# Patient Record
Sex: Male | Born: 1975
Health system: Southern US, Community
[De-identification: ages and names within clinical notes are randomized; demographics above are authoritative.]

## PROBLEM LIST (undated history)

## (undated) DIAGNOSIS — F419 Anxiety disorder, unspecified: Secondary | ICD-10-CM

## (undated) DIAGNOSIS — F1010 Alcohol abuse, uncomplicated: Secondary | ICD-10-CM

## (undated) DIAGNOSIS — N289 Disorder of kidney and ureter, unspecified: Secondary | ICD-10-CM

## (undated) DIAGNOSIS — J9600 Acute respiratory failure, unspecified whether with hypoxia or hypercapnia: Secondary | ICD-10-CM

## (undated) DIAGNOSIS — K219 Gastro-esophageal reflux disease without esophagitis: Secondary | ICD-10-CM

## (undated) DIAGNOSIS — E669 Obesity, unspecified: Secondary | ICD-10-CM

## (undated) DIAGNOSIS — I509 Heart failure, unspecified: Secondary | ICD-10-CM

## (undated) DIAGNOSIS — R739 Hyperglycemia, unspecified: Secondary | ICD-10-CM

## (undated) DIAGNOSIS — Z87820 Personal history of traumatic brain injury: Secondary | ICD-10-CM

## (undated) DIAGNOSIS — J45909 Unspecified asthma, uncomplicated: Secondary | ICD-10-CM

## (undated) DIAGNOSIS — G4730 Sleep apnea, unspecified: Secondary | ICD-10-CM

## (undated) DIAGNOSIS — J449 Chronic obstructive pulmonary disease, unspecified: Secondary | ICD-10-CM

## (undated) DIAGNOSIS — M2241 Chondromalacia patellae, right knee: Secondary | ICD-10-CM

## (undated) DIAGNOSIS — K2970 Gastritis, unspecified, without bleeding: Secondary | ICD-10-CM

## (undated) DIAGNOSIS — J410 Simple chronic bronchitis: Secondary | ICD-10-CM

## (undated) DIAGNOSIS — M23009 Cystic meniscus, unspecified meniscus, unspecified knee: Secondary | ICD-10-CM

## (undated) DIAGNOSIS — E119 Type 2 diabetes mellitus without complications: Secondary | ICD-10-CM

## (undated) DIAGNOSIS — I10 Essential (primary) hypertension: Secondary | ICD-10-CM

## (undated) HISTORY — PX: TONSILLECTOMY AND ADENOIDECTOMY: SHX28

## (undated) HISTORY — PX: WISDOM TOOTH EXTRACTION: SHX21

## (undated) HISTORY — PX: TONSILLECTOMY: SUR1361

## (undated) HISTORY — PX: APPENDECTOMY: SHX54

---

## 1999-02-10 ENCOUNTER — Encounter: Payer: Self-pay | Admitting: Internal Medicine

## 1999-02-10 ENCOUNTER — Inpatient Hospital Stay (HOSPITAL_COMMUNITY): Admission: EM | Admit: 1999-02-10 | Discharge: 1999-02-11 | Payer: Self-pay | Admitting: Gastroenterology

## 2000-03-11 ENCOUNTER — Inpatient Hospital Stay (HOSPITAL_COMMUNITY): Admission: EM | Admit: 2000-03-11 | Discharge: 2000-03-13 | Payer: Self-pay | Admitting: Family Medicine

## 2003-11-13 ENCOUNTER — Emergency Department (HOSPITAL_COMMUNITY): Admission: EM | Admit: 2003-11-13 | Discharge: 2003-11-13 | Payer: Self-pay | Admitting: Emergency Medicine

## 2004-06-05 ENCOUNTER — Emergency Department (HOSPITAL_COMMUNITY): Admission: EM | Admit: 2004-06-05 | Discharge: 2004-06-05 | Payer: Self-pay | Admitting: Emergency Medicine

## 2004-10-10 DIAGNOSIS — Z87820 Personal history of traumatic brain injury: Secondary | ICD-10-CM

## 2004-10-10 HISTORY — DX: Personal history of traumatic brain injury: Z87.820

## 2004-12-26 ENCOUNTER — Emergency Department (HOSPITAL_COMMUNITY): Admission: EM | Admit: 2004-12-26 | Discharge: 2004-12-26 | Payer: Self-pay | Admitting: Emergency Medicine

## 2005-09-17 ENCOUNTER — Emergency Department (HOSPITAL_COMMUNITY): Admission: EM | Admit: 2005-09-17 | Discharge: 2005-09-17 | Payer: Self-pay | Admitting: Emergency Medicine

## 2005-09-18 ENCOUNTER — Emergency Department (HOSPITAL_COMMUNITY): Admission: EM | Admit: 2005-09-18 | Discharge: 2005-09-18 | Payer: Self-pay | Admitting: Emergency Medicine

## 2005-09-22 ENCOUNTER — Emergency Department (HOSPITAL_COMMUNITY): Admission: EM | Admit: 2005-09-22 | Discharge: 2005-09-22 | Payer: Self-pay | Admitting: Emergency Medicine

## 2005-09-30 ENCOUNTER — Emergency Department (HOSPITAL_COMMUNITY): Admission: EM | Admit: 2005-09-30 | Discharge: 2005-09-30 | Payer: Self-pay | Admitting: Emergency Medicine

## 2006-08-21 ENCOUNTER — Emergency Department (HOSPITAL_COMMUNITY): Admission: EM | Admit: 2006-08-21 | Discharge: 2006-08-21 | Payer: Self-pay | Admitting: Emergency Medicine

## 2007-09-04 ENCOUNTER — Emergency Department (HOSPITAL_COMMUNITY): Admission: EM | Admit: 2007-09-04 | Discharge: 2007-09-04 | Payer: Self-pay | Admitting: Emergency Medicine

## 2008-01-12 ENCOUNTER — Emergency Department (HOSPITAL_COMMUNITY): Admission: EM | Admit: 2008-01-12 | Discharge: 2008-01-12 | Payer: Self-pay | Admitting: Emergency Medicine

## 2008-01-13 ENCOUNTER — Emergency Department (HOSPITAL_COMMUNITY): Admission: EM | Admit: 2008-01-13 | Discharge: 2008-01-13 | Payer: Self-pay | Admitting: Emergency Medicine

## 2008-02-26 ENCOUNTER — Emergency Department (HOSPITAL_COMMUNITY): Admission: EM | Admit: 2008-02-26 | Discharge: 2008-02-26 | Payer: Self-pay | Admitting: Emergency Medicine

## 2008-02-27 ENCOUNTER — Inpatient Hospital Stay (HOSPITAL_COMMUNITY): Admission: AD | Admit: 2008-02-27 | Discharge: 2008-03-01 | Payer: Self-pay | Admitting: Orthopedic Surgery

## 2008-02-27 HISTORY — PX: IRRIGATION AND DEBRIDEMENT ABSCESS: SHX5252

## 2008-02-27 HISTORY — PX: TENOSYNOVECTOMY: SHX6110

## 2008-02-27 HISTORY — PX: FOREIGN BODY REMOVAL: SHX962

## 2008-03-01 HISTORY — PX: INCISION AND DRAINAGE ABSCESS: SHX5864

## 2009-03-20 ENCOUNTER — Ambulatory Visit: Payer: Self-pay | Admitting: Diagnostic Radiology

## 2009-03-20 ENCOUNTER — Emergency Department (HOSPITAL_BASED_OUTPATIENT_CLINIC_OR_DEPARTMENT_OTHER): Admission: EM | Admit: 2009-03-20 | Discharge: 2009-03-20 | Payer: Self-pay | Admitting: Emergency Medicine

## 2009-03-26 ENCOUNTER — Emergency Department (HOSPITAL_BASED_OUTPATIENT_CLINIC_OR_DEPARTMENT_OTHER): Admission: EM | Admit: 2009-03-26 | Discharge: 2009-03-26 | Payer: Self-pay | Admitting: Emergency Medicine

## 2009-03-26 ENCOUNTER — Ambulatory Visit: Payer: Self-pay | Admitting: Radiology

## 2009-07-23 ENCOUNTER — Ambulatory Visit: Payer: Self-pay | Admitting: Diagnostic Radiology

## 2009-07-23 ENCOUNTER — Emergency Department (HOSPITAL_BASED_OUTPATIENT_CLINIC_OR_DEPARTMENT_OTHER): Admission: EM | Admit: 2009-07-23 | Discharge: 2009-07-23 | Payer: Self-pay | Admitting: Emergency Medicine

## 2009-11-20 ENCOUNTER — Ambulatory Visit: Payer: Self-pay | Admitting: Diagnostic Radiology

## 2009-11-20 ENCOUNTER — Emergency Department (HOSPITAL_BASED_OUTPATIENT_CLINIC_OR_DEPARTMENT_OTHER): Admission: EM | Admit: 2009-11-20 | Discharge: 2009-11-20 | Payer: Self-pay | Admitting: Emergency Medicine

## 2009-11-30 ENCOUNTER — Ambulatory Visit: Payer: Self-pay | Admitting: Diagnostic Radiology

## 2009-11-30 ENCOUNTER — Emergency Department (HOSPITAL_BASED_OUTPATIENT_CLINIC_OR_DEPARTMENT_OTHER): Admission: EM | Admit: 2009-11-30 | Discharge: 2009-11-30 | Payer: Self-pay | Admitting: Emergency Medicine

## 2010-10-06 ENCOUNTER — Emergency Department (HOSPITAL_BASED_OUTPATIENT_CLINIC_OR_DEPARTMENT_OTHER)
Admission: EM | Admit: 2010-10-06 | Discharge: 2010-10-06 | Payer: Self-pay | Source: Home / Self Care | Admitting: Emergency Medicine

## 2010-12-20 LAB — COMPREHENSIVE METABOLIC PANEL
Albumin: 4.6 g/dL (ref 3.5–5.2)
CO2: 26 mEq/L (ref 19–32)
Chloride: 105 mEq/L (ref 96–112)
Creatinine, Ser: 1.2 mg/dL (ref 0.4–1.5)
Glucose, Bld: 170 mg/dL — ABNORMAL HIGH (ref 70–99)
Total Bilirubin: 0.6 mg/dL (ref 0.3–1.2)
Total Protein: 7.9 g/dL (ref 6.0–8.3)

## 2010-12-20 LAB — DIFFERENTIAL
Basophils Absolute: 0 10*3/uL (ref 0.0–0.1)
Basophils Relative: 0 % (ref 0–1)
Lymphocytes Relative: 6 % — ABNORMAL LOW (ref 12–46)
Lymphs Abs: 1.1 10*3/uL (ref 0.7–4.0)
Neutro Abs: 14.3 10*3/uL — ABNORMAL HIGH (ref 1.7–7.7)

## 2010-12-20 LAB — CBC
HCT: 47 % (ref 39.0–52.0)
Hemoglobin: 16.6 g/dL (ref 13.0–17.0)
RDW: 12.7 % (ref 11.5–15.5)
WBC: 16.7 10*3/uL — ABNORMAL HIGH (ref 4.0–10.5)

## 2010-12-29 LAB — COMPREHENSIVE METABOLIC PANEL
ALT: 24 U/L (ref 0–53)
ALT: 25 U/L (ref 0–53)
AST: 18 U/L (ref 0–37)
Albumin: 4.6 g/dL (ref 3.5–5.2)
Albumin: 4.8 g/dL (ref 3.5–5.2)
Alkaline Phosphatase: 85 U/L (ref 39–117)
BUN: 14 mg/dL (ref 6–23)
BUN: 15 mg/dL (ref 6–23)
Calcium: 9.5 mg/dL (ref 8.4–10.5)
Chloride: 103 mEq/L (ref 96–112)
Chloride: 104 mEq/L (ref 96–112)
GFR calc Af Amer: >60 mL/min (ref >60)
GFR calc non Af Amer: >60 mL/min (ref >60)
GFR calc non Af Amer: >60 mL/min (ref >60)
Glucose, Bld: 154 mg/dL — ABNORMAL HIGH (ref 70–99)
Potassium: 4.1 mEq/L (ref 3.5–5.1)
Total Protein: 8.1 g/dL (ref 6.0–8.3)

## 2010-12-29 LAB — DIFFERENTIAL
Basophils Absolute: 0 10*3/uL (ref 0.0–0.1)
Basophils Absolute: 0.1 10*3/uL (ref 0.0–0.1)
Lymphocytes Relative: 10 % — ABNORMAL LOW (ref 12–46)
Lymphs Abs: 1.2 10*3/uL (ref 0.7–4.0)
Lymphs Abs: 2.1 10*3/uL (ref 0.7–4.0)
Monocytes Absolute: 0.6 10*3/uL (ref 0.1–1.0)
Monocytes Relative: 5 % (ref 3–12)
Neutro Abs: 10.9 10*3/uL — ABNORMAL HIGH (ref 1.7–7.7)
Neutro Abs: 9.8 10*3/uL — ABNORMAL HIGH (ref 1.7–7.7)
Neutrophils Relative %: 79 % — ABNORMAL HIGH (ref 43–77)
Neutrophils Relative %: 85 % — ABNORMAL HIGH (ref 43–77)

## 2010-12-29 LAB — CBC
Hemoglobin: 16.6 g/dL (ref 13.0–17.0)
MCHC: 35 g/dL (ref 30.0–36.0)
MCV: 88.5 fL (ref 78.0–100.0)
Platelets: 289 10*3/uL (ref 150–400)
RBC: 5.3 MIL/uL (ref 4.22–5.81)
RBC: 5.35 MIL/uL (ref 4.22–5.81)
RDW: 12 % (ref 11.5–15.5)
RDW: 12 % (ref 11.5–15.5)
WBC: 11.6 10*3/uL — ABNORMAL HIGH (ref 4.0–10.5)

## 2010-12-29 LAB — URINE MICROSCOPIC-ADD ON

## 2010-12-29 LAB — URINALYSIS, ROUTINE W REFLEX MICROSCOPIC
Bilirubin Urine: NEGATIVE
Glucose, UA: NEGATIVE mg/dL
Hgb urine dipstick: NEGATIVE
Protein, ur: NEGATIVE mg/dL
Specific Gravity, Urine: 1.025 (ref 1.005–1.030)
Specific Gravity, Urine: 1.031 — ABNORMAL HIGH (ref 1.005–1.030)
pH: 7.5 (ref 5.0–8.0)

## 2010-12-29 LAB — LIPASE, BLOOD: Lipase: 63 U/L (ref 23–300)

## 2011-01-13 LAB — COMPREHENSIVE METABOLIC PANEL
ALT: 22 U/L (ref 0–53)
Albumin: 5 g/dL (ref 3.5–5.2)
Alkaline Phosphatase: 82 U/L (ref 39–117)
Chloride: 105 mEq/L (ref 96–112)
Glucose, Bld: 144 mg/dL — ABNORMAL HIGH (ref 70–99)
Potassium: 3.8 mEq/L (ref 3.5–5.1)
Sodium: 146 mEq/L — ABNORMAL HIGH (ref 135–145)
Total Protein: 8 g/dL (ref 6.0–8.3)

## 2011-01-13 LAB — URINALYSIS, ROUTINE W REFLEX MICROSCOPIC
Bilirubin Urine: NEGATIVE
Glucose, UA: NEGATIVE mg/dL
Ketones, ur: >80 mg/dL — AB
pH: 7 (ref 5.0–8.0)

## 2011-01-13 LAB — ETHANOL: Alcohol, Ethyl (B): 20 mg/dL — ABNORMAL HIGH (ref 0–10)

## 2011-01-13 LAB — POCT TOXICOLOGY PANEL

## 2011-01-13 LAB — CBC
Hemoglobin: 16.4 g/dL (ref 13.0–17.0)
Platelets: 285 10*3/uL (ref 150–400)
RDW: 11.9 % (ref 11.5–15.5)
WBC: 17.9 10*3/uL — ABNORMAL HIGH (ref 4.0–10.5)

## 2011-01-13 LAB — DIFFERENTIAL
Basophils Relative: 1 % (ref 0–1)
Eosinophils Absolute: 0 10*3/uL (ref 0.0–0.7)
Lymphs Abs: 1.1 10*3/uL (ref 0.7–4.0)
Monocytes Absolute: 0.7 10*3/uL (ref 0.1–1.0)
Neutro Abs: 15.9 10*3/uL — ABNORMAL HIGH (ref 1.7–7.7)

## 2011-01-13 LAB — URINE MICROSCOPIC-ADD ON

## 2011-02-22 IMAGING — CR DG RIBS W/ CHEST 3+V*L*
4 series · 4 of 4 positions shown · non-contrast
Comparison: Chest x-ray dated 10/04/2007

CLINICAL DATA: The patient fell yesterday and has abrasions
inferior to the left nipple

LEFT RIBS AND CHEST - 3+ VIEW

[w chest pa]
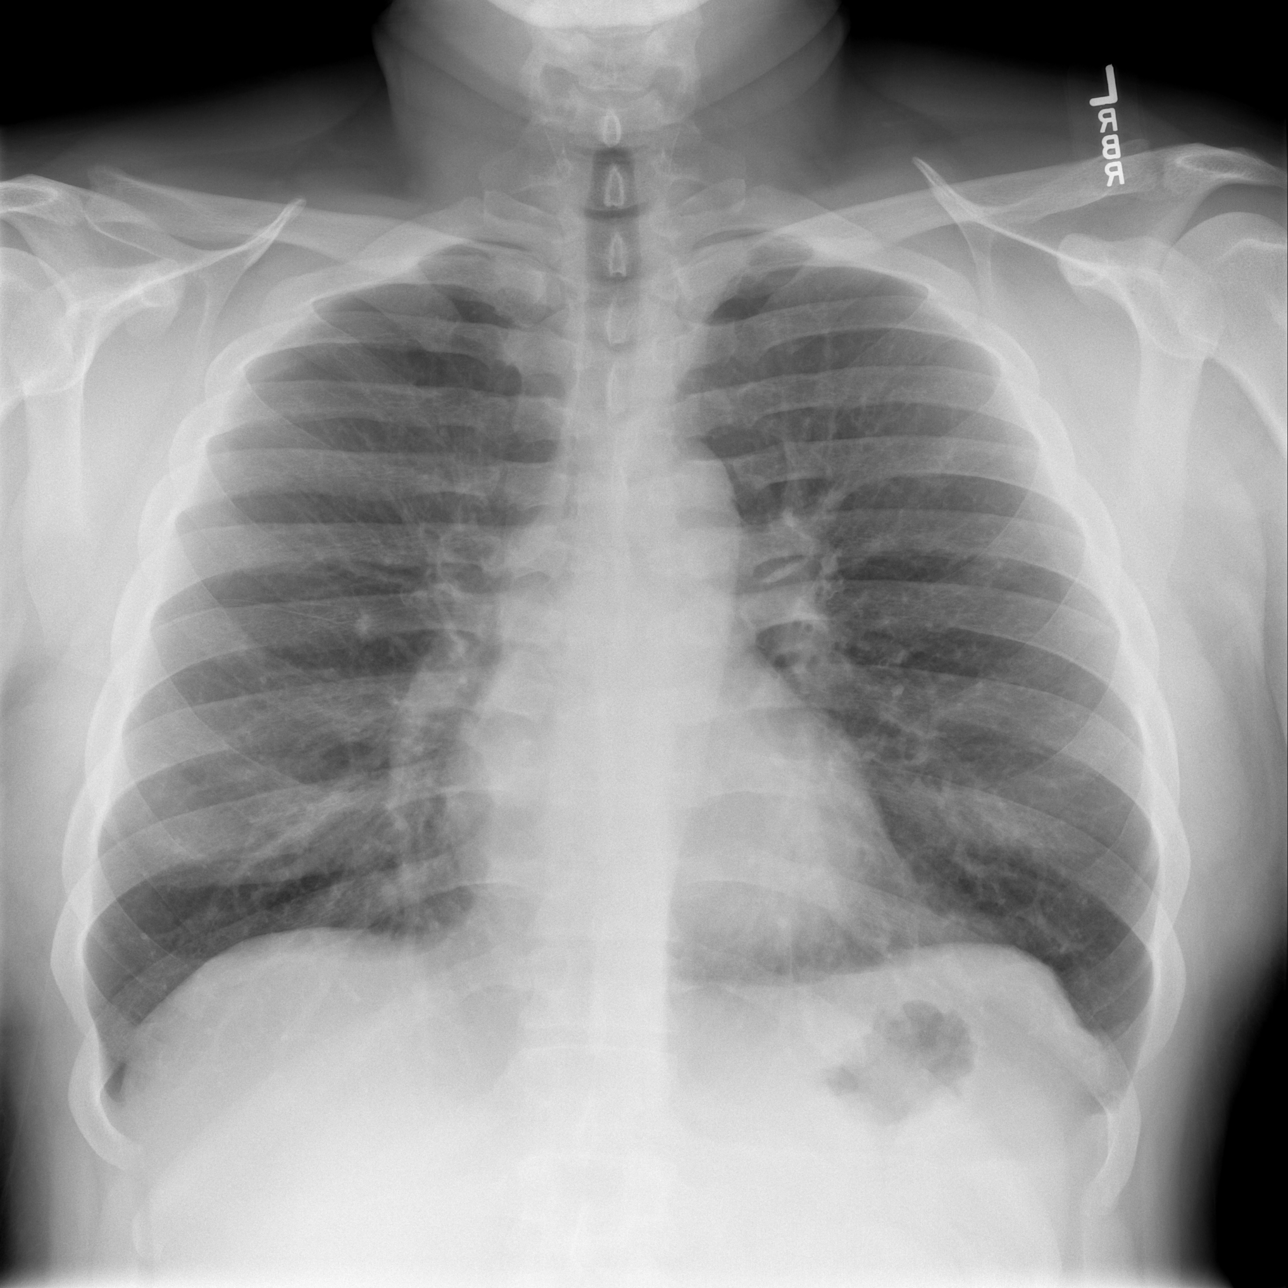

[w ribs ap/pa upper left]
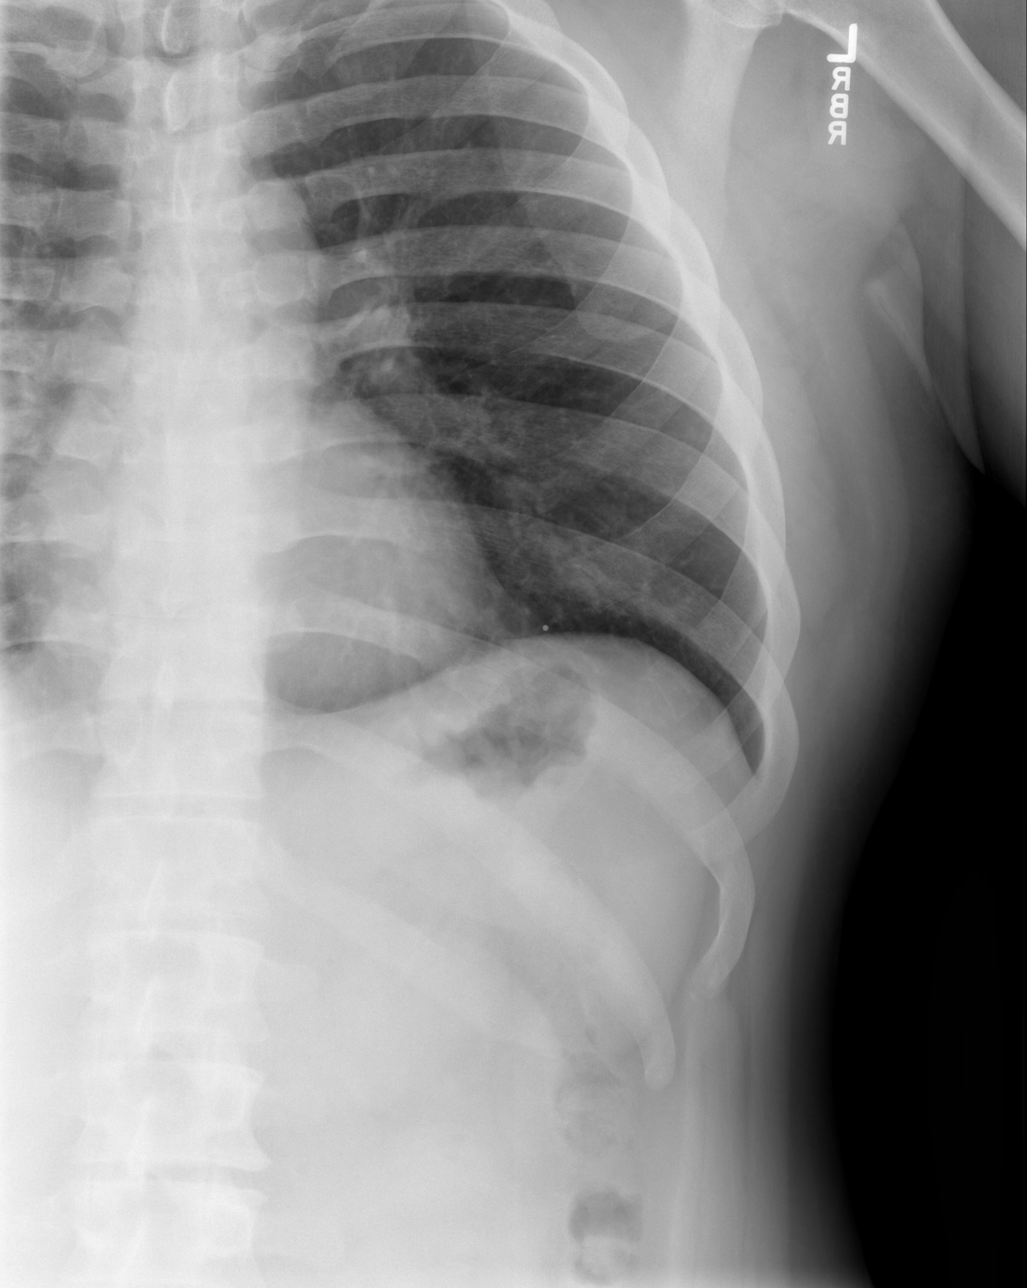

[w ribs oblique left (1 of 2)]
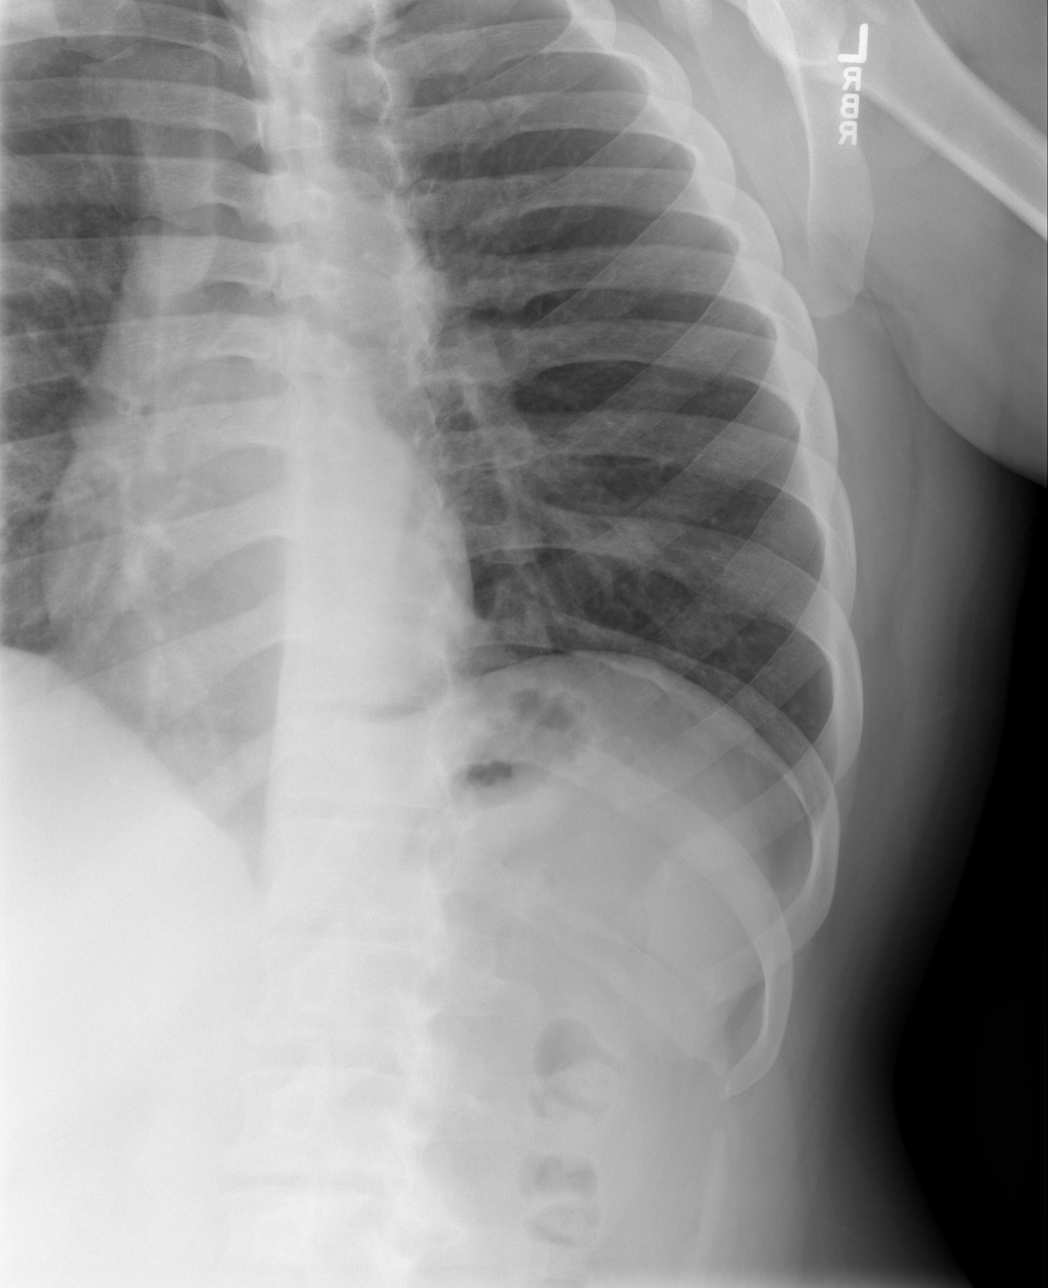

[w ribs oblique left (2 of 2)]
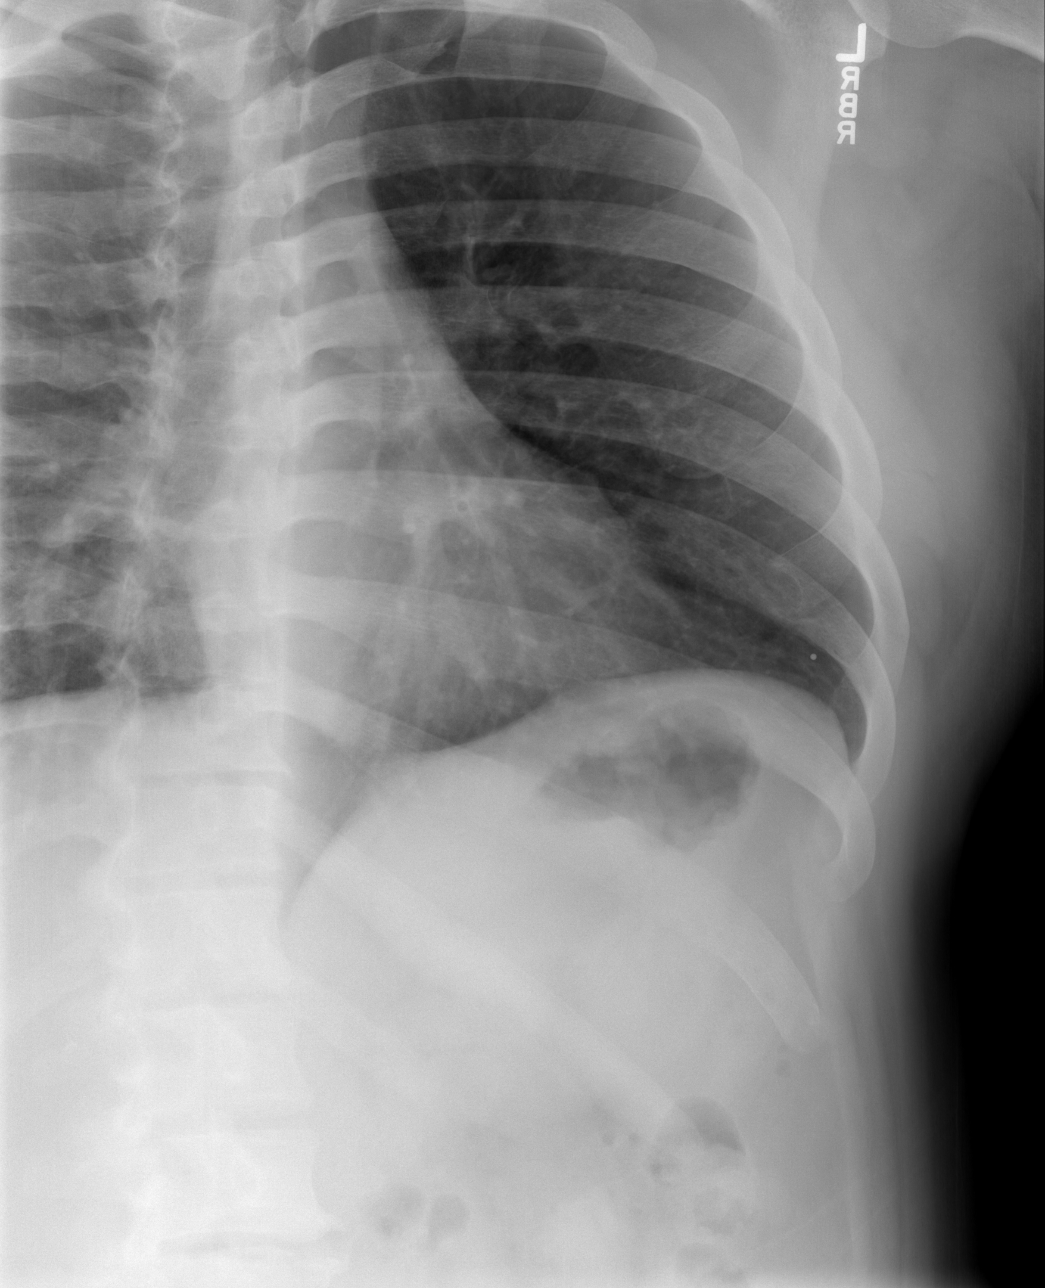

[4 of 4 positions shown; findings below may reference images not displayed]

FINDINGS: There is no evidence of rib fracture, lung contusion,
pneumothorax, or other significant abnormality. The heart size and
vascularity are normal and the lungs are clear.
IMPRESSION: Normal chest and left ribs.

## 2011-02-22 IMAGING — CR DG KNEE COMPLETE 4+V*L*
4 series · 4 of 4 positions shown · non-contrast
Comparison: None

CLINICAL DATA: Left knee pain with laceration and puncture wounds
to the anterior aspect of the knee.  Limited range of motion.  The
patient fell yesterday.

LEFT KNEE - COMPLETE 4+ VIEW

[t knee lat left (1 of 2)]
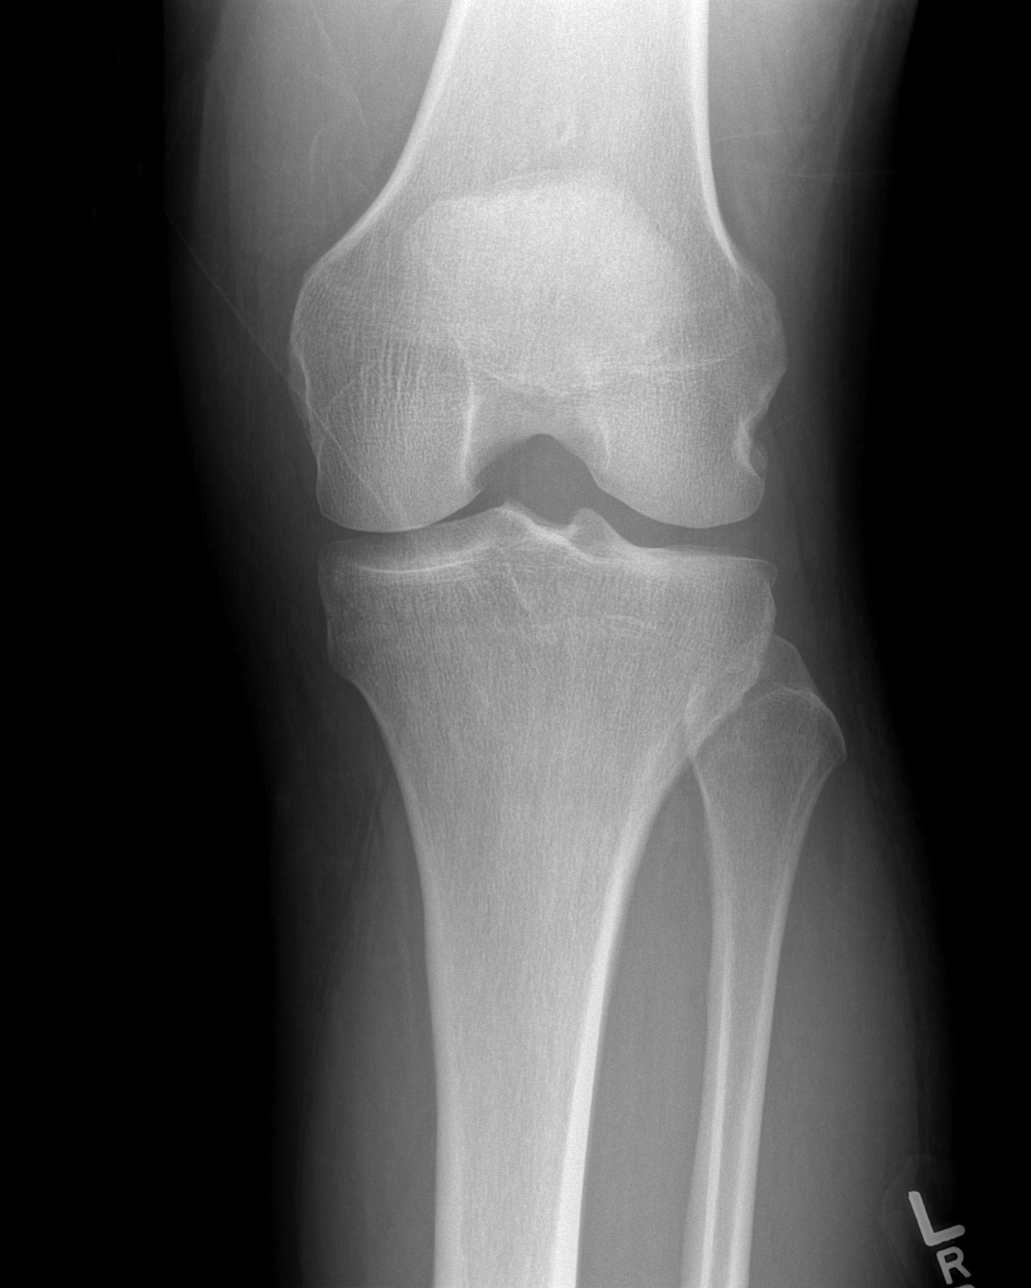

[t knee oblique left (1 of 2)]
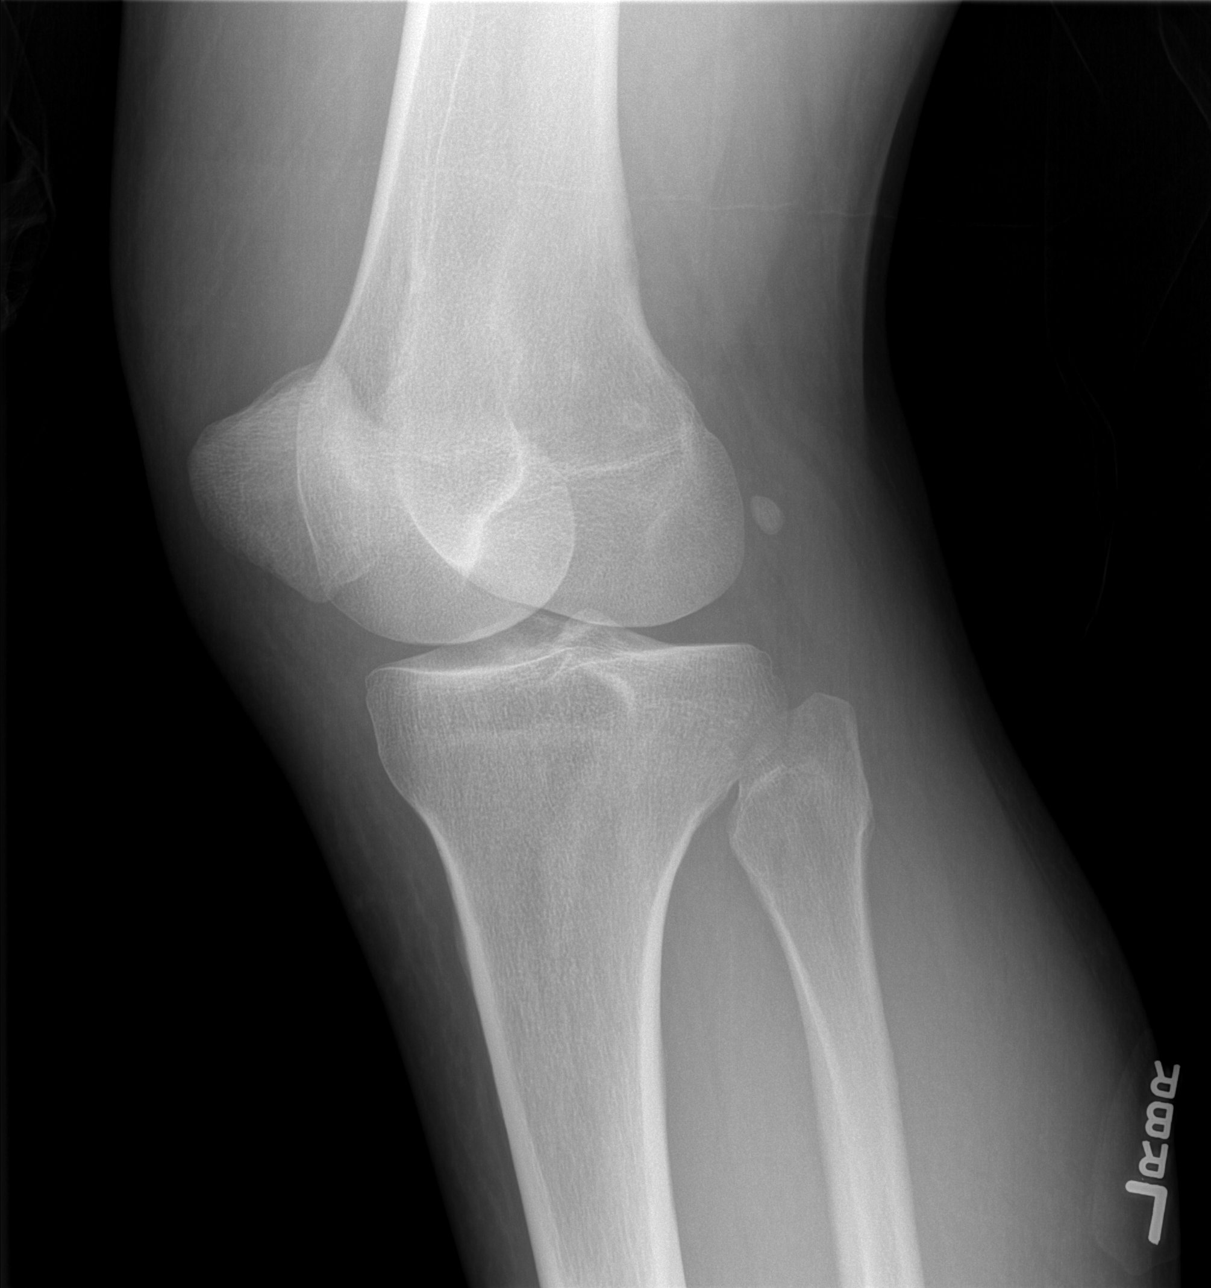

[t knee oblique left (2 of 2)]
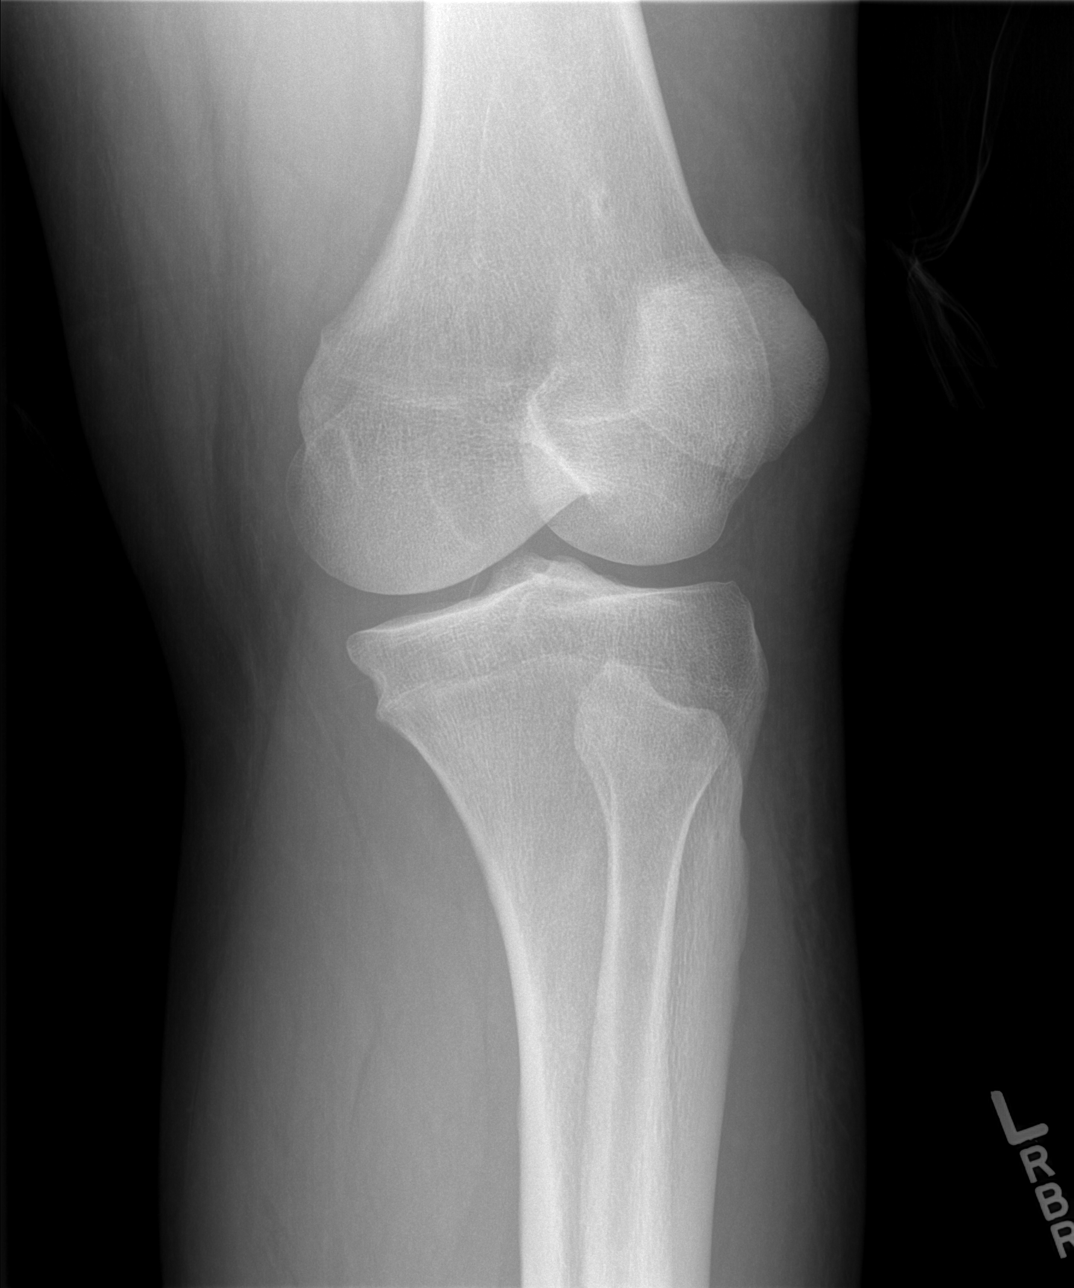

[t knee lat left (2 of 2)]
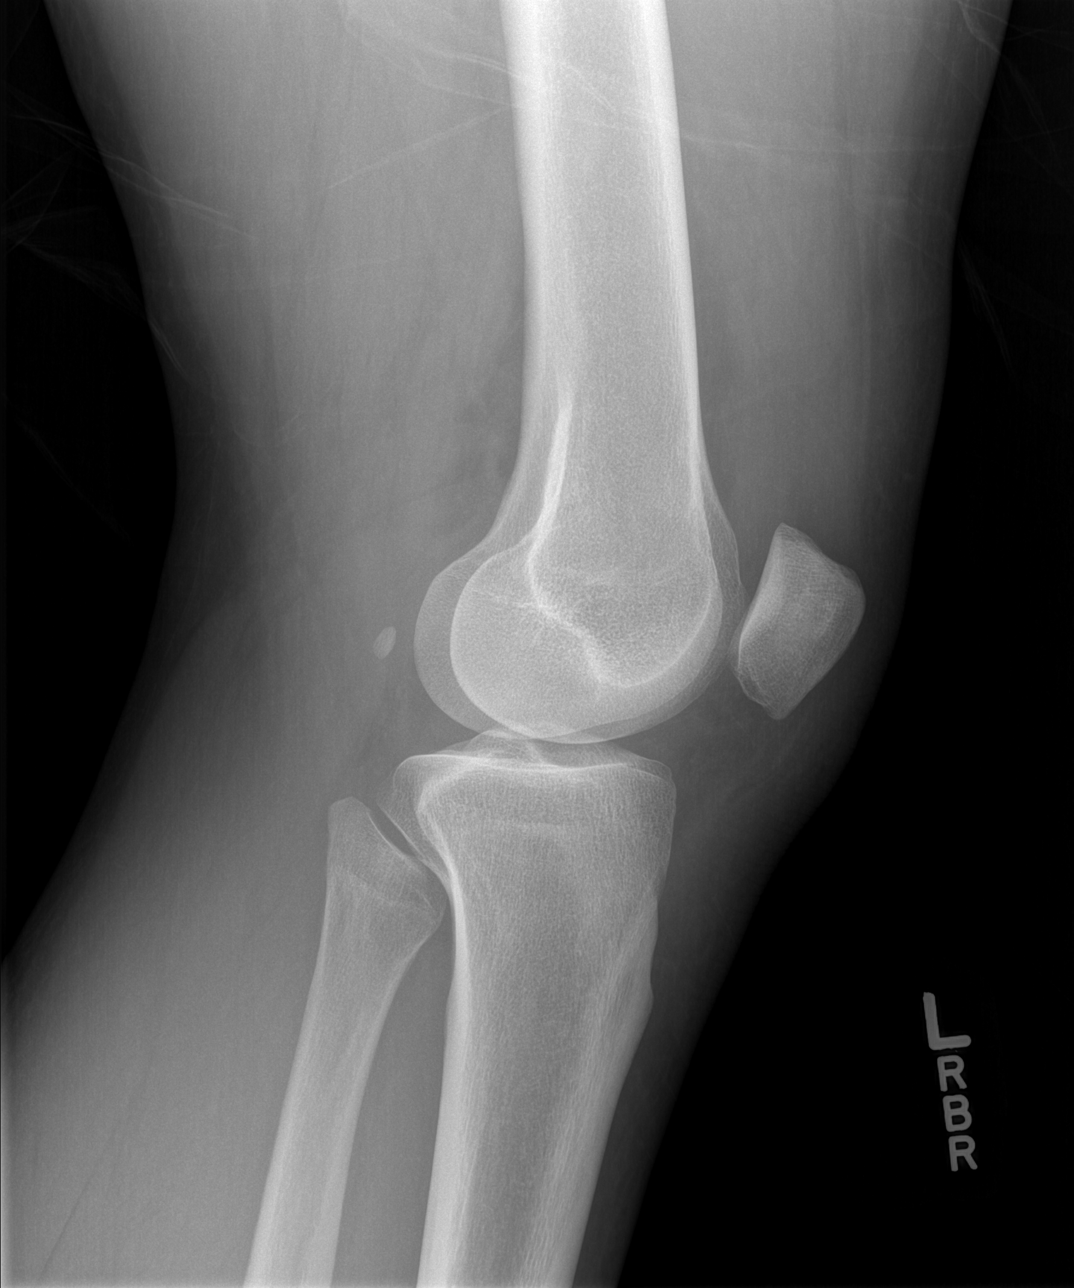

[4 of 4 positions shown; findings below may reference images not displayed]

FINDINGS: There is no bony abnormality or radiodense foreign body.
However, there is a joint effusion.
IMPRESSION: Joint effusion.  No bony abnormality or radiodense foreign body.

## 2011-02-22 NOTE — Op Note (Signed)
Steven Dudley, Steven Dudley NO.:  [id number]   MEDICAL RECORD NO.:  [id number]          PATIENT TYPE:  INP   LOCATION:  0098                         FACILITY:  [facility]   PHYSICIAN:  Steven Dudley, M.D.DATE OF BIRTH:  Feb 13, 1976   DATE OF PROCEDURE:  DATE OF DISCHARGE:                               OPERATIVE REPORT   PREOPERATIVE DIAGNOSES:  Left hand deep abscess with purulent flexor  tenosynovitis.   POSTOPERATIVE DIAGNOSES:  Left hand deep abscess with purulent flexor  tenosynovitis and noted 1-inch foreign body in the form of wood.   SURGICAL PROCEDURE PERFORMED:  1. Irrigation and debridement of skin, subcutaneous tissue and muscle      tendon sheath.  This was an excisional debridement.  2. Removal of large foreign body 1-inch.  3. Flexor tenosynovectomy left hand about the middle finger, including      flexor digitorum profundus and flexor digitorum superficialis      tendons with associated A1 pulley release.   SURGEON:  Steven Dudley, M.D.   ASSISTANT:  None.   COMPLICATIONS:  None.   ANESTHESIA:  General.   TOURNIQUET TIME:  Less than an hour.   INDICATIONS FOR PROCEDURE:  This patient was seen in the emergency room  yesterday.  Antibiotics were begun.  He has continued to worsen  dramatically and presented for my evaluation.  I immediately discussed  with the patient his findings and the urgency of getting him to the  operative theater quickly.  He was noted to have a white count of  17,000, significant pain, swelling, ascending cellulitis and worsening  parameters.  He was counseled, permit was signed, timeout was called was  he was taken to the operative suite.   OPERATIVE PROCEDURE:  In the operative suite, the patient underwent a  timeout under general anesthesia by Dr. Lucille Dudley.  Following  this, he was prepped and draped in the usual sterile fashion about the  left upper extremity with Betadine scrub and paint.  Once this  done, I  made a modified Brunner incision with extension into the palm.  I  identified a large foreign body and tracked this down, where it appears  the flexor tendon sheath.  It had also entered the third web space.  I  removed this in detail.  This was a very large wooden object and  previously had been coated with an oil based paint according to patient  report.  Following removal of the foreign body, I performed I&D of the  skin, subcutaneous tissue, tendon and muscle tissue.  This was an  excisional debridement.  I then tracked the area in question and area of  trauma to the A1 pulley, and released the first A1 pulley, as well as  performed an extensive tenosynovectomy of the FDP and FDS tendons to the  middle finger.  Following this, 3 liters of saline was placed in the  wound.  The patient then had two drains placed and very loose closure to  allow for the egress of fluid.  He tolerated the procedure well.  There  were no complicating features.  Sterile dressing and volar plaster  splint were applied.   The patient will be monitored closely.  Admitted for IV antibiotics in  the form of vancomycin and a third generation cephalosporin.  I have  discussed with him the relevant issues, do's and don'ts, etc., and plans  for close observation and serial I&Ds.  I discussed with him all issues  and all questions have been encouraged and answered.  We will plan  aggressive approach to his infection which was quite dramatic.           ______________________________  Steven Dudley, M.D.     Steven Dudley  D:  02/27/2008  T:  02/27/2008  Job:  [id number]   cc:   [facility]

## 2011-02-22 NOTE — Op Note (Signed)
NAMEJULIA, KULZER NO.:  [id number]   MEDICAL RECORD NO.:  [id number]          PATIENT TYPE:  INP   LOCATION:  1508                         [facility]ACILITY:  [facility]   PHYSICIAN:  Dionne Ano. Gramig III, M.D.DATE OF BIRTH:  [birth year]   DATE OF PROCEDURE:  DATE OF DISCHARGE:                               OPERATIVE REPORT   PREOPERATIVE DIAGNOSIS:  Left hand purulent flexor tenosynovitis with a  deep abscess.   POSTOPERATIVE DIAGNOSIS:  Left hand purulent flexor tenosynovitis with a  deep abscess.   PROCEDURE:  I&D, deep abscess, left hand skin, subcutaneous tissue,  muscle and tendon structures.  This was a repeat washout.   SURGEON:  Dionne Ano. Amanda Pea, M.D.   ASSISTANT:  Karie Chimera, P.A.-C.   COMPLICATIONS:  None.   ANESTHESIA:  General.   TOURNIQUET TIME:  Less than 30 minutes.   INDICATIONS FOR PROCEDURE:  This patient is a 35 year old male who  presents with the above-mentioned diagnosis.  I have counseled him in  regard to risk and benefits of surgery including risk of infection,  bleeding, anesthesia, damage to normal structures and failure of surgery  to accomplish its intended goals of relieving symptoms and restoring  function.  With this in mind he desires to proceed.  All questions have  been encouraged and answered preoperatively.   OPERATIVE PROCEDURE:  The patient seen and consented, a permit was  signed, time-out was called.  He was given a general anesthetic and  following this prepped and draped in the usual sterile fashion about the  left upper extremity.  Once this was complete I then performed I&D of  skin, subcutaneous tissue, muscle and tendon tissue.  He had much  improved wound parameters, no signs of instability or advanced  reaccumulation of his infection.  The flexor tendon sheaths were cleaned  out nicely and greater than 3 L of irrigant were placed through the  wound.  The patient tolerated this well.  He was closed loosely  over  vessel loop drains and placed in a sterile dressing.  He tolerated the  procedure well, awoke from anesthesia and there were no complicating  features.  He will be continued on IV antibiotics.  We will await his  final cultures and, hopefully, see a continued improvement.  We will  likely plan for discharge in the upcoming 1-2 days, and I have discussed  this with him and his family at length.  It has been a pleasure to  participate in his care.  All questions have been encouraged and  answered.     Dionne Ano. Everlene Other, M.D.    [name]  D:  03/01/2008  T:  03/01/2008  Job:  [id number]

## 2011-02-25 NOTE — Discharge Summary (Signed)
Steven, Dudley NO.:  [id number]   MEDICAL RECORD NO.:  [id number]          PATIENT TYPE:  INP   LOCATION:  1508                         FACILITY:  [facility]   PHYSICIAN:  Dionne Ano. Gramig, M.D.DATE OF BIRTH:  1976-04-16   DATE OF ADMISSION:  02/27/2008  DATE OF DISCHARGE:  03/01/2008                               DISCHARGE SUMMARY   ADMITTING DIAGNOSES:  1. Purulent flexor tenosynovitis, including left ring finger and palm.  2. History of alcohol abuse.  3. History of tobacco abuse.  4. History of asthma.   DISCHARGE DIAGNOSES:  1. Purulent Flexor tenosynovitis, including left ring finger and palm,      improved.  2. History of alcohol abuse.  3. History of tobacco abuse.  4. History of asthma.   SURGEON:  Dionne Ano. Amanda Pea, M.D.   PROCEDURE:  Serial I&Ds of the left hand skin, subcutaneous muscle and  tendon sheath.   HISTORY OF PRESENT ILLNESS:  Steven Dudley is a 35 year old gentleman who  presented to the [facility] emergency room with increased pain,  swelling, redness and difficulty bending the fingers after an injury he  sustained, self-described as a puncture wound from painting.  He denied  any other injuries at the time.  On physical examination in the  emergency room setting, he was noted to have soft tissue swelling,  redness, ascending cellulitis and positive Kanavel sign, consistent with  purulent flexor tenosynovitis.  Given his examination, we discussed with  him the need to undergo operative intervention imminently.  He was  informed that this may be a several-day admit with possible repeat I&Ds  given the significant infective process.  Preoperative laboratory data  was obtained.  He was cleared to undergo surgery.  He was noted to have  an elevated white cell count at 17.4, consistent with the significant  infection.   HOSPITAL COURSE:  Steven Dudley was admitted Feb 27, 2008, and underwent  an I&D about the left hand.  Please see  operative report for full  details.  The patient was admitted to the orthopedic unit to undergo  daily wound care and was placed on appropriate pain management, as well  as IV antibiotics in the form of vancomycin and Zosyn, managed per  pharmacy protocol.  Postoperative day #1, he was doing better overall,  but tender as expected.  He denied any nausea, vomiting, fevers or  chills.  Vital signs were stable, but he was noted to have a temperature  99.  Cultures showed no organisms at the time.  His left upper extremity  sensation was grossly intact.  Mild dysesthesia was present.  Passive  range of motion was improved.  Active range of motion looked well, but  was mildly tender.  The middle finger was noted to have significant  erythema and swelling and pain with passive flexion and extension.  Given his examination and the continued significant erythema and  swelling, plans were made for a repeat I&D the following day.  He  tolerated this well.  There was no complications.  On Mar 01, 2008, he  was stable.  He  looked good overall.  His cultures showed no significant  growth.  He had continued IV antibiotics and had continued wound care.  The decision was made to discharge him home.   ASSESSMENT/FINAL DIAGNOSIS:  Please see discharge diagnoses.   CONDITION ON DISCHARGE:  Improved.   DIET:  Regular.   ACTIVITIES:  Will keep the area clean, dry and intact.   We have discussed with him wet-to-dry dressing changes.  Will have him  follow-up in our office on Mar 05, 2008.  He will call for any questions  or concerns.   DISCHARGE MEDICATIONS:  1. Keflex 500 mg 1 p.o. q.i.d. x10 days.  2. Bactrim DS 1 p.o. b.i.d. x 10 days.  3. Percocet 1-2 q.4-6 p.r.n. pain.  4. Over-the-counter Peri-Colace was recommended, as well as vitamin C.      Karie Chimera, P.A.-C.    ______________________________  Dionne Ano. Amanda Pea, M.D.    BB/MEDQ  D:  05/08/2008  T:  05/08/2008  Job:  [id number]

## 2011-02-25 NOTE — Discharge Summary (Signed)
[facility]. [facility]  Patient:    Steven Dudley, Steven Dudley                     MRN: [id number] Adm. Date:  [id number] Disc. Date: [id number] Attending:  Lanell Persons                           Discharge Summary  DATE OF BIRTH:  1976/03/03.  REASON FOR HOSPITALIZATION:  Protracted emesis.  HISTORY OF PRESENT ILLNESS:  The patient is a 35 year old male who had protracted emesis for approximately two days prior to admission.  He failed outpatient therapy and was seen at the walk-in clinic at [facility] and because of protracted symptoms, inability to tolerate p.o., he was admitted to the hospital for more aggressive treatment and rehydration.  Please see the admission H&P by me for details of the admission.  HOSPITAL COURSE:  Through the weekend, the patient was treated with IV fluids and antiemetic types of medications to include Phenergan.  He also had protracted hiccups and was given Thorazine to help resolve that symptom, which did resolve.  The patient gradually improved to tolerate a clear liquid diet and ultimately improved to the point where he could be discharged to home. During his admission he was noted, at least by his report, to have some bloody emesis which resolved, that was likely thought to be a Mallory-Weiss tear.  LABS ON ADMISSION:  His white count was 15.5, hematocrit 50, platelets 325. His BMET was significant for a potassium of 3.4.  All other values were essentially normal.  BMET was dated, at least in the record, March 12, 2000.  By March 13, 2000, his white count had come down to 10.3, his hematocrit was 41.3 and his BMET was within normal limits.  His acute abdominal series showed no active process.  DISPOSITION:  On March 13, 2000, he was felt stable for discharge and was discharged to home in good condition.  His nausea and vomiting had resolved and he was tolerating soft foods and Jello.  DISCHARGE MEDICATIONS:  Phenergan  tablets 25 mg q.4h. p.r.n. He may also use rectal suppositories of Phenergan.  FOLLOW-UP APPOINTMENT:  [facility] on an as needed basis.  SPECIAL INSTRUCTIONS:  Drink fluids as tolerated with continuing to advance diet to regular.  Follow-up if there are any problems with this.  FINAL DIAGNOSIS:  Likely viral syndrome with protracted nausea and vomiting and dehydration which was resolved with IV hydration and antiemetic therapy.  DISCHARGE CONDITION:  The patient was discharged in improved condition with follow-up as described above.  By the time of his discharge, all of his symptoms had resolved. DD:  03/29/00 TD:  03/30/00 Job: [id number]

## 2011-07-05 LAB — COMPREHENSIVE METABOLIC PANEL
ALT: 18
ALT: 21
AST: 18
CO2: 26
CO2: 27
Calcium: 9.2
Chloride: 101
Chloride: 102
Creatinine, Ser: 0.92
Creatinine, Ser: 1.01
GFR calc Af Amer: >60
GFR calc non Af Amer: >60
GFR calc non Af Amer: >60
Glucose, Bld: 119 — ABNORMAL HIGH
Sodium: 139
Total Bilirubin: 0.8
Total Bilirubin: 0.9

## 2011-07-05 LAB — URINALYSIS, ROUTINE W REFLEX MICROSCOPIC
Bilirubin Urine: NEGATIVE
Ketones, ur: 40 — AB
Nitrite: NEGATIVE
Urobilinogen, UA: 0.2

## 2011-07-05 LAB — DIFFERENTIAL
Basophils Absolute: 0
Basophils Absolute: 0
Basophils Relative: 0
Eosinophils Absolute: 0
Eosinophils Absolute: 0
Eosinophils Relative: 0
Lymphocytes Relative: 9 — ABNORMAL LOW
Lymphs Abs: 2
Neutrophils Relative %: 78 — ABNORMAL HIGH

## 2011-07-05 LAB — CBC
Hemoglobin: 16.1
MCHC: 34.9
MCV: 85.4
MCV: 85.8
RBC: 5.38
RBC: 5.55
WBC: 13.4 — ABNORMAL HIGH

## 2011-07-05 LAB — LIPASE, BLOOD: Lipase: 22

## 2011-07-06 LAB — CBC
Hemoglobin: 15.2
MCHC: 34.1
RBC: 5.04
WBC: 17.4 — ABNORMAL HIGH

## 2011-07-06 LAB — BASIC METABOLIC PANEL
CO2: 29
Calcium: 8.9
Creatinine, Ser: 0.86
GFR calc Af Amer: >60
GFR calc non Af Amer: >60
Sodium: 137

## 2011-07-06 LAB — DIFFERENTIAL
Lymphocytes Relative: 12
Lymphs Abs: 2.1
Monocytes Absolute: 1.2 — ABNORMAL HIGH
Monocytes Relative: 7
Neutro Abs: 13.9 — ABNORMAL HIGH
Neutrophils Relative %: 80 — ABNORMAL HIGH

## 2011-07-06 LAB — ANAEROBIC CULTURE: Gram Stain: NONE SEEN

## 2011-07-06 LAB — WOUND CULTURE

## 2012-06-26 ENCOUNTER — Encounter (HOSPITAL_BASED_OUTPATIENT_CLINIC_OR_DEPARTMENT_OTHER): Payer: Self-pay | Admitting: *Deleted

## 2012-06-26 ENCOUNTER — Emergency Department (HOSPITAL_BASED_OUTPATIENT_CLINIC_OR_DEPARTMENT_OTHER): Payer: Self-pay

## 2012-06-26 ENCOUNTER — Emergency Department (HOSPITAL_BASED_OUTPATIENT_CLINIC_OR_DEPARTMENT_OTHER)
Admission: EM | Admit: 2012-06-26 | Discharge: 2012-06-26 | Disposition: A | Discharge status: Home / Self Care | Payer: Self-pay | Attending: Emergency Medicine | Admitting: Emergency Medicine

## 2012-06-26 DIAGNOSIS — R110 Nausea: Secondary | ICD-10-CM | POA: Insufficient documentation

## 2012-06-26 DIAGNOSIS — R109 Unspecified abdominal pain: Secondary | ICD-10-CM | POA: Insufficient documentation

## 2012-06-26 DIAGNOSIS — Z9089 Acquired absence of other organs: Secondary | ICD-10-CM | POA: Insufficient documentation

## 2012-06-26 HISTORY — DX: Gastritis, unspecified, without bleeding: K29.70

## 2012-06-26 LAB — CBC WITH DIFFERENTIAL/PLATELET
Basophils Relative: 0 % (ref 0–1)
Eosinophils Absolute: 0 10*3/uL (ref 0.0–0.7)
Eosinophils Relative: 0 % (ref 0–5)
Hemoglobin: 16.2 g/dL (ref 13.0–17.0)
Lymphs Abs: 2.5 10*3/uL (ref 0.7–4.0)
MCH: 30.1 pg (ref 26.0–34.0)
MCHC: 34.6 g/dL (ref 30.0–36.0)
MCV: 86.8 fL (ref 78.0–100.0)
Monocytes Absolute: 1.3 10*3/uL — ABNORMAL HIGH (ref 0.1–1.0)
Monocytes Relative: 9 % (ref 3–12)
Neutrophils Relative %: 75 % (ref 43–77)
RBC: 5.39 MIL/uL (ref 4.22–5.81)

## 2012-06-26 LAB — COMPREHENSIVE METABOLIC PANEL
ALT: 14 U/L (ref 0–53)
Albumin: 4.4 g/dL (ref 3.5–5.2)
Alkaline Phosphatase: 62 U/L (ref 39–117)
BUN: 11 mg/dL (ref 6–23)
Chloride: 98 mEq/L (ref 96–112)
GFR calc Af Amer: >90 mL/min (ref >90)
Glucose, Bld: 115 mg/dL — ABNORMAL HIGH (ref 70–99)
Potassium: 3.3 mEq/L — ABNORMAL LOW (ref 3.5–5.1)
Sodium: 138 mEq/L (ref 135–145)
Total Bilirubin: 0.6 mg/dL (ref 0.3–1.2)

## 2012-06-26 LAB — URINALYSIS, ROUTINE W REFLEX MICROSCOPIC
Hgb urine dipstick: NEGATIVE
Ketones, ur: 15 mg/dL — AB
Nitrite: NEGATIVE
Specific Gravity, Urine: 1.029 (ref 1.005–1.030)
pH: 6 (ref 5.0–8.0)

## 2012-06-26 LAB — LIPASE, BLOOD: Lipase: 21 U/L (ref 11–59)

## 2012-06-26 MED ORDER — ONDANSETRON 8 MG PO TBDP: 8.0000 mg | ORAL_TABLET | Freq: Three times a day (TID) | ORAL | Status: DC | PRN

## 2012-06-26 MED ORDER — SODIUM CHLORIDE 0.9 % IV SOLN
1000.0000 mL | INTRAVENOUS | Status: DC
  Administered 2012-06-26: 1000 mL via INTRAVENOUS

## 2012-06-26 MED ORDER — CIPROFLOXACIN HCL 500 MG PO TABS: 500.0000 mg | ORAL_TABLET | Freq: Two times a day (BID) | ORAL | Status: DC

## 2012-06-26 MED ORDER — ONDANSETRON HCL 4 MG/2ML IJ SOLN
4.0000 mg | Freq: Once | INTRAMUSCULAR | Status: AC
  Administered 2012-06-26: 4 mg via INTRAVENOUS
  Filled 2012-06-26: qty 2

## 2012-06-26 MED ORDER — IOHEXOL 300 MG/ML  SOLN
100.0000 mL | Freq: Once | INTRAMUSCULAR | Status: AC | PRN
  Administered 2012-06-26: 100 mL via INTRAVENOUS

## 2012-06-26 MED ORDER — SODIUM CHLORIDE 0.9 % IV SOLN
1000.0000 mL | Freq: Once | INTRAVENOUS | Status: AC
  Administered 2012-06-26: 1000 mL via INTRAVENOUS

## 2012-06-26 MED ORDER — HYDROMORPHONE HCL PF 1 MG/ML IJ SOLN
1.0000 mg | Freq: Once | INTRAMUSCULAR | Status: AC
  Administered 2012-06-26: 1 mg via INTRAVENOUS
  Filled 2012-06-26: qty 1

## 2012-06-26 MED ORDER — PROMETHAZINE HCL 25 MG PO TABS: 25.0000 mg | ORAL_TABLET | Freq: Four times a day (QID) | ORAL | Status: DC | PRN

## 2012-06-26 MED ORDER — PANTOPRAZOLE SODIUM 20 MG PO TBEC: 40.0000 mg | DELAYED_RELEASE_TABLET | Freq: Every day | ORAL | Status: DC

## 2012-06-26 NOTE — ED Notes (Signed)
No distress noted in Pt. And he is alert and oriented.  NO dry heaves in triage.

## 2012-06-26 NOTE — ED Notes (Signed)
Pt is unable to void at present time. 

## 2012-06-26 NOTE — ED Provider Notes (Addendum)
History     CSN: [id number]  Arrival date & time 06/26/12  1533   First MD Initiated Contact with Patient 06/26/12 1551      Chief Complaint  Patient presents with  . Nausea    Pt. reports nausea for 3 days and pain in his groin area.  Pt. reports dry heaving not real vomiting.     Patient is a 36 y.o. male presenting with abdominal pain. The history is provided by the patient.  Abdominal Pain The primary symptoms of the illness include abdominal pain. The current episode started more than 2 days ago. The onset of the illness was gradual. Progression since onset: Not getting better.  He tried to go to work today but had to leave.  The abdominal pain is located in the epigastric region, RUQ and LUQ. The abdominal pain does not radiate. The abdominal pain is relieved by nothing. The abdominal pain is exacerbated by vomiting.  Significant associated medical issues include GERD. Associated medical issues comments: Pt has had this before with his gastritis.  He has not had any diarrhea but has not been able to keep anything down..  THis AM he also experienced pain in his left testicle after all of the vomiting.  He thinks he may have pulled something.  NO discharge.  No hematuria or dysuria.  Past Medical History  Diagnosis Date  . Gastritis     History reviewed. No pertinent past surgical history. except appendicitis  No family history on file.  History  Substance Use Topics  . Smoking status: Not on file  . Smokeless tobacco: Not on file  . Alcohol Use:       Review of Systems  Gastrointestinal: Positive for abdominal pain.  All other systems reviewed and are negative.    Allergies  Review of patient's allergies indicates no known allergies.  Home Medications   Current Outpatient Rx  Name Route Sig Dispense Refill  . LORAZEPAM 0.5 MG PO TABS Oral Take 0.5 mg by mouth every 8 (eight) hours.      BP 184/117  Pulse 71  Temp 98.5 F (36.9 C)  Resp 18  Physical  Exam  Nursing note and vitals reviewed. Constitutional: He appears well-developed and well-nourished. No distress.  HENT:  Head: Normocephalic and atraumatic.  Right Ear: External ear normal.  Left Ear: External ear normal.  Eyes: Conjunctivae normal are normal. Right eye exhibits no discharge. Left eye exhibits no discharge. No scleral icterus.  Neck: Neck supple. No tracheal deviation present.  Cardiovascular: Normal rate, regular rhythm and intact distal pulses.   Pulmonary/Chest: Effort normal and breath sounds normal. No stridor. No respiratory distress. He has no wheezes. He has no rales.  Abdominal: Soft. Bowel sounds are normal. He exhibits no distension, no ascites and no mass. There is Tenderness: no tenderness to palpation, protuberant.. There is no rebound and no guarding.  Genitourinary: Left testis shows no mass and no swelling. Left testis is descended. Cremasteric reflex is not absent on the left side.       Mild ttp left epididymis   Musculoskeletal: He exhibits no edema and no tenderness.  Neurological: He is alert. He has normal strength. No sensory deficit. Cranial nerve deficit:  no gross defecits noted. He exhibits normal muscle tone. He displays no seizure activity. Coordination normal.  Skin: Skin is warm and dry. No rash noted.  Psychiatric: He has a normal mood and affect.    ED Course  Procedures (including critical care time)  Labs Reviewed  COMPREHENSIVE METABOLIC PANEL - Abnormal; Notable for the following:    Potassium 3.3 (*)     Glucose, Bld 115 (*)     All other components within normal limits  URINALYSIS, ROUTINE W REFLEX MICROSCOPIC - Abnormal; Notable for the following:    Color, Urine AMBER (*)  BIOCHEMICALS MAY BE AFFECTED BY COLOR   APPearance CLOUDY (*)     Bilirubin Urine SMALL (*)     Ketones, ur 15 (*)     All other components within normal limits  CBC WITH DIFFERENTIAL - Abnormal; Notable for the following:    WBC 15.6 (*)     Neutro  Abs 11.7 (*)     Monocytes Absolute 1.3 (*)     All other components within normal limits  LIPASE, BLOOD   Ct Abdomen Pelvis W Contrast  06/26/2012  *RADIOLOGY REPORT*  Clinical Data: 36 year old male abdominal pain left groin pain nausea.  Status post appendectomy.  CT ABDOMEN AND PELVIS WITH CONTRAST  Technique:  Multidetector CT imaging of the abdomen and pelvis was performed following the standard protocol during bolus administration of intravenous contrast.  Contrast: OMNIPAQUE IOHEXOL 300 MG/ML  SOLN  Comparison: 07/23/2009.  Findings: Minor atelectasis at the lung bases.  No pericardial or pleural effusion. No acute osseous abnormality identified.  Chronic L2-L3 disc degeneration, now with trace vacuum disc phenomena.  No pelvic free fluid.  Negative bladder.  Negative distal colon. Negative proximal colon.  Oral contrast has reached the terminal ileum, but not yet the ileocecal valve.  No dilated small bowel. Negative stomach, duodenum, gallbladder, spleen, pancreas, adrenal glands, kidneys, portal venous system, and major arterial structures.  No abdominal free fluid.  Evidence of generalized decreased liver density, no discrete liver lesion.  No lymphadenopathy.  No pneumoperitoneum.  IMPRESSION: No acute or inflammatory findings in the abdomen or pelvis. Suggestion of hepatic steatosis.   Original Report Authenticated By: Harley Hallmark, M.D.      1. Abdominal pain       MDM  The patient does not have evidence of bowel obstruction or other acute inflammatory changes on the CT scan. His labs are unremarkable other than his elevated white blood cell count. At this time there does not appear to be evidence of an acute emergency medical condition associated with some abdominal pain. Patient did experience left testicular pain after the onset of his nausea and vomiting.  I doubt that he is having issues with testicular torsion considering his exam and the fact it is testicular pain  started after he had multiple episodes of nausea and vomiting. Is possible this pain may be associated with muscle strain His blood pressure has improved with pain management. I will discharge the patient home with a referral to a gastroenterologist.I will have him  take antacids and anti-emetics       Celene Kras, MD 06/26/12 1945  Discussed findings with patient.  He did have some ttp on the left epididymis.  Will rx abx to cover for that.  Celene Kras, MD 06/26/12 [phone]

## 2012-06-26 NOTE — ED Notes (Signed)
Pt completed PO contrast

## 2012-06-26 NOTE — ED Notes (Signed)
Patient returned to room from CT. 

## 2012-06-26 NOTE — ED Notes (Signed)
Patient transported to CT 

## 2012-06-27 ENCOUNTER — Emergency Department (HOSPITAL_BASED_OUTPATIENT_CLINIC_OR_DEPARTMENT_OTHER): Payer: Self-pay

## 2012-06-27 ENCOUNTER — Emergency Department (HOSPITAL_BASED_OUTPATIENT_CLINIC_OR_DEPARTMENT_OTHER)
Admission: EM | Admit: 2012-06-27 | Discharge: 2012-06-27 | Disposition: A | Discharge status: Home / Self Care | Payer: Self-pay | Attending: Emergency Medicine | Admitting: Emergency Medicine

## 2012-06-27 ENCOUNTER — Encounter (HOSPITAL_BASED_OUTPATIENT_CLINIC_OR_DEPARTMENT_OTHER): Payer: Self-pay

## 2012-06-27 DIAGNOSIS — Z79899 Other long term (current) drug therapy: Secondary | ICD-10-CM | POA: Insufficient documentation

## 2012-06-27 DIAGNOSIS — R112 Nausea with vomiting, unspecified: Secondary | ICD-10-CM | POA: Insufficient documentation

## 2012-06-27 DIAGNOSIS — Z9089 Acquired absence of other organs: Secondary | ICD-10-CM | POA: Insufficient documentation

## 2012-06-27 DIAGNOSIS — E669 Obesity, unspecified: Secondary | ICD-10-CM | POA: Insufficient documentation

## 2012-06-27 DIAGNOSIS — R109 Unspecified abdominal pain: Secondary | ICD-10-CM | POA: Insufficient documentation

## 2012-06-27 LAB — CBC WITH DIFFERENTIAL/PLATELET
Basophils Absolute: 0 10*3/uL (ref 0.0–0.1)
HCT: 47 % (ref 39.0–52.0)
Hemoglobin: 16.6 g/dL (ref 13.0–17.0)
Lymphocytes Relative: 18 % (ref 12–46)
Lymphs Abs: 3 10*3/uL (ref 0.7–4.0)
MCV: 86.4 fL (ref 78.0–100.0)
Monocytes Absolute: 1.2 10*3/uL — ABNORMAL HIGH (ref 0.1–1.0)
Neutro Abs: 11.9 10*3/uL — ABNORMAL HIGH (ref 1.7–7.7)
RBC: 5.44 MIL/uL (ref 4.22–5.81)
RDW: 12.7 % (ref 11.5–15.5)
WBC: 16.1 10*3/uL — ABNORMAL HIGH (ref 4.0–10.5)

## 2012-06-27 LAB — COMPREHENSIVE METABOLIC PANEL
Albumin: 4.2 g/dL (ref 3.5–5.2)
Alkaline Phosphatase: 66 U/L (ref 39–117)
BUN: 9 mg/dL (ref 6–23)
Calcium: 9.7 mg/dL (ref 8.4–10.5)
Creatinine, Ser: 0.9 mg/dL (ref 0.50–1.35)
GFR calc Af Amer: >90 mL/min (ref >90)
Glucose, Bld: 134 mg/dL — ABNORMAL HIGH (ref 70–99)
Total Protein: 7.7 g/dL (ref 6.0–8.3)

## 2012-06-27 LAB — LIPASE, BLOOD: Lipase: 25 U/L (ref 11–59)

## 2012-06-27 MED ORDER — ONDANSETRON HCL 8 MG PO TABS: 8.0000 mg | ORAL_TABLET | Freq: Three times a day (TID) | ORAL | Status: DC | PRN

## 2012-06-27 MED ORDER — SODIUM CHLORIDE 0.9 % IV SOLN
Freq: Once | INTRAVENOUS | Status: AC
  Administered 2012-06-27: 16:00:00 via INTRAVENOUS

## 2012-06-27 MED ORDER — HYDROMORPHONE HCL PF 1 MG/ML IJ SOLN
2.0000 mg | Freq: Once | INTRAMUSCULAR | Status: AC
  Administered 2012-06-27: 2 mg via INTRAVENOUS
  Filled 2012-06-27: qty 2

## 2012-06-27 MED ORDER — ONDANSETRON HCL 4 MG/2ML IJ SOLN
4.0000 mg | Freq: Once | INTRAMUSCULAR | Status: AC
  Administered 2012-06-27: 4 mg via INTRAVENOUS
  Filled 2012-06-27: qty 2

## 2012-06-27 MED ORDER — HYDROMORPHONE HCL PF 1 MG/ML IJ SOLN
1.0000 mg | Freq: Once | INTRAMUSCULAR | Status: AC
  Administered 2012-06-27: 1 mg via INTRAVENOUS
  Filled 2012-06-27: qty 1

## 2012-06-27 MED ORDER — HYDROCODONE-ACETAMINOPHEN 5-325 MG PO TABS: 2.0000 | ORAL_TABLET | Freq: Four times a day (QID) | ORAL | Status: DC | PRN

## 2012-06-27 NOTE — ED Provider Notes (Addendum)
History     CSN: [id number]  Arrival date & time 06/27/12  1517   First MD Initiated Contact with Patient 06/27/12 1531      Chief Complaint  Patient presents with  . Emesis    (Consider location/radiation/quality/duration/timing/severity/associated sxs/prior treatment) HPI Complains of epigastric pain nonradiating onset 4 days ago the pain is constant moderate to severe not made better or worse by anything. Patient seen here 06/26/2012 for same complaint had CT scan which is nonacute, scribed Phenergan and has taken antacids, without relief though he felt better upon leaving the emergency department on 06/26/2012. Last ate egg and cheese biscuit8 a.m. today. Vomited 4 times today. Last bowel movement today, normal no fever no other associated symptoms. Past Medical History  Diagnosis Date  . Gastritis     Past Surgical History  Procedure Date  . Appendectomy   . Tonsillectomy   . Adenoidectomy     No family history on file.  History  Substance Use Topics  . Smoking status: Current Every Day Smoker  . Smokeless tobacco: Not on file  . Alcohol Use: Yes   Last alcohol 5 days ago   Review of Systems  Constitutional: Negative.   HENT: Negative.   Respiratory: Negative.   Cardiovascular: Negative.   Gastrointestinal: Positive for nausea, vomiting and abdominal pain.  Musculoskeletal: Negative.   Skin: Negative.   Neurological: Negative.   Hematological: Negative.   Psychiatric/Behavioral: Negative.   All other systems reviewed and are negative.    Allergies  Shellfish allergy  Home Medications   Current Outpatient Rx  Name Route Sig Dispense Refill  . ALPRAZOLAM 0.5 MG PO TABS Oral Take 0.5 mg by mouth at bedtime as needed. For anxiety.    Marland Kitchen CIPROFLOXACIN HCL 500 MG PO TABS Oral Take 1 tablet (500 mg total) by mouth 2 (two) times daily. 28 tablet 0  . LORAZEPAM 0.5 MG PO TABS Oral Take 0.5 mg by mouth every 8 (eight) hours.    . ONDANSETRON 8 MG PO TBDP Oral  Take 1 tablet (8 mg total) by mouth every 8 (eight) hours as needed for nausea. 20 tablet 0  . PANTOPRAZOLE SODIUM 20 MG PO TBEC Oral Take 2 tablets (40 mg total) by mouth daily. 30 tablet 0  . PROMETHAZINE HCL 25 MG PO TABS Oral Take 1 tablet (25 mg total) by mouth every 6 (six) hours as needed for nausea. 30 tablet 0  . TRAMADOL HCL 50 MG PO TABS Oral Take 50 mg by mouth every 6 (six) hours as needed. For pain.      BP 168/100  Pulse 75  Temp 98.6 F (37 C) (Oral)  Resp 18  Ht 5\' 7"  (1.702 m)  Wt 248 lb (112.492 kg)  BMI 38.84 kg/m2  SpO2 99%  Physical Exam  Nursing note and vitals reviewed. Constitutional: He appears well-developed and well-nourished.  HENT:  Head: Normocephalic and atraumatic.  Eyes: Conjunctivae normal are normal. Pupils are equal, round, and reactive to light.  Neck: Neck supple. No tracheal deviation present. No thyromegaly present.  Cardiovascular: Normal rate and regular rhythm.   No murmur heard. Pulmonary/Chest: Effort normal and breath sounds normal.  Abdominal: Soft. Bowel sounds are normal. He exhibits no distension and no mass. There is tenderness. There is no rebound and no guarding.       Obese epigastric tenderness  Genitourinary: Penis normal.  Musculoskeletal: Normal range of motion. He exhibits no edema and no tenderness.  Neurological: He is alert. Coordination  normal.  Skin: Skin is warm and dry. No rash noted.  Psychiatric: He has a normal mood and affect.    ED Course  Procedures (including critical care time)  Date: 06/27/2012  Rate: 70  Rhythm: normal sinus rhythm  QRS Axis: normal  Intervals: normal  ST/T Wave abnormalities: normal  Conduction Disutrbances: none  Narrative Interpretation: unremarkable  Unchanged from 02/27/2008 interpreted by me  Date: 06/27/2012  Rate: 55  Rhythm: atrial fibrillation  QRS Axis: left  Intervals: normal  ST/T Wave abnormalities: nonspecific T wave changes  Conduction Disutrbances:right  bundle branch block  Narrative Interpretation:   Old EKG Reviewed: EKG from 10/03/2003 showed atrial fibrillation 100 beats per minute otherwise unchanged from today's tracing is interpreted by me    Labs Reviewed  COMPREHENSIVE METABOLIC PANEL  CBC WITH DIFFERENTIAL  LIPASE, BLOOD  TROPONIN I   Ct Abdomen Pelvis W Contrast  06/26/2012  *RADIOLOGY REPORT*  Clinical Data: 36 year old male abdominal pain left groin pain nausea.  Status post appendectomy.  CT ABDOMEN AND PELVIS WITH CONTRAST  Technique:  Multidetector CT imaging of the abdomen and pelvis was performed following the standard protocol during bolus administration of intravenous contrast.  Contrast: OMNIPAQUE IOHEXOL 300 MG/ML  SOLN  Comparison: 07/23/2009.  Findings: Minor atelectasis at the lung bases.  No pericardial or pleural effusion. No acute osseous abnormality identified.  Chronic L2-L3 disc degeneration, now with trace vacuum disc phenomena.  No pelvic free fluid.  Negative bladder.  Negative distal colon. Negative proximal colon.  Oral contrast has reached the terminal ileum, but not yet the ileocecal valve.  No dilated small bowel. Negative stomach, duodenum, gallbladder, spleen, pancreas, adrenal glands, kidneys, portal venous system, and major arterial structures.  No abdominal free fluid.  Evidence of generalized decreased liver density, no discrete liver lesion.  No lymphadenopathy.  No pneumoperitoneum.  IMPRESSION: No acute or inflammatory findings in the abdomen or pelvis. Suggestion of hepatic steatosis.   Original Report Authenticated By: Harley Hallmark, M.D.      No diagnosis found.  X-rays reviewed by me 7 PM after treatment with intravenous fluids, narcotic pain medicine and antiemetics patient feels improved and ready to go home. He exhibits mentioned 0 without difficulty  MDM  Plan prescriptions Zofran, Norco, Prilosec OTC, followup Dr. Madilyn Fireman, as he has not yet schedule his appointment. Diagnosis  abdominal pain  Differential diagnosis includes gastritis, peptic ulcer disease, GERD, nonspecific. Strongly doubt cardiac etiology of pain but epigastric tenderness normal EKG and normal troponin Note patient did not fill prescription for protonix written for him at last visit as it was too expensive        Doug Sou, MD 06/27/12 1904  Doug Sou, MD 06/27/12 1610

## 2012-06-27 NOTE — ED Notes (Signed)
C/o vomiting, abd pain since 9/15-was seen here last night for same-cont'd pain and vomiting

## 2012-08-11 ENCOUNTER — Emergency Department (HOSPITAL_BASED_OUTPATIENT_CLINIC_OR_DEPARTMENT_OTHER)
Admission: EM | Admit: 2012-08-11 | Discharge: 2012-08-11 | Disposition: A | Discharge status: Home / Self Care | Payer: Self-pay | Attending: Emergency Medicine | Admitting: Emergency Medicine

## 2012-08-11 ENCOUNTER — Encounter (HOSPITAL_BASED_OUTPATIENT_CLINIC_OR_DEPARTMENT_OTHER): Payer: Self-pay | Admitting: *Deleted

## 2012-08-11 DIAGNOSIS — R1013 Epigastric pain: Secondary | ICD-10-CM

## 2012-08-11 DIAGNOSIS — F17200 Nicotine dependence, unspecified, uncomplicated: Secondary | ICD-10-CM | POA: Insufficient documentation

## 2012-08-11 DIAGNOSIS — F1010 Alcohol abuse, uncomplicated: Secondary | ICD-10-CM

## 2012-08-11 DIAGNOSIS — R112 Nausea with vomiting, unspecified: Secondary | ICD-10-CM

## 2012-08-11 DIAGNOSIS — K2970 Gastritis, unspecified, without bleeding: Secondary | ICD-10-CM

## 2012-08-11 DIAGNOSIS — Z91013 Allergy to seafood: Secondary | ICD-10-CM | POA: Insufficient documentation

## 2012-08-11 DIAGNOSIS — Z79899 Other long term (current) drug therapy: Secondary | ICD-10-CM | POA: Insufficient documentation

## 2012-08-11 LAB — COMPREHENSIVE METABOLIC PANEL
ALT: 15 U/L (ref 0–53)
AST: 12 U/L (ref 0–37)
Albumin: 4.5 g/dL (ref 3.5–5.2)
Alkaline Phosphatase: 72 U/L (ref 39–117)
Chloride: 102 mEq/L (ref 96–112)
Potassium: 3.9 mEq/L (ref 3.5–5.1)
Sodium: 142 mEq/L (ref 135–145)
Total Bilirubin: 0.3 mg/dL (ref 0.3–1.2)
Total Protein: 8.1 g/dL (ref 6.0–8.3)

## 2012-08-11 LAB — LIPASE, BLOOD: Lipase: 22 U/L (ref 11–59)

## 2012-08-11 MED ORDER — GI COCKTAIL ~~LOC~~
30.0000 mL | Freq: Once | ORAL | Status: AC
  Administered 2012-08-11: 30 mL via ORAL
  Filled 2012-08-11: qty 30

## 2012-08-11 MED ORDER — HYDROMORPHONE HCL PF 1 MG/ML IJ SOLN
1.0000 mg | Freq: Once | INTRAMUSCULAR | Status: AC
  Administered 2012-08-11: 1 mg via INTRAVENOUS
  Filled 2012-08-11: qty 1

## 2012-08-11 MED ORDER — PROMETHAZINE HCL 25 MG/ML IJ SOLN
25.0000 mg | Freq: Once | INTRAMUSCULAR | Status: AC
  Administered 2012-08-11: 25 mg via INTRAVENOUS
  Filled 2012-08-11: qty 1

## 2012-08-11 MED ORDER — ONDANSETRON HCL 4 MG/2ML IJ SOLN
4.0000 mg | Freq: Once | INTRAMUSCULAR | Status: AC
  Administered 2012-08-11: 4 mg via INTRAVENOUS
  Filled 2012-08-11: qty 2

## 2012-08-11 MED ORDER — LACTATED RINGERS IV BOLUS (SEPSIS)
1000.0000 mL | Freq: Once | INTRAVENOUS | Status: AC
  Administered 2012-08-11: 1000 mL via INTRAVENOUS

## 2012-08-11 MED ORDER — HYDROMORPHONE HCL PF 1 MG/ML IJ SOLN
0.5000 mg | Freq: Once | INTRAMUSCULAR | Status: AC
  Administered 2012-08-11: 0.5 mg via INTRAVENOUS
  Filled 2012-08-11: qty 1

## 2012-08-11 NOTE — ED Notes (Signed)
Blankets provided for comfort.

## 2012-08-11 NOTE — ED Provider Notes (Signed)
History  This chart was scribed for Jones Skene, MD by Marlin Canary and Shari Heritage. The patient was seen in room MH10/MH10. Patient's care was started at 1458.     CSN: [id number]  Arrival date & time 08/11/12  1342   First MD Initiated Contact with Patient 08/11/12 1458      Chief Complaint  Patient presents with  . Emesis     The history is provided by the patient. No language interpreter was used.    Steven Dudley is a 36 y.o. male with a 22-year history of gastritis who presents to the Emergency Department complaining of continuous emesis and nausea onset 10 hours ago.There is asssociated burning upper abdominal pain and mid back pain that he rates 7/10. Patient denies chest, pain SOB, cough, fever, chills, HA, rhinorrhea, blurry vision, double vision, diarrhea, dysuria, rash, bruising or bleeding. He has been taking Phenergan and Zofran at home with no relief. He also takes 2 Tramadol in the morning and 2 at night for chronic right knee pain.  Patient denies any current use of recreational drugs other marijuana occasionally. He does report a history of past alcohol and substance abuse (cocaine). He says that he has been sober from any recreational drugs besides marijuana for the past 10 years. Patient states that he did drink liquor last night before symptoms began early this morning. Patient has smoked 1 pack of cigarettes per day for past 10 years.   Past Medical History  Diagnosis Date  . Gastritis     Past Surgical History  Procedure Date  . Appendectomy   . Tonsillectomy   . Adenoidectomy     History reviewed. No pertinent family history.  History  Substance Use Topics  . Smoking status: Current Every Day Smoker  . Smokeless tobacco: Not on file  . Alcohol Use: Yes      Review of Systems At least 10pt or greater review of systems completed and are negative except where specified in the HPI.  Allergies  Shellfish allergy  Home Medications    Current Outpatient Rx  Name Route Sig Dispense Refill  . ALPRAZOLAM 0.5 MG PO TABS Oral Take 0.5 mg by mouth at bedtime as needed. For anxiety.    Marland Kitchen CIPROFLOXACIN HCL 500 MG PO TABS Oral Take 500 mg by mouth 2 (two) times daily.    Marland Kitchen HYDROCODONE-ACETAMINOPHEN 5-325 MG PO TABS Oral Take 2 tablets by mouth every 6 (six) hours as needed for pain. 8 tablet 0  . LORAZEPAM 0.5 MG PO TABS Oral Take 0.5 mg by mouth every 8 (eight) hours.    . ONDANSETRON 8 MG PO TBDP Oral Take 1 tablet (8 mg total) by mouth every 8 (eight) hours as needed for nausea. 20 tablet 0  . ONDANSETRON HCL 8 MG PO TABS Oral Take 1 tablet (8 mg total) by mouth every 8 (eight) hours as needed for nausea. 20 tablet 0  . PANTOPRAZOLE SODIUM 20 MG PO TBEC Oral Take 2 tablets (40 mg total) by mouth daily. 30 tablet 0  . PROMETHAZINE HCL 25 MG PO TABS Oral Take 1 tablet (25 mg total) by mouth every 6 (six) hours as needed for nausea. 30 tablet 0  . TRAMADOL HCL 50 MG PO TABS Oral Take 50 mg by mouth every 6 (six) hours as needed. For pain.      Pulse 68  Temp 98.9 F (37.2 C)  Resp 20  Ht 5\' 8"  (1.727 m)  Wt 251 lb (113.853 kg)  BMI 38.16 kg/m2  SpO2 98%  Physical Exam  Nursing notes reviewed.  Electronic medical record reviewed. VITAL SIGNS:   Filed Vitals:   08/11/12 1403  Pulse: 68  Temp: 98.9 F (37.2 C)  Resp: 20  Height: 5\' 8"  (1.727 m)  Weight: 251 lb (113.853 kg)  SpO2: 98%   CONSTITUTIONAL: Awake, oriented, appears non-toxic HENT: Atraumatic, normocephalic, oral mucosa pink and moist, airway patent. Nares patent without drainage. External ears normal. EYES: Conjunctiva clear, EOMI, PERRLA NECK: Trachea midline, non-tender, supple CARDIOVASCULAR: Normal heart rate, Normal rhythm, No murmurs, rubs, gallops PULMONARY/CHEST: Clear to auscultation, no rhonchi, wheezes, or rales. Symmetrical breath sounds. Non-tender. ABDOMINAL: Non-distended, obese, soft, non-tender - no rebound or guarding.  BS  normal. NEUROLOGIC: Non-focal, moving all four extremities, no gross sensory or motor deficits. EXTREMITIES: No clubbing, cyanosis, or edema SKIN: Warm, Dry, No erythema, No rash    ED Course  Procedures (including critical care time) DIAGNOSTIC STUDIES: Oxygen Saturation is 98% on room air, normal by my interpretation.    COORDINATION OF CARE: 3:10pm- Patient informed of current plan for treatment and evaluation and agrees with plan at this time.  Results for orders placed during the hospital encounter of 08/11/12  COMPREHENSIVE METABOLIC PANEL      Component Value Range   Sodium 142  135 - 145 mEq/L   Potassium 3.9  3.5 - 5.1 mEq/L   Chloride 102  96 - 112 mEq/L   CO2 29  19 - 32 mEq/L   Glucose, Bld 162 (*) 70 - 99 mg/dL   BUN 13  6 - 23 mg/dL   Creatinine, Ser 4.09  0.50 - 1.35 mg/dL   Calcium 9.9  8.4 - 81.1 mg/dL   Total Protein 8.1  6.0 - 8.3 g/dL   Albumin 4.5  3.5 - 5.2 g/dL   AST 12  0 - 37 U/L   ALT 15  0 - 53 U/L   Alkaline Phosphatase 72  39 - 117 U/L   Total Bilirubin 0.3  0.3 - 1.2 mg/dL   GFR calc non Af Amer >90  >90 mL/min   GFR calc Af Amer >90  >90 mL/min  LIPASE, BLOOD      Component Value Range   Lipase 22  11 - 59 U/L     No results found.   1. Gastritis   2. Epigastric pain   3. Alcohol abuse   4. Nausea and vomiting      Medications  gi cocktail (Maalox,Lidocaine,Donnatal) (30 mL Oral Given 08/11/12 1531)  promethazine (PHENERGAN) injection 25 mg (25 mg Intravenous Given 08/11/12 1527)  lactated ringers bolus 1,000 mL (0 mL Intravenous Stopped 08/11/12 1616)  HYDROmorphone (DILAUDID) injection 1 mg (1 mg Intravenous Given 08/11/12 1526)  HYDROmorphone (DILAUDID) injection 0.5 mg (0.5 mg Intravenous Given 08/11/12 1712)  ondansetron (ZOFRAN) injection 4 mg (4 mg Intravenous Given 08/11/12 1712)     MDM  Steven Dudley is a 36 y.o. male w/ h/o gastritis and allcohol abuse presents with gastritis flare after drinking a fifth of liquor  last night.  Pt abdomen is benign.  I think his cause of N/V and pain is likely gastritis secondary to alcohol intake, also considered: Functional or nonulcer dyspepsia PUD, GERD, pancreatitis or pancreatic cancer, overeating indigestion (high-fat foods, coffee), drugs (aspirin, antibiotics (eg, macrolides, metronidazole), corticosteroids, digoxin, narcotics, theophylline), gastroparesis, lactose intolerance, malabsorption, gastric cancer, parasitic infection, (Giardia, Strongyloides, Ascaris) cholelithiasis, choledocholithiasis, or cholangitis, ACS, pericarditis, pneumonia, abdominal hernia, pregnancy, intestinal ischemia, esophageal  rupture, gastric volvulus, hepatitis.  Based on his overall benign exam, non-toxic appearance, unremarkable vital signs, lack of associated symptoms, risk factors, I think ACS is highly unlikely, likewise PNA, ruptured viscus, esophageal rupture, volvulus.  Did check LFT and LIP to r/o hepatitis and pancreatitis as he is high risk for disorders of the liver and pancreas.  No biliary disease implied either as pt retains his GB/  Not taking suspect medications.  No h/o diabetes, or international travel.  Advised pt of likely diagnosis, urged alcohol abstinence, take omeprazole BID and use OTC acid reducers liberally to reduce symptoms.    I explained the diagnosis and have given explicit precautions to return to the ER including chest pain, dyspnea, any other new or worsening symptoms. The patient understands and accepts the medical plan as it's been dictated and I have answered their questions. Discharge instructions concerning home care and prescriptions have been given.  The patient is STABLE and is discharged to home in good condition.   I personally performed the services described in this documentation, which was scribed in my presence. The recorded information has been reviewed and considered. Jones Skene, M.D.     Jones Skene, MD 08/13/12 1227

## 2012-08-11 NOTE — ED Notes (Addendum)
Pt reports unremitting n/v ( since arrival to ED). Reports 13+ years of gastritis and prescribed narcotic use for various pain concerns. Reports that he also is very stressed and drinking ETOH. Pt offers that he knows drinking makes his gastritis worse, but that he has" just been very stressed."   Pt requests fluids, zofran, and "IV pain medicine."

## 2012-08-11 NOTE — ED Notes (Addendum)
Pt has home zofran which he last took at 10am with no relief in sx

## 2012-08-11 NOTE — ED Notes (Signed)
Pt presents to ED today with N/V since 5am.  Pt has hx of gastritis

## 2012-08-13 ENCOUNTER — Emergency Department (HOSPITAL_BASED_OUTPATIENT_CLINIC_OR_DEPARTMENT_OTHER)
Admission: EM | Admit: 2012-08-13 | Discharge: 2012-08-13 | Disposition: A | Discharge status: Home / Self Care | Payer: Self-pay | Attending: Emergency Medicine | Admitting: Emergency Medicine

## 2012-08-13 ENCOUNTER — Encounter (HOSPITAL_BASED_OUTPATIENT_CLINIC_OR_DEPARTMENT_OTHER): Payer: Self-pay | Admitting: *Deleted

## 2012-08-13 DIAGNOSIS — F17200 Nicotine dependence, unspecified, uncomplicated: Secondary | ICD-10-CM | POA: Insufficient documentation

## 2012-08-13 DIAGNOSIS — R1013 Epigastric pain: Secondary | ICD-10-CM | POA: Insufficient documentation

## 2012-08-13 DIAGNOSIS — K2970 Gastritis, unspecified, without bleeding: Secondary | ICD-10-CM | POA: Insufficient documentation

## 2012-08-13 MED ORDER — ONDANSETRON HCL 4 MG/2ML IJ SOLN
4.0000 mg | Freq: Once | INTRAMUSCULAR | Status: AC
  Administered 2012-08-13: 4 mg via INTRAVENOUS
  Filled 2012-08-13: qty 2

## 2012-08-13 MED ORDER — HYDROMORPHONE HCL PF 1 MG/ML IJ SOLN
1.0000 mg | Freq: Once | INTRAMUSCULAR | Status: AC
  Administered 2012-08-13: 1 mg via INTRAVENOUS
  Filled 2012-08-13: qty 1

## 2012-08-13 MED ORDER — SODIUM CHLORIDE 0.9 % IV SOLN
Freq: Once | INTRAVENOUS | Status: AC
  Administered 2012-08-13: 12:00:00 via INTRAVENOUS

## 2012-08-13 NOTE — ED Provider Notes (Signed)
History     CSN: [id number]  Arrival date & time 08/13/12  1008   First MD Initiated Contact with Patient 08/13/12 1112      Chief Complaint  Patient presents with  . Nausea    (Consider location/radiation/quality/duration/timing/severity/associated sxs/prior treatment) HPI Pt with history of gastritis felt to be due to EtOH reports he was drinking over the weekend. Has had severe epigastric pain and multiple episodes of vomiting since then, not improved with home meds. Seen in the ED 2 days ago for same and had neg labs done then. No fever, no blood in vomit.   Past Medical History  Diagnosis Date  . Gastritis     Past Surgical History  Procedure Date  . Appendectomy   . Tonsillectomy   . Adenoidectomy     History reviewed. No pertinent family history.  History  Substance Use Topics  . Smoking status: Current Every Day Smoker  . Smokeless tobacco: Not on file  . Alcohol Use: Yes      Review of Systems All other systems reviewed and are negative except as noted in HPI.   Allergies  Shellfish allergy  Home Medications   Current Outpatient Rx  Name  Route  Sig  Dispense  Refill  . ALPRAZOLAM 0.5 MG PO TABS   Oral   Take 0.5 mg by mouth at bedtime as needed. For anxiety.         Marland Kitchen CIPROFLOXACIN HCL 500 MG PO TABS   Oral   Take 500 mg by mouth 2 (two) times daily.         Marland Kitchen HYDROCODONE-ACETAMINOPHEN 5-325 MG PO TABS   Oral   Take 2 tablets by mouth every 6 (six) hours as needed for pain.   8 tablet   0   . LORAZEPAM 0.5 MG PO TABS   Oral   Take 0.5 mg by mouth every 8 (eight) hours.         . ONDANSETRON 8 MG PO TBDP   Oral   Take 1 tablet (8 mg total) by mouth every 8 (eight) hours as needed for nausea.   20 tablet   0   . ONDANSETRON HCL 8 MG PO TABS   Oral   Take 1 tablet (8 mg total) by mouth every 8 (eight) hours as needed for nausea.   20 tablet   0   . PANTOPRAZOLE SODIUM 20 MG PO TBEC   Oral   Take 2 tablets (40 mg total) by  mouth daily.   30 tablet   0   . PROMETHAZINE HCL 25 MG PO TABS   Oral   Take 1 tablet (25 mg total) by mouth every 6 (six) hours as needed for nausea.   30 tablet   0   . TRAMADOL HCL 50 MG PO TABS   Oral   Take 50 mg by mouth every 6 (six) hours as needed. For pain.           BP 172/94  Pulse 88  Temp 97.7 F (36.5 C) (Oral)  Resp 22  SpO2 98%  Physical Exam  Nursing note and vitals reviewed. Constitutional: He is oriented to person, place, and time. He appears well-developed and well-nourished.  HENT:  Head: Normocephalic and atraumatic.  Eyes: EOM are normal. Pupils are equal, round, and reactive to light.  Neck: Normal range of motion. Neck supple.  Cardiovascular: Normal rate, normal heart sounds and intact distal pulses.   Pulmonary/Chest: Effort normal and breath sounds  normal.  Abdominal: Soft. Bowel sounds are normal. He exhibits no distension. There is tenderness (epigastric). There is no rebound and no guarding.  Musculoskeletal: Normal range of motion. He exhibits no edema and no tenderness.  Neurological: He is alert and oriented to person, place, and time. He has normal strength. No cranial nerve deficit or sensory deficit.  Skin: Skin is warm and dry. No rash noted.  Psychiatric: He has a normal mood and affect.    ED Course  Procedures (including critical care time)  Labs Reviewed - No data to display No results found.   No diagnosis found.    MDM  No indication for repeat labs. Given IVF and pain/nausea meds. States he took two ativan prior to arrival without relief.   1:34 PM No further vomiting in the ED. Advised PCP and/or GI followup.       Charles B. Bernette Mayers, MD 08/13/12 1334

## 2012-10-25 ENCOUNTER — Encounter (HOSPITAL_BASED_OUTPATIENT_CLINIC_OR_DEPARTMENT_OTHER): Payer: Self-pay | Admitting: *Deleted

## 2012-10-25 ENCOUNTER — Emergency Department (HOSPITAL_BASED_OUTPATIENT_CLINIC_OR_DEPARTMENT_OTHER)
Admission: EM | Admit: 2012-10-25 | Discharge: 2012-10-25 | Disposition: A | Discharge status: Home / Self Care | Payer: Self-pay | Attending: Emergency Medicine | Admitting: Emergency Medicine

## 2012-10-25 DIAGNOSIS — K2990 Gastroduodenitis, unspecified, without bleeding: Secondary | ICD-10-CM | POA: Insufficient documentation

## 2012-10-25 DIAGNOSIS — F411 Generalized anxiety disorder: Secondary | ICD-10-CM | POA: Insufficient documentation

## 2012-10-25 DIAGNOSIS — F17200 Nicotine dependence, unspecified, uncomplicated: Secondary | ICD-10-CM | POA: Insufficient documentation

## 2012-10-25 DIAGNOSIS — Z79899 Other long term (current) drug therapy: Secondary | ICD-10-CM | POA: Insufficient documentation

## 2012-10-25 DIAGNOSIS — R109 Unspecified abdominal pain: Secondary | ICD-10-CM | POA: Insufficient documentation

## 2012-10-25 DIAGNOSIS — K219 Gastro-esophageal reflux disease without esophagitis: Secondary | ICD-10-CM | POA: Insufficient documentation

## 2012-10-25 DIAGNOSIS — K2970 Gastritis, unspecified, without bleeding: Secondary | ICD-10-CM | POA: Insufficient documentation

## 2012-10-25 DIAGNOSIS — J45909 Unspecified asthma, uncomplicated: Secondary | ICD-10-CM | POA: Insufficient documentation

## 2012-10-25 HISTORY — DX: Anxiety disorder, unspecified: F41.9

## 2012-10-25 HISTORY — DX: Unspecified asthma, uncomplicated: J45.909

## 2012-10-25 HISTORY — DX: Gastro-esophageal reflux disease without esophagitis: K21.9

## 2012-10-25 LAB — URINALYSIS, ROUTINE W REFLEX MICROSCOPIC
Glucose, UA: NEGATIVE mg/dL
Hgb urine dipstick: NEGATIVE
Ketones, ur: 15 mg/dL — AB
Nitrite: NEGATIVE
Protein, ur: 30 mg/dL — AB
Specific Gravity, Urine: 1.03 (ref 1.005–1.030)
Urobilinogen, UA: 1 mg/dL (ref 0.0–1.0)
pH: 8 (ref 5.0–8.0)

## 2012-10-25 LAB — URINE MICROSCOPIC-ADD ON

## 2012-10-25 LAB — CBC WITH DIFFERENTIAL/PLATELET
Eosinophils Absolute: 0 10*3/uL (ref 0.0–0.7)
HCT: 49.7 % (ref 39.0–52.0)
Hemoglobin: 17.3 g/dL — ABNORMAL HIGH (ref 13.0–17.0)
Lymphs Abs: 1.6 10*3/uL (ref 0.7–4.0)
MCH: 29.9 pg (ref 26.0–34.0)
MCHC: 34.8 g/dL (ref 30.0–36.0)
Monocytes Absolute: 0.9 10*3/uL (ref 0.1–1.0)
Monocytes Relative: 6 % (ref 3–12)
Neutro Abs: 11.3 10*3/uL — ABNORMAL HIGH (ref 1.7–7.7)
Neutrophils Relative %: 82 % — ABNORMAL HIGH (ref 43–77)
RBC: 5.78 MIL/uL (ref 4.22–5.81)

## 2012-10-25 LAB — BASIC METABOLIC PANEL
BUN: 10 mg/dL (ref 6–23)
Chloride: 99 mEq/L (ref 96–112)
Creatinine, Ser: 0.9 mg/dL (ref 0.50–1.35)
Glucose, Bld: 135 mg/dL — ABNORMAL HIGH (ref 70–99)
Potassium: 3.6 mEq/L (ref 3.5–5.1)

## 2012-10-25 LAB — LIPASE, BLOOD: Lipase: 25 U/L (ref 11–59)

## 2012-10-25 MED ORDER — ONDANSETRON HCL 4 MG/2ML IJ SOLN
4.0000 mg | Freq: Once | INTRAMUSCULAR | Status: AC
  Administered 2012-10-25: 4 mg via INTRAVENOUS
  Filled 2012-10-25: qty 2

## 2012-10-25 MED ORDER — FAMOTIDINE IN NACL 20-0.9 MG/50ML-% IV SOLN
20.0000 mg | Freq: Once | INTRAVENOUS | Status: AC
  Administered 2012-10-25: 20 mg via INTRAVENOUS
  Filled 2012-10-25: qty 50

## 2012-10-25 MED ORDER — PROMETHAZINE HCL 25 MG/ML IJ SOLN
25.0000 mg | Freq: Once | INTRAMUSCULAR | Status: AC
  Administered 2012-10-25: 25 mg via INTRAVENOUS
  Filled 2012-10-25: qty 1

## 2012-10-25 MED ORDER — SODIUM CHLORIDE 0.9 % IV SOLN
Freq: Once | INTRAVENOUS | Status: AC
  Administered 2012-10-25: 15:00:00 via INTRAVENOUS

## 2012-10-25 MED ORDER — HYDROMORPHONE HCL PF 1 MG/ML IJ SOLN
1.0000 mg | Freq: Once | INTRAMUSCULAR | Status: AC
  Administered 2012-10-25: 1 mg via INTRAVENOUS
  Filled 2012-10-25: qty 1

## 2012-10-25 MED ORDER — HYDROCODONE-ACETAMINOPHEN 5-325 MG PO TABS: 2.0000 | ORAL_TABLET | Freq: Four times a day (QID) | ORAL | Status: DC | PRN

## 2012-10-25 MED ORDER — OMEPRAZOLE 20 MG PO CPDR: 20.0000 mg | DELAYED_RELEASE_CAPSULE | Freq: Every day | ORAL | Status: DC

## 2012-10-25 MED ORDER — PROMETHAZINE HCL 25 MG PO TABS: 25.0000 mg | ORAL_TABLET | Freq: Four times a day (QID) | ORAL | Status: DC | PRN

## 2012-10-25 MED ORDER — ALBUTEROL SULFATE HFA 108 (90 BASE) MCG/ACT IN AERS
2.0000 | INHALATION_SPRAY | Freq: Once | RESPIRATORY_TRACT | Status: AC
  Administered 2012-10-25: 2 via RESPIRATORY_TRACT
  Filled 2012-10-25: qty 6.7

## 2012-10-25 NOTE — ED Notes (Signed)
Patient states he has a 3 day history of nausea and vomiting.  States he has chronic problems with same.

## 2012-10-25 NOTE — ED Provider Notes (Signed)
History     CSN: [id number]  Arrival date & time 10/25/12  1047   First MD Initiated Contact with Patient 10/25/12 1328      Chief Complaint  Patient presents with  . Nausea  . Emesis    (Consider location/radiation/quality/duration/timing/severity/associated sxs/prior treatment) Patient is a 37 y.o. male presenting with abdominal pain. The history is provided by the patient. No language interpreter was used.  Abdominal Pain The primary symptoms of the illness include abdominal pain and vomiting. The current episode started more than 2 days ago. The onset of the illness was gradual. The problem has been gradually improving.  Associated with: alcohol abuse. The patient states that she believes she is currently not pregnant. Risk factors: alcohol abuse. Symptoms associated with the illness do not include chills. Significant associated medical issues include GERD. Associated medical issues comments: alcohol gastritis.    Past Medical History  Diagnosis Date  . Gastritis   . Acid reflux   . Asthma   . Anxiety     Past Surgical History  Procedure Date  . Appendectomy   . Tonsillectomy   . Adenoidectomy     No family history on file.  History  Substance Use Topics  . Smoking status: Current Every Day Smoker  . Smokeless tobacco: Not on file  . Alcohol Use: Yes      Review of Systems  Constitutional: Negative for chills.  Gastrointestinal: Positive for vomiting and abdominal pain.  All other systems reviewed and are negative.    Allergies  Shellfish allergy  Home Medications   Current Outpatient Rx  Name  Route  Sig  Dispense  Refill  . OMEPRAZOLE 20 MG PO CPDR   Oral   Take 20 mg by mouth daily.         Marland Kitchen ALPRAZOLAM 0.5 MG PO TABS   Oral   Take 0.5 mg by mouth at bedtime as needed. For anxiety.         Marland Kitchen CIPROFLOXACIN HCL 500 MG PO TABS   Oral   Take 500 mg by mouth 2 (two) times daily.         Marland Kitchen HYDROCODONE-ACETAMINOPHEN 5-325 MG PO TABS  Oral   Take 2 tablets by mouth every 6 (six) hours as needed for pain.   8 tablet   0   . LORAZEPAM 0.5 MG PO TABS   Oral   Take 0.5 mg by mouth every 8 (eight) hours.         . ONDANSETRON 8 MG PO TBDP   Oral   Take 1 tablet (8 mg total) by mouth every 8 (eight) hours as needed for nausea.   20 tablet   0   . ONDANSETRON HCL 8 MG PO TABS   Oral   Take 1 tablet (8 mg total) by mouth every 8 (eight) hours as needed for nausea.   20 tablet   0   . PANTOPRAZOLE SODIUM 20 MG PO TBEC   Oral   Take 2 tablets (40 mg total) by mouth daily.   30 tablet   0   . PROMETHAZINE HCL 25 MG PO TABS   Oral   Take 1 tablet (25 mg total) by mouth every 6 (six) hours as needed for nausea.   30 tablet   0   . TRAMADOL HCL 50 MG PO TABS   Oral   Take 50 mg by mouth every 6 (six) hours as needed. For pain.  BP 172/95  Pulse 105  Temp 98.2 F (36.8 C) (Oral)  Resp 20  Ht 5\' 8"  (1.727 m)  Wt 245 lb (111.131 kg)  BMI 37.25 kg/m2  SpO2 97%  Physical Exam  Vitals reviewed. Constitutional: He appears well-developed and well-nourished.  HENT:  Head: Normocephalic and atraumatic.  Right Ear: External ear normal.  Left Ear: External ear normal.  Nose: Nose normal.  Mouth/Throat: Oropharynx is clear and moist.  Eyes: Conjunctivae normal are normal. Pupils are equal, round, and reactive to light.  Neck: Normal range of motion. Neck supple.  Cardiovascular: Normal rate.   Pulmonary/Chest: Effort normal and breath sounds normal.  Abdominal: Soft. Bowel sounds are normal. There is tenderness.       Tender diffusely  Musculoskeletal: Normal range of motion.  Neurological: He is alert.  Skin: Skin is warm.    ED Course  Procedures (including critical care time)  Labs Reviewed  URINALYSIS, ROUTINE W REFLEX MICROSCOPIC - Abnormal; Notable for the following:    Color, Urine AMBER (*)  BIOCHEMICALS MAY BE AFFECTED BY COLOR   APPearance CLOUDY (*)     Bilirubin Urine SMALL  (*)     Ketones, ur 15 (*)     Protein, ur 30 (*)     Leukocytes, UA SMALL (*)     All other components within normal limits  CBC WITH DIFFERENTIAL - Abnormal; Notable for the following:    WBC 13.8 (*)     Hemoglobin 17.3 (*)     Neutrophils Relative 82 (*)     Neutro Abs 11.3 (*)     Lymphocytes Relative 11 (*)     All other components within normal limits  BASIC METABOLIC PANEL - Abnormal; Notable for the following:    Glucose, Bld 135 (*)     All other components within normal limits  URINE MICROSCOPIC-ADD ON - Abnormal; Notable for the following:    Crystals CA OXALATE CRYSTALS (*)     All other components within normal limits   No results found.   1. Gastritis       MDM  Iv ns x 2 liters, pepcid iv,  Dilaudid and phenergan Iv.   Pt reports he feels much better after fluids and pepcid.  Pt reports he has not been drinking in the past 4 days.   Pt will make contacts with AA.  Pt given rx for phenergan and hydrocodone       Lonia Skinner [place], [place] 10/26/12 1906  Lonia Skinner [place], [place] 10/26/12 1907  Lonia Skinner [place], [place] 10/26/12 1908  Lonia Skinner [place], [place] 10/26/12 Windell Moment

## 2012-10-25 NOTE — ED Notes (Signed)
Pt changed into gown.

## 2012-10-25 NOTE — ED Notes (Signed)
Pt. Was told the Dr. Stann Mainland be in to see him as soon as he or she can.

## 2012-10-28 NOTE — ED Provider Notes (Signed)
Medical screening examination/treatment/procedure(s) were performed by non-physician practitioner and as supervising physician I was immediately available for consultation/collaboration.   Josephina Melcher W. Gilverto Dileonardo, MD 10/28/12 1410 

## 2013-08-11 ENCOUNTER — Emergency Department (HOSPITAL_BASED_OUTPATIENT_CLINIC_OR_DEPARTMENT_OTHER)
Admission: EM | Admit: 2013-08-11 | Discharge: 2013-08-11 | Disposition: A | Discharge status: Home / Self Care | Payer: Self-pay | Attending: Emergency Medicine | Admitting: Emergency Medicine

## 2013-08-11 ENCOUNTER — Encounter (HOSPITAL_BASED_OUTPATIENT_CLINIC_OR_DEPARTMENT_OTHER): Payer: Self-pay | Admitting: Emergency Medicine

## 2013-08-11 DIAGNOSIS — Z9089 Acquired absence of other organs: Secondary | ICD-10-CM | POA: Insufficient documentation

## 2013-08-11 DIAGNOSIS — F411 Generalized anxiety disorder: Secondary | ICD-10-CM | POA: Insufficient documentation

## 2013-08-11 DIAGNOSIS — J45909 Unspecified asthma, uncomplicated: Secondary | ICD-10-CM | POA: Insufficient documentation

## 2013-08-11 DIAGNOSIS — R112 Nausea with vomiting, unspecified: Secondary | ICD-10-CM | POA: Insufficient documentation

## 2013-08-11 DIAGNOSIS — F17200 Nicotine dependence, unspecified, uncomplicated: Secondary | ICD-10-CM | POA: Insufficient documentation

## 2013-08-11 DIAGNOSIS — Z79899 Other long term (current) drug therapy: Secondary | ICD-10-CM | POA: Insufficient documentation

## 2013-08-11 DIAGNOSIS — K219 Gastro-esophageal reflux disease without esophagitis: Secondary | ICD-10-CM | POA: Insufficient documentation

## 2013-08-11 DIAGNOSIS — R1013 Epigastric pain: Secondary | ICD-10-CM | POA: Insufficient documentation

## 2013-08-11 LAB — URINALYSIS, ROUTINE W REFLEX MICROSCOPIC
Hgb urine dipstick: NEGATIVE
Ketones, ur: NEGATIVE mg/dL
Nitrite: NEGATIVE
Specific Gravity, Urine: 1.025 (ref 1.005–1.030)
Urobilinogen, UA: 0.2 mg/dL (ref 0.0–1.0)

## 2013-08-11 LAB — COMPREHENSIVE METABOLIC PANEL
ALT: 18 U/L (ref 0–53)
Alkaline Phosphatase: 79 U/L (ref 39–117)
Calcium: 10.2 mg/dL (ref 8.4–10.5)
Chloride: 100 mEq/L (ref 96–112)
GFR calc Af Amer: >90 mL/min (ref >90)
Glucose, Bld: 133 mg/dL — ABNORMAL HIGH (ref 70–99)
Sodium: 142 mEq/L (ref 135–145)
Total Bilirubin: 0.3 mg/dL (ref 0.3–1.2)
Total Protein: 8.2 g/dL (ref 6.0–8.3)

## 2013-08-11 LAB — CBC WITH DIFFERENTIAL/PLATELET
Basophils Absolute: 0 10*3/uL (ref 0.0–0.1)
Basophils Relative: 0 % (ref 0–1)
Eosinophils Absolute: 0 10*3/uL (ref 0.0–0.7)
Eosinophils Relative: 0 % (ref 0–5)
Hemoglobin: 17.1 g/dL — ABNORMAL HIGH (ref 13.0–17.0)
Lymphs Abs: 2.8 10*3/uL (ref 0.7–4.0)
MCH: 30.3 pg (ref 26.0–34.0)
MCHC: 34.7 g/dL (ref 30.0–36.0)
MCV: 87.3 fL (ref 78.0–100.0)
Platelets: 281 10*3/uL (ref 150–400)
RDW: 12.6 % (ref 11.5–15.5)

## 2013-08-11 MED ORDER — ONDANSETRON 8 MG PO TBDP
ORAL_TABLET | ORAL | Status: AC
  Administered 2013-08-11: 8 mg via ORAL
  Filled 2013-08-11: qty 1

## 2013-08-11 MED ORDER — PROMETHAZINE HCL 25 MG/ML IJ SOLN
25.0000 mg | Freq: Once | INTRAMUSCULAR | Status: AC
  Administered 2013-08-11: 25 mg via INTRAVENOUS
  Filled 2013-08-11: qty 1

## 2013-08-11 MED ORDER — ONDANSETRON 8 MG PO TBDP
8.0000 mg | ORAL_TABLET | Freq: Once | ORAL | Status: AC
  Administered 2013-08-11: 8 mg via ORAL

## 2013-08-11 MED ORDER — ONDANSETRON HCL 4 MG/2ML IJ SOLN
4.0000 mg | Freq: Once | INTRAMUSCULAR | Status: AC
  Administered 2013-08-11: 4 mg via INTRAVENOUS
  Filled 2013-08-11: qty 2

## 2013-08-11 MED ORDER — PROMETHAZINE HCL 25 MG PO TABS: 25.0000 mg | ORAL_TABLET | Freq: Four times a day (QID) | ORAL | Status: DC | PRN

## 2013-08-11 MED ORDER — PANTOPRAZOLE SODIUM 40 MG IV SOLR
40.0000 mg | INTRAVENOUS | Status: AC
  Administered 2013-08-11: 40 mg via INTRAVENOUS
  Filled 2013-08-11: qty 40

## 2013-08-11 MED ORDER — MORPHINE SULFATE 4 MG/ML IJ SOLN
4.0000 mg | Freq: Once | INTRAMUSCULAR | Status: AC
  Administered 2013-08-11: 4 mg via INTRAVENOUS
  Filled 2013-08-11: qty 1

## 2013-08-11 MED ORDER — SODIUM CHLORIDE 0.9 % IV BOLUS (SEPSIS)
1000.0000 mL | Freq: Once | INTRAVENOUS | Status: AC
  Administered 2013-08-11: 1000 mL via INTRAVENOUS

## 2013-08-11 NOTE — ED Notes (Signed)
Patient states he has been vomiting since 4 am, states that he has gastritis and believes this to be a flare up. Patient is diaphoretic at this time.

## 2013-08-11 NOTE — ED Provider Notes (Signed)
CSN: [id number]     Arrival date & time 08/11/13  1123 History   First MD Initiated Contact with Patient 08/11/13 1159     Chief Complaint  Patient presents with  . Emesis   (Consider location/radiation/quality/duration/timing/severity/associated sxs/prior Treatment) HPI Comments: Patient is a 37 year old male with a past medical history of asthma and anxiety who presents with nausea and vomiting since early this morning. Patient reports symptoms started gradually and progressively worsened since the onset. Patient reports this feels like a gastritis flare up. Patient reports associated epigastric abdominal pain at this time. This feels like an episode he has experienced previously. No aggravating/alleviating factors. Patient reports a history of appendectomy.   Patient is a 37 y.o. male presenting with vomiting.  Emesis Associated symptoms: abdominal pain     Past Medical History  Diagnosis Date  . Gastritis   . Acid reflux   . Asthma   . Anxiety    Past Surgical History  Procedure Laterality Date  . Appendectomy    . Tonsillectomy    . Adenoidectomy     No family history on file. History  Substance Use Topics  . Smoking status: Current Every Day Smoker  . Smokeless tobacco: Not on file  . Alcohol Use: Yes    Review of Systems  Gastrointestinal: Positive for nausea, vomiting and abdominal pain.  All other systems reviewed and are negative.    Allergies  Shellfish allergy  Home Medications   Current Outpatient Rx  Name  Route  Sig  Dispense  Refill  . ALPRAZolam (XANAX) 0.5 MG tablet   Oral   Take 0.5 mg by mouth at bedtime as needed. For anxiety.         . ciprofloxacin (CIPRO) 500 MG tablet   Oral   Take 500 mg by mouth 2 (two) times daily.         Marland Kitchen HYDROcodone-acetaminophen (NORCO/VICODIN) 5-325 MG per tablet   Oral   Take 2 tablets by mouth every 6 (six) hours as needed for pain.   16 tablet   0   . LORazepam (ATIVAN) 0.5 MG tablet   Oral  Take 0.5 mg by mouth every 8 (eight) hours.         Marland Kitchen omeprazole (PRILOSEC) 20 MG capsule   Oral   Take 1 capsule (20 mg total) by mouth daily.   20 capsule   0   . ondansetron (ZOFRAN ODT) 8 MG disintegrating tablet   Oral   Take 1 tablet (8 mg total) by mouth every 8 (eight) hours as needed for nausea.   20 tablet   0   . ondansetron (ZOFRAN) 8 MG tablet   Oral   Take 1 tablet (8 mg total) by mouth every 8 (eight) hours as needed for nausea.   20 tablet   0   . pantoprazole (PROTONIX) 20 MG tablet   Oral   Take 2 tablets (40 mg total) by mouth daily.   30 tablet   0   . promethazine (PHENERGAN) 25 MG tablet   Oral   Take 1 tablet (25 mg total) by mouth every 6 (six) hours as needed for nausea.   30 tablet   0   . promethazine (PHENERGAN) 25 MG tablet   Oral   Take 1 tablet (25 mg total) by mouth every 6 (six) hours as needed for nausea.   20 tablet   0   . traMADol (ULTRAM) 50 MG tablet   Oral  Take 50 mg by mouth every 6 (six) hours as needed. For pain.          BP 169/110  Pulse 70  Temp(Src) 97.4 F (36.3 C) (Oral)  Resp 18  Ht 5\' 7"  (1.702 m)  Wt 240 lb (108.863 kg)  BMI 37.58 kg/m2  SpO2 100% Physical Exam  Nursing note and vitals reviewed. Constitutional: He is oriented to person, place, and time. He appears well-developed and well-nourished. No distress.  HENT:  Head: Normocephalic and atraumatic.  Eyes: Conjunctivae and EOM are normal. Pupils are equal, round, and reactive to light. No scleral icterus.  Neck: Normal range of motion.  Cardiovascular: Normal rate and regular rhythm.  Exam reveals no gallop and no friction rub.   No murmur heard. Pulmonary/Chest: Effort normal and breath sounds normal. He has no wheezes. He has no rales. He exhibits no tenderness.  Abdominal: Soft. He exhibits no distension. There is tenderness. There is no rebound and no guarding.  Epigastric tenderness to palpation. No peritoneal signs or other focal  tenderness to palpation.   Musculoskeletal: Normal range of motion.  Neurological: He is alert and oriented to person, place, and time. Coordination normal.  Speech is goal-oriented. Moves limbs without ataxia.   Skin: Skin is warm and dry.  Psychiatric: He has a normal mood and affect. His behavior is normal.    ED Course  Procedures (including critical care time) Labs Review Labs Reviewed  CBC WITH DIFFERENTIAL - Abnormal; Notable for the following:    WBC 14.5 (*)    Hemoglobin 17.1 (*)    Neutro Abs 10.6 (*)    All other components within normal limits  COMPREHENSIVE METABOLIC PANEL - Abnormal; Notable for the following:    Glucose, Bld 133 (*)    All other components within normal limits  URINE CULTURE  LIPASE, BLOOD  URINALYSIS, ROUTINE W REFLEX MICROSCOPIC   Imaging Review No results found.  EKG Interpretation   None       MDM   1. Nausea and vomiting     12:19 PM Patient will have fluids and zofran for symptoms. Vitals stable and patient afebrile. Labs and urinalysis pending.   3:51 PM Labs show elevated WBC likely from vomiting. Vitals stable and patient afebrile. Patient will be discharged with Phenergan to take as needed for nausea. Patient instructed to follow up with PCP.     Emilia Beck, PA-C 08/11/13 1558

## 2013-08-12 LAB — URINE CULTURE
Colony Count: NO GROWTH
Special Requests: NORMAL

## 2013-08-19 NOTE — ED Provider Notes (Signed)
Medical screening examination/treatment/procedure(s) were performed by non-physician practitioner and as supervising physician I was immediately available for consultation/collaboration.  EKG Interpretation   None         Celene Kras, MD 08/19/13 1622

## 2014-08-27 ENCOUNTER — Emergency Department (HOSPITAL_BASED_OUTPATIENT_CLINIC_OR_DEPARTMENT_OTHER): Payer: Medicaid Other

## 2014-08-27 ENCOUNTER — Encounter (HOSPITAL_BASED_OUTPATIENT_CLINIC_OR_DEPARTMENT_OTHER): Payer: Self-pay | Admitting: Emergency Medicine

## 2014-08-27 ENCOUNTER — Emergency Department (HOSPITAL_BASED_OUTPATIENT_CLINIC_OR_DEPARTMENT_OTHER)
Admission: EM | Admit: 2014-08-27 | Discharge: 2014-08-27 | Disposition: A | Discharge status: Home / Self Care | Payer: Medicaid Other | Attending: Emergency Medicine | Admitting: Emergency Medicine

## 2014-08-27 DIAGNOSIS — R05 Cough: Secondary | ICD-10-CM

## 2014-08-27 DIAGNOSIS — Y998 Other external cause status: Secondary | ICD-10-CM | POA: Diagnosis not present

## 2014-08-27 DIAGNOSIS — Z792 Long term (current) use of antibiotics: Secondary | ICD-10-CM | POA: Insufficient documentation

## 2014-08-27 DIAGNOSIS — S8991XA Unspecified injury of right lower leg, initial encounter: Secondary | ICD-10-CM | POA: Insufficient documentation

## 2014-08-27 DIAGNOSIS — J45901 Unspecified asthma with (acute) exacerbation: Secondary | ICD-10-CM | POA: Insufficient documentation

## 2014-08-27 DIAGNOSIS — K219 Gastro-esophageal reflux disease without esophagitis: Secondary | ICD-10-CM | POA: Diagnosis not present

## 2014-08-27 DIAGNOSIS — Y9289 Other specified places as the place of occurrence of the external cause: Secondary | ICD-10-CM | POA: Insufficient documentation

## 2014-08-27 DIAGNOSIS — X58XXXA Exposure to other specified factors, initial encounter: Secondary | ICD-10-CM | POA: Diagnosis not present

## 2014-08-27 DIAGNOSIS — I10 Essential (primary) hypertension: Secondary | ICD-10-CM | POA: Insufficient documentation

## 2014-08-27 DIAGNOSIS — R059 Cough, unspecified: Secondary | ICD-10-CM

## 2014-08-27 DIAGNOSIS — J40 Bronchitis, not specified as acute or chronic: Secondary | ICD-10-CM

## 2014-08-27 DIAGNOSIS — F419 Anxiety disorder, unspecified: Secondary | ICD-10-CM | POA: Diagnosis not present

## 2014-08-27 DIAGNOSIS — Z720 Tobacco use: Secondary | ICD-10-CM | POA: Diagnosis not present

## 2014-08-27 DIAGNOSIS — E669 Obesity, unspecified: Secondary | ICD-10-CM | POA: Insufficient documentation

## 2014-08-27 DIAGNOSIS — Y9389 Activity, other specified: Secondary | ICD-10-CM | POA: Insufficient documentation

## 2014-08-27 DIAGNOSIS — Z79899 Other long term (current) drug therapy: Secondary | ICD-10-CM | POA: Diagnosis not present

## 2014-08-27 DIAGNOSIS — R52 Pain, unspecified: Secondary | ICD-10-CM

## 2014-08-27 HISTORY — DX: Obesity, unspecified: E66.9

## 2014-08-27 HISTORY — DX: Essential (primary) hypertension: I10

## 2014-08-27 MED ORDER — HYDROCODONE-ACETAMINOPHEN 5-325 MG PO TABS: 2.0000 | ORAL_TABLET | ORAL | Status: DC | PRN

## 2014-08-27 MED ORDER — IPRATROPIUM-ALBUTEROL 0.5-2.5 (3) MG/3ML IN SOLN
3.0000 mL | RESPIRATORY_TRACT | Status: DC
  Administered 2014-08-27: 3 mL via RESPIRATORY_TRACT
  Filled 2014-08-27 (×2): qty 3

## 2014-08-27 MED ORDER — OXYCODONE-ACETAMINOPHEN 5-325 MG PO TABS
1.0000 | ORAL_TABLET | Freq: Once | ORAL | Status: AC
  Administered 2014-08-27: 1 via ORAL
  Filled 2014-08-27: qty 1

## 2014-08-27 NOTE — ED Notes (Addendum)
Twisted right knee today.  Has been having pain in right knee from previous injury x3 years.  Today's twisting "Finished what I started 3 years ago".  Also would like to be checked for increased cough.

## 2014-08-27 NOTE — ED Notes (Signed)
NP at bedside.

## 2014-08-27 NOTE — ED Provider Notes (Signed)
CSN: [id number]     Arrival date & time 08/27/14  1921 History   None    Chief Complaint  Patient presents with  . Knee Injury     (Consider location/radiation/quality/duration/timing/severity/associated sxs/prior Treatment) Patient is a 38 y.o. male presenting with knee pain. The history is provided by the patient.  Knee Pain Location:  Knee Time since incident:  10 hours Injury: yes   Mechanism of injury comment:  Twisting Knee location:  R knee Pain details:    Quality:  Shooting and sharp   Radiates to:  Does not radiate   Severity:  Severe   Onset quality:  Sudden   Timing:  Constant   Progression:  Worsening Dislocation: no   Foreign body present:  No foreign bodies Prior injury to area:  Yes Relieved by:  Nothing Worsened by:  Bearing weight and activity Ineffective treatments:  None tried Risk factors: obesity    Steven Dudley is a 38 y.o. male who presents to the ED with right knee pain that started after he was helping someone move a water heater and twisted his knee. He bought a knee sleeve and took a Vicodin that he had left over from teeth surgery. He continues to have pain and decided to come in for x-ray and referral to ortho. He injured the knee 3 years ago when he fell off a ladder and hit his knee. He has had pain off and on since then but today when he re injured it the pain is severe. Patient also complains of cough and congestion and has a history of bronchitis and wanted to get a chest x-ray tonight.   Past Medical History  Diagnosis Date  . Gastritis   . Acid reflux   . Asthma   . Anxiety   . Hypertension   . Obesity    Past Surgical History  Procedure Laterality Date  . Appendectomy    . Tonsillectomy    . Adenoidectomy     No family history on file. History  Substance Use Topics  . Smoking status: Current Every Day Smoker -- 1.50 packs/day  . Smokeless tobacco: Not on file  . Alcohol Use: Yes     Comment: occ    Review of  Systems Negative except as stated in HPI   Allergies  Shellfish allergy  Home Medications   Prior to Admission medications   Medication Sig Start Date End Date Taking? Authorizing Provider  busPIRone (BUSPAR) 10 MG tablet Take 10 mg by mouth 2 (two) times daily.   Yes Historical Provider, MD  losartan (COZAAR) 100 MG tablet Take 100 mg by mouth daily.   Yes Historical Provider, MD  ranitidine (ZANTAC) 150 MG tablet Take 150 mg by mouth 2 (two) times daily.   Yes Historical Provider, MD  ALPRAZolam Prudy Feeler(XANAX) 0.5 MG tablet Take 0.5 mg by mouth at bedtime as needed. For anxiety.    Historical Provider, MD  ciprofloxacin (CIPRO) 500 MG tablet Take 500 mg by mouth 2 (two) times daily. 06/26/12   Linwood DibblesJon Knapp, MD  HYDROcodone-acetaminophen (NORCO/VICODIN) 5-325 MG per tablet Take 2 tablets by mouth every 6 (six) hours as needed for pain. 10/25/12   Elson AreasLeslie K Sofia, PA-C  LORazepam (ATIVAN) 0.5 MG tablet Take 0.5 mg by mouth every 8 (eight) hours.    Historical Provider, MD  omeprazole (PRILOSEC) 20 MG capsule Take 1 capsule (20 mg total) by mouth daily. 10/25/12   Elson AreasLeslie K Sofia, PA-C  ondansetron (ZOFRAN ODT) 8 MG disintegrating  tablet Take 1 tablet (8 mg total) by mouth every 8 (eight) hours as needed for nausea. 06/26/12   Linwood Dibbles, MD  ondansetron (ZOFRAN) 8 MG tablet Take 1 tablet (8 mg total) by mouth every 8 (eight) hours as needed for nausea. 06/27/12   Doug Sou, MD  pantoprazole (PROTONIX) 20 MG tablet Take 2 tablets (40 mg total) by mouth daily. 06/26/12   Linwood Dibbles, MD  promethazine (PHENERGAN) 25 MG tablet Take 1 tablet (25 mg total) by mouth every 6 (six) hours as needed for nausea. 06/26/12   Linwood Dibbles, MD  promethazine (PHENERGAN) 25 MG tablet Take 1 tablet (25 mg total) by mouth every 6 (six) hours as needed for nausea. 10/25/12   Elson Areas, PA-C  promethazine (PHENERGAN) 25 MG tablet Take 1 tablet (25 mg total) by mouth every 6 (six) hours as needed for nausea. 08/11/13   Kaitlyn  Szekalski, PA-C  traMADol (ULTRAM) 50 MG tablet Take 50 mg by mouth every 6 (six) hours as needed. For pain.    Historical Provider, MD   BP 164/90 mmHg  Pulse 98  Temp(Src) 99.2 F (37.3 C) (Oral)  Resp 20  Ht 5\' 7"  (1.702 m)  Wt 265 lb (120.203 kg)  BMI 41.50 kg/m2  SpO2 91% Physical Exam  Constitutional: He is oriented to person, place, and time. He appears well-developed and well-nourished.  HENT:  Head: Normocephalic.  Eyes: EOM are normal.  Neck: Neck supple.  Cardiovascular: Normal rate.   Pulmonary/Chest: Effort normal. He has wheezes. He has no rales.  Abdominal: Soft. There is no tenderness.  Musculoskeletal:       Right knee: He exhibits decreased range of motion (due to pain) and swelling. He exhibits no deformity, no laceration, no erythema and normal alignment. Tenderness found. MCL tenderness noted.       Legs: Pedal pulses equal, adequate circulation, good touch sensation. Pain with range of motion and with palpation over the MCL.   Neurological: He is alert and oriented to person, place, and time. He has normal strength. No cranial nerve deficit or sensory deficit. Gait (due to pain) abnormal.  Skin: Skin is warm and dry.  Psychiatric: He has a normal mood and affect. His behavior is normal.  Nursing note and vitals reviewed.   ED Course  Procedures ( Dg Chest 2 View  08/27/2014   CLINICAL DATA:  Cough and shortness of Breath  EXAM: CHEST  2 VIEW  COMPARISON:  10/06/2010  FINDINGS: Cardiac shadow is stable. The lungs are well aerated bilaterally without focal infiltrate or sizable effusion. Mild chronic bronchitic changes are again seen. The bony structures are stable.  IMPRESSION: No acute abnormality noted.   Electronically Signed   By: Alcide Clever M.D.   On: 08/27/2014 20:39   Dg Knee Complete 4 Views Right  08/27/2014   CLINICAL DATA:  Twisting injury with pain, initial encounter  EXAM: RIGHT KNEE - COMPLETE 4+ VIEW  COMPARISON:  None.  FINDINGS: There is  no evidence of fracture, dislocation, or joint effusion. There is no evidence of arthropathy or other focal bone abnormality. Soft tissues are unremarkable.  IMPRESSION: No acute abnormality noted.   Electronically Signed   By: Alcide Clever M.D.   On: 08/27/2014 20:33    MDM  38 y.o. male with right knee pain s/p injury earlier today. Sable for discharge without neurovascular deficits. Knee immobilizer applied, pain management, ice, elevation and follow up with ortho. Patient also with bronchitis that is  worsened by his every day smoking of one and one half packs of cigarettes. He improved after a DuoNeb. He has an inhaler at home that he will use as needed. He is stable for discharge with O2 Sat of 95% on R/A. He has no respiratory distress. He plans for follow up with his PCP in the next few days. He will return for worsening symptoms.    Medication List    ASK your doctor about these medications        ALPRAZolam 0.5 MG tablet  Commonly known as:  XANAX  Take 0.5 mg by mouth at bedtime as needed. For anxiety.     busPIRone 10 MG tablet  Commonly known as:  BUSPAR  Take 10 mg by mouth 2 (two) times daily.     ciprofloxacin 500 MG tablet  Commonly known as:  CIPRO  Take 500 mg by mouth 2 (two) times daily.     HYDROcodone-acetaminophen 5-325 MG per tablet  Commonly known as:  NORCO/VICODIN  Take 2 tablets by mouth every 6 (six) hours as needed for pain.  Ask about: Which instructions should I use?     HYDROcodone-acetaminophen 5-325 MG per tablet  Commonly known as:  NORCO/VICODIN  Take 2 tablets by mouth every 4 (four) hours as needed.  Ask about: Which instructions should I use?     LORazepam 0.5 MG tablet  Commonly known as:  ATIVAN  Take 0.5 mg by mouth every 8 (eight) hours.     losartan 100 MG tablet  Commonly known as:  COZAAR  Take 100 mg by mouth daily.     omeprazole 20 MG capsule  Commonly known as:  PRILOSEC  Take 1 capsule (20 mg total) by mouth daily.      ondansetron 8 MG disintegrating tablet  Commonly known as:  ZOFRAN ODT  Take 1 tablet (8 mg total) by mouth every 8 (eight) hours as needed for nausea.     ondansetron 8 MG tablet  Commonly known as:  ZOFRAN  Take 1 tablet (8 mg total) by mouth every 8 (eight) hours as needed for nausea.     pantoprazole 20 MG tablet  Commonly known as:  PROTONIX  Take 2 tablets (40 mg total) by mouth daily.     promethazine 25 MG tablet  Commonly known as:  PHENERGAN  Take 1 tablet (25 mg total) by mouth every 6 (six) hours as needed for nausea.     promethazine 25 MG tablet  Commonly known as:  PHENERGAN  Take 1 tablet (25 mg total) by mouth every 6 (six) hours as needed for nausea.     promethazine 25 MG tablet  Commonly known as:  PHENERGAN  Take 1 tablet (25 mg total) by mouth every 6 (six) hours as needed for nausea.     ranitidine 150 MG tablet  Commonly known as:  ZANTAC  Take 150 mg by mouth 2 (two) times daily.     traMADol 50 MG tablet  Commonly known as:  ULTRAM  Take 50 mg by mouth every 6 (six) hours as needed. For pain.         Final diagnoses:  Pain  Cough        Steven NapoleonHope M Naarah Borgerding, NP 08/27/14 2147  Audree CamelScott T Goldston, MD 09/03/14 [phone]

## 2014-09-01 ENCOUNTER — Ambulatory Visit (INDEPENDENT_AMBULATORY_CARE_PROVIDER_SITE_OTHER): Payer: Medicaid Other | Admitting: Family Medicine

## 2014-09-01 ENCOUNTER — Encounter: Payer: Self-pay | Admitting: Family Medicine

## 2014-09-01 VITALS — BP 132/89 | HR 79 | Ht 67.0 in | Wt 265.0 lb

## 2014-09-01 DIAGNOSIS — S8991XA Unspecified injury of right lower leg, initial encounter: Secondary | ICD-10-CM

## 2014-09-01 MED ORDER — HYDROCODONE-ACETAMINOPHEN 5-325 MG PO TABS: 1.0000 | ORAL_TABLET | Freq: Four times a day (QID) | ORAL | Status: DC | PRN

## 2014-09-01 NOTE — Patient Instructions (Signed)
I'm concerned you tore your ACl and medial meniscus in this knee. Wear knee brace for support. Icing, elevating as much as possible. Norco as needed for severe pain. We will go ahead with an MRI on this knee - I'll contact you the day following this to go over results.

## 2014-09-03 NOTE — Addendum Note (Signed)
Addended by: Kathi SimpersWISE, Julious Langlois F on: 09/03/2014 01:57 PM   Modules accepted: Orders

## 2014-09-03 NOTE — Progress Notes (Addendum)
Patient ID: Sampson GoonMatthew Poke, male   DOB: 1976/01/08, 38 y.o.   MRN: [id number]  PCP: Garth SchlatterAMEEN, WILLIAM OTIS, MD  Subjective:   HPI: Patient is a 38 y.o. male here for right knee injury.  Patient reports 3-5 years ago he was up on a ladder when he fell off and hit medial knee hard on part of a ladder. Has been aching since that time but did not seek care. Then was helping a friend with a water heater a week ago when he turned and felt a pop within right knee. + swelling and popping. Throbbing, burning sensation within this knee. No catching or locking. Feels unstable. Radiographs on 11/18 negative for fracture.  Past Medical History  Diagnosis Date  . Gastritis   . Acid reflux   . Asthma   . Anxiety   . Hypertension   . Obesity     Current Outpatient Prescriptions on File Prior to Visit  Medication Sig Dispense Refill  . busPIRone (BUSPAR) 10 MG tablet Take 10 mg by mouth 2 (two) times daily.    Marland Kitchen. LORazepam (ATIVAN) 0.5 MG tablet Take 0.5 mg by mouth every 8 (eight) hours.    Marland Kitchen. losartan (COZAAR) 100 MG tablet Take 100 mg by mouth daily.    Marland Kitchen. omeprazole (PRILOSEC) 20 MG capsule Take 1 capsule (20 mg total) by mouth daily. 20 capsule 0  . ranitidine (ZANTAC) 150 MG tablet Take 150 mg by mouth 2 (two) times daily.    . traMADol (ULTRAM) 50 MG tablet Take 50 mg by mouth every 6 (six) hours as needed. For pain.     No current facility-administered medications on file prior to visit.    Past Surgical History  Procedure Laterality Date  . Appendectomy    . Tonsillectomy    . Adenoidectomy      Allergies  Allergen Reactions  . Shellfish Allergy Nausea Only and Other (See Comments)    Headache, heat flashes.    History   Social History  . Marital Status: Married    Spouse Name: N/A    Number of Children: N/A  . Years of Education: N/A   Occupational History  . Not on file.   Social History Main Topics  . Smoking status: Current Every Day Smoker -- 1.50 packs/day  .  Smokeless tobacco: Not on file  . Alcohol Use: 0.0 oz/week    0 Not specified per week     Comment: occ  . Drug Use: Yes    Special: Marijuana  . Sexual Activity: Not on file   Other Topics Concern  . Not on file   Social History Narrative    No family history on file.  BP 132/89 mmHg  Pulse 79  Ht 5\' 7"  (1.702 m)  Wt 265 lb (120.203 kg)  BMI 41.50 kg/m2  Review of Systems: See HPI above.    Objective:  Physical Exam:  Gen: NAD  Right knee: No gross deformity, ecchymoses.  Mild effusion. TTP medial joint line.  No other tenderness. ROM 0 - 110 degrees. 1+ ant drawer and lachmanns.  Negative post drawer.  Negative valgus/varus testing. Positive mcmurrays, apleys.  Negative patellar apprehension. NV intact distally.    Assessment & Plan:  1. Right knee injury - concerning for medial meniscus tear and ACL tears.  Will go ahead with MRI to further assess.  Knee brace for support.  Icing, elevation, norco as needed.  Addendum:  MRI reviewed and discussed with patient.  He does have  a complex medial meniscus tear.  Will refer to ortho for discussion of arthroscopic debridement.

## 2014-09-06 ENCOUNTER — Ambulatory Visit (HOSPITAL_BASED_OUTPATIENT_CLINIC_OR_DEPARTMENT_OTHER)
Admission: RE | Admit: 2014-09-06 | Discharge: 2014-09-06 | Disposition: A | Discharge status: Home / Self Care | Payer: Medicaid Other | Source: Ambulatory Visit | Attending: Family Medicine | Admitting: Family Medicine

## 2014-09-06 DIAGNOSIS — Y9389 Activity, other specified: Secondary | ICD-10-CM | POA: Diagnosis not present

## 2014-09-06 DIAGNOSIS — S8991XA Unspecified injury of right lower leg, initial encounter: Secondary | ICD-10-CM

## 2014-09-06 DIAGNOSIS — S83241A Other tear of medial meniscus, current injury, right knee, initial encounter: Secondary | ICD-10-CM | POA: Insufficient documentation

## 2014-09-06 DIAGNOSIS — M25561 Pain in right knee: Secondary | ICD-10-CM | POA: Insufficient documentation

## 2014-09-09 DIAGNOSIS — M23009 Cystic meniscus, unspecified meniscus, unspecified knee: Secondary | ICD-10-CM

## 2014-09-09 DIAGNOSIS — M2241 Chondromalacia patellae, right knee: Secondary | ICD-10-CM

## 2014-09-09 HISTORY — DX: Cystic meniscus, unspecified meniscus, unspecified knee: M23.009

## 2014-09-09 HISTORY — DX: Chondromalacia patellae, right knee: M22.41

## 2014-09-09 MED ORDER — TRAMADOL HCL 50 MG PO TABS: 50.0000 mg | ORAL_TABLET | Freq: Three times a day (TID) | ORAL | Status: DC

## 2014-09-09 NOTE — Addendum Note (Signed)
Addended by: Kathi SimpersWISE, Almir Botts F on: 09/09/2014 04:31 PM   Modules accepted: Orders

## 2014-09-09 NOTE — Addendum Note (Signed)
Addended by: Kathi SimpersWISE, Morrigan Wickens F on: 09/09/2014 04:45 PM   Modules accepted: Orders

## 2014-09-19 ENCOUNTER — Other Ambulatory Visit: Payer: Self-pay | Admitting: Physician Assistant

## 2014-09-22 ENCOUNTER — Ambulatory Visit (HOSPITAL_COMMUNITY): Admission: RE | Admit: 2014-09-22 | Payer: Medicaid Other | Source: Ambulatory Visit

## 2014-09-22 ENCOUNTER — Encounter (HOSPITAL_BASED_OUTPATIENT_CLINIC_OR_DEPARTMENT_OTHER)
Admission: RE | Admit: 2014-09-22 | Discharge: 2014-09-22 | Disposition: A | Discharge status: Home / Self Care | Payer: Medicaid Other | Source: Ambulatory Visit | Attending: Orthopedic Surgery | Admitting: Orthopedic Surgery

## 2014-09-22 ENCOUNTER — Encounter (HOSPITAL_BASED_OUTPATIENT_CLINIC_OR_DEPARTMENT_OTHER): Payer: Self-pay | Admitting: *Deleted

## 2014-09-22 DIAGNOSIS — F17210 Nicotine dependence, cigarettes, uncomplicated: Secondary | ICD-10-CM | POA: Diagnosis not present

## 2014-09-22 DIAGNOSIS — I10 Essential (primary) hypertension: Secondary | ICD-10-CM | POA: Diagnosis not present

## 2014-09-22 DIAGNOSIS — K219 Gastro-esophageal reflux disease without esophagitis: Secondary | ICD-10-CM | POA: Diagnosis not present

## 2014-09-22 DIAGNOSIS — M2241 Chondromalacia patellae, right knee: Secondary | ICD-10-CM | POA: Diagnosis present

## 2014-09-22 DIAGNOSIS — J45909 Unspecified asthma, uncomplicated: Secondary | ICD-10-CM | POA: Diagnosis not present

## 2014-09-22 DIAGNOSIS — Z720 Tobacco use: Secondary | ICD-10-CM | POA: Diagnosis not present

## 2014-09-22 DIAGNOSIS — F419 Anxiety disorder, unspecified: Secondary | ICD-10-CM | POA: Diagnosis not present

## 2014-09-22 DIAGNOSIS — X58XXXA Exposure to other specified factors, initial encounter: Secondary | ICD-10-CM | POA: Diagnosis not present

## 2014-09-22 DIAGNOSIS — S83241A Other tear of medial meniscus, current injury, right knee, initial encounter: Secondary | ICD-10-CM | POA: Diagnosis not present

## 2014-09-22 DIAGNOSIS — Z6841 Body Mass Index (BMI) 40.0 and over, adult: Secondary | ICD-10-CM | POA: Diagnosis not present

## 2014-09-22 LAB — BASIC METABOLIC PANEL
Anion gap: 11 (ref 5–15)
BUN: 10 mg/dL (ref 6–23)
CO2: 33 meq/L — AB (ref 19–32)
Calcium: 9.3 mg/dL (ref 8.4–10.5)
Chloride: 100 mEq/L (ref 96–112)
Creatinine, Ser: 0.92 mg/dL (ref 0.50–1.35)
GFR calc Af Amer: >90 mL/min (ref >90)
GFR calc non Af Amer: >90 mL/min (ref >90)
Glucose, Bld: 87 mg/dL (ref 70–99)
POTASSIUM: 4 meq/L (ref 3.7–5.3)
SODIUM: 144 meq/L (ref 137–147)

## 2014-09-22 NOTE — Pre-Procedure Instructions (Signed)
To come for BMET and EKG 

## 2014-09-22 NOTE — Progress Notes (Signed)
   09/22/14 1227  OBSTRUCTIVE SLEEP APNEA  Have you ever been diagnosed with sleep apnea through a sleep study? No  Do you snore loudly (loud enough to be heard through closed doors)?  1  Do you often feel tired, fatigued, or sleepy during the daytime? 1  Has anyone observed you stop breathing during your sleep? 0  Do you have, or are you being treated for high blood pressure? 1  BMI more than 35 kg/m2? 1  Age over 37 years old? 0  Gender: 1  Obstructive Sleep Apnea Score 5  Score 4 or greater  Results sent to PCP (Dr. Delbert HarnessKim Briscoe)

## 2014-09-22 NOTE — Evaluation (Signed)
Seen in preop. Obese, asthmatic, Mallimpatti 4, c full beard and smokes 1.5 packs daily. On exam he was wheezing somewhat. Counseled to bring and use his inhalers DOS. Recent CXR noted. Real issue likely his airway

## 2014-09-24 NOTE — H&P (Signed)
  [facility] [address]   [address] [place], [place] [id number] [phone] A Division of [facility]  Loreta Aveaniel F. Leoma Folds, M.D.   Robert A. Thurston HoleWainer, M.D.   Burnell BlanksW. Dan Caffrey, M.D.   Eulas PostJoshua P. Landau, M.D.   Lunette StandsAnna Voytek, M.D Jewel Baizeimothy D. Eulah PontMurphy, M.D.  Buford DresserWesley R. Ibazebo, M.D.  Estell HarpinJames S. Kramer, M.D.    Melina Fiddlerebecca S. Bassett, M.D. Mary L. Isidoro DonningAnton, PA-C  Kirstin A. Shepperson, PA-C  Josh Conradhadwell, PA-C GoodlandBrandon Parry, [place] DakotaOPA-C   RE: Sampson GoonBarbieri, Tidus   [phone]      DOB: 1975-12-09 PROGRESS NOTE: 09-16-14 Molli HazardMatthew is a new patient who presents for his right knee. Referred by Dr. Pearletha ForgeHudnall. He had an injury in November. Vertical load flexion twisting and felt a pop. He has had marked medial mechanical symptoms since. He had a minor injury 5 years ago and his knee has bothered him a little but since this recent event completely intolerable.  Non-weightbearing x-rays show no fracture. Eventual MRI shows large medial meniscus tear. Ligaments otherwise intact. Really no significant degenerative change. I looked at his previous notes MRI report and scan and his non-weightbearing x-rays. He is a Chiropractorself-employed contractor.  Remaining history general exam is outlined included in the chart.  EXAMINATION: 38 year old male. 5'7" 270 pounds. Antalgic gait on the right. He is neurovascularly intact upper and lower extremities. Right knee has point tenderness medial joint line positive McMurray's. Lacks full extension by about 5 degrees. Ligaments stable. Reactive effusion. He is neurovascularly intact distally. Left knee has good motion no joint line tenderness no grating or crepitus.  X-RAYS: 4 view standing x-rays show a little medial narrowing both knees nothing extreme.  IMPRESSION: Right knee medial meniscus tear.  DISPOSITION: Diagnosis and treatment options discussed. Sufficient symptoms to warrant intervention. More than 25 minutes spent face-to-face  covering this with him. Discussed exam under anesthesia arthroscopy partial meniscectomy. What to anticipate intra and post-op reviewed. We will set this up in the near future. Prescription given for Percocet to use cautiously until surgery.  Loreta Aveaniel F. Bohdan Macho, M.D.  Electronically verified by Loreta Aveaniel F. Malea Swilling, M.D. DFM:kah D 09-16-14 T 09-17-14

## 2014-09-25 ENCOUNTER — Ambulatory Visit (HOSPITAL_BASED_OUTPATIENT_CLINIC_OR_DEPARTMENT_OTHER): Payer: Medicaid Other | Admitting: Anesthesiology

## 2014-09-25 ENCOUNTER — Encounter (HOSPITAL_BASED_OUTPATIENT_CLINIC_OR_DEPARTMENT_OTHER)
Admission: RE | Disposition: A | Discharge status: Home / Self Care | Payer: Self-pay | Source: Ambulatory Visit | Attending: Orthopedic Surgery

## 2014-09-25 ENCOUNTER — Ambulatory Visit (HOSPITAL_BASED_OUTPATIENT_CLINIC_OR_DEPARTMENT_OTHER)
Admission: RE | Admit: 2014-09-25 | Discharge: 2014-09-25 | Disposition: A | Discharge status: Home / Self Care | Payer: Medicaid Other | Source: Ambulatory Visit | Attending: Orthopedic Surgery | Admitting: Orthopedic Surgery

## 2014-09-25 ENCOUNTER — Encounter (HOSPITAL_BASED_OUTPATIENT_CLINIC_OR_DEPARTMENT_OTHER): Payer: Self-pay | Admitting: *Deleted

## 2014-09-25 DIAGNOSIS — I10 Essential (primary) hypertension: Secondary | ICD-10-CM | POA: Insufficient documentation

## 2014-09-25 DIAGNOSIS — M2241 Chondromalacia patellae, right knee: Secondary | ICD-10-CM | POA: Diagnosis not present

## 2014-09-25 DIAGNOSIS — S83241A Other tear of medial meniscus, current injury, right knee, initial encounter: Secondary | ICD-10-CM | POA: Diagnosis not present

## 2014-09-25 DIAGNOSIS — J45909 Unspecified asthma, uncomplicated: Secondary | ICD-10-CM | POA: Insufficient documentation

## 2014-09-25 DIAGNOSIS — F17210 Nicotine dependence, cigarettes, uncomplicated: Secondary | ICD-10-CM | POA: Diagnosis not present

## 2014-09-25 DIAGNOSIS — X58XXXA Exposure to other specified factors, initial encounter: Secondary | ICD-10-CM | POA: Insufficient documentation

## 2014-09-25 DIAGNOSIS — Z01810 Encounter for preprocedural cardiovascular examination: Secondary | ICD-10-CM

## 2014-09-25 DIAGNOSIS — Z6841 Body Mass Index (BMI) 40.0 and over, adult: Secondary | ICD-10-CM | POA: Insufficient documentation

## 2014-09-25 DIAGNOSIS — Z720 Tobacco use: Secondary | ICD-10-CM | POA: Insufficient documentation

## 2014-09-25 DIAGNOSIS — F419 Anxiety disorder, unspecified: Secondary | ICD-10-CM | POA: Insufficient documentation

## 2014-09-25 DIAGNOSIS — K219 Gastro-esophageal reflux disease without esophagitis: Secondary | ICD-10-CM | POA: Insufficient documentation

## 2014-09-25 HISTORY — DX: Cystic meniscus, unspecified meniscus, unspecified knee: M23.009

## 2014-09-25 HISTORY — PX: KNEE ARTHROSCOPY WITH MEDIAL MENISECTOMY: SHX5651

## 2014-09-25 HISTORY — DX: Personal history of traumatic brain injury: Z87.820

## 2014-09-25 HISTORY — DX: Chondromalacia patellae, right knee: M22.41

## 2014-09-25 HISTORY — DX: Simple chronic bronchitis: J41.0

## 2014-09-25 SURGERY — ARTHROSCOPY, KNEE, WITH MEDIAL MENISCECTOMY
Anesthesia: General | Site: Knee | Laterality: Right

## 2014-09-25 MED ORDER — HYDROMORPHONE HCL 1 MG/ML IJ SOLN: 0.5000 mg | INTRAMUSCULAR | Status: DC | PRN

## 2014-09-25 MED ORDER — CEFAZOLIN SODIUM 1-5 GM-% IV SOLN
INTRAVENOUS | Status: AC
  Filled 2014-09-25: qty 50

## 2014-09-25 MED ORDER — BUPIVACAINE HCL (PF) 0.5 % IJ SOLN
INTRAMUSCULAR | Status: AC
  Filled 2014-09-25: qty 30

## 2014-09-25 MED ORDER — OXYCODONE HCL 5 MG PO TABS: 5.0000 mg | ORAL_TABLET | ORAL | Status: DC | PRN

## 2014-09-25 MED ORDER — HYDROMORPHONE HCL 1 MG/ML IJ SOLN
0.2500 mg | INTRAMUSCULAR | Status: DC | PRN
  Administered 2014-09-25 (×3): 0.25 mg via INTRAVENOUS
  Administered 2014-09-25: 0.5 mg via INTRAVENOUS
  Administered 2014-09-25: 0.25 mg via INTRAVENOUS
  Administered 2014-09-25: 0.5 mg via INTRAVENOUS

## 2014-09-25 MED ORDER — BUPIVACAINE HCL (PF) 0.5 % IJ SOLN
INTRAMUSCULAR | Status: DC | PRN
Start: 2014-09-25 — End: 2014-09-25
  Administered 2014-09-25: 20 mL

## 2014-09-25 MED ORDER — PROPOFOL 10 MG/ML IV EMUL
INTRAVENOUS | Status: AC
  Filled 2014-09-25: qty 50

## 2014-09-25 MED ORDER — PROPOFOL 10 MG/ML IV BOLUS
INTRAVENOUS | Status: DC | PRN
  Administered 2014-09-25: 200 mg via INTRAVENOUS

## 2014-09-25 MED ORDER — MIDAZOLAM HCL 2 MG/2ML IJ SOLN
INTRAMUSCULAR | Status: AC
  Filled 2014-09-25: qty 2

## 2014-09-25 MED ORDER — LIDOCAINE HCL (CARDIAC) 20 MG/ML IV SOLN
INTRAVENOUS | Status: DC | PRN
  Administered 2014-09-25: 80 mg via INTRAVENOUS

## 2014-09-25 MED ORDER — METHYLPREDNISOLONE ACETATE 80 MG/ML IJ SUSP
INTRAMUSCULAR | Status: AC
  Filled 2014-09-25: qty 1

## 2014-09-25 MED ORDER — FENTANYL CITRATE 0.05 MG/ML IJ SOLN: 50.0000 ug | INTRAMUSCULAR | Status: DC | PRN

## 2014-09-25 MED ORDER — FENTANYL CITRATE 0.05 MG/ML IJ SOLN
INTRAMUSCULAR | Status: AC
  Filled 2014-09-25: qty 6

## 2014-09-25 MED ORDER — MIDAZOLAM HCL 5 MG/5ML IJ SOLN
INTRAMUSCULAR | Status: DC | PRN
  Administered 2014-09-25: 2 mg via INTRAVENOUS

## 2014-09-25 MED ORDER — OXYCODONE HCL 5 MG PO TABS
5.0000 mg | ORAL_TABLET | Freq: Once | ORAL | Status: AC | PRN
  Administered 2014-09-25: 5 mg via ORAL

## 2014-09-25 MED ORDER — LACTATED RINGERS IV SOLN: INTRAVENOUS | Status: DC

## 2014-09-25 MED ORDER — DEXAMETHASONE SODIUM PHOSPHATE 4 MG/ML IJ SOLN
INTRAMUSCULAR | Status: DC | PRN
  Administered 2014-09-25: 10 mg via INTRAVENOUS

## 2014-09-25 MED ORDER — FENTANYL CITRATE 0.05 MG/ML IJ SOLN
INTRAMUSCULAR | Status: DC | PRN
  Administered 2014-09-25: 100 ug via INTRAVENOUS
  Administered 2014-09-25: 50 ug via INTRAVENOUS

## 2014-09-25 MED ORDER — METHOCARBAMOL 500 MG PO TABS: 500.0000 mg | ORAL_TABLET | Freq: Four times a day (QID) | ORAL | Status: DC | PRN

## 2014-09-25 MED ORDER — CEFAZOLIN SODIUM 10 G IJ SOLR
3.0000 g | INTRAMUSCULAR | Status: AC
  Administered 2014-09-25: 3 g via INTRAVENOUS

## 2014-09-25 MED ORDER — OXYCODONE HCL 5 MG/5ML PO SOLN: 5.0000 mg | Freq: Once | ORAL | Status: AC | PRN

## 2014-09-25 MED ORDER — METHOCARBAMOL 1000 MG/10ML IJ SOLN: 500.0000 mg | Freq: Four times a day (QID) | INTRAVENOUS | Status: DC | PRN

## 2014-09-25 MED ORDER — METHYLPREDNISOLONE ACETATE 40 MG/ML IJ SUSP
INTRAMUSCULAR | Status: AC
Start: 2014-09-25 — End: 2014-09-25
  Filled 2014-09-25: qty 1

## 2014-09-25 MED ORDER — CHLORHEXIDINE GLUCONATE 4 % EX LIQD: 60.0000 mL | Freq: Once | CUTANEOUS | Status: DC

## 2014-09-25 MED ORDER — PROMETHAZINE HCL 25 MG/ML IJ SOLN: 6.2500 mg | INTRAMUSCULAR | Status: DC | PRN

## 2014-09-25 MED ORDER — BUPIVACAINE HCL (PF) 0.25 % IJ SOLN
INTRAMUSCULAR | Status: AC
  Filled 2014-09-25: qty 30

## 2014-09-25 MED ORDER — ONDANSETRON HCL 4 MG/2ML IJ SOLN: 4.0000 mg | Freq: Four times a day (QID) | INTRAMUSCULAR | Status: DC | PRN

## 2014-09-25 MED ORDER — METOCLOPRAMIDE HCL 5 MG/ML IJ SOLN: 5.0000 mg | Freq: Three times a day (TID) | INTRAMUSCULAR | Status: DC | PRN

## 2014-09-25 MED ORDER — HYDROMORPHONE HCL 1 MG/ML IJ SOLN
INTRAMUSCULAR | Status: AC
  Filled 2014-09-25: qty 1

## 2014-09-25 MED ORDER — OXYCODONE HCL 5 MG PO TABS
ORAL_TABLET | ORAL | Status: AC
  Filled 2014-09-25: qty 1

## 2014-09-25 MED ORDER — MIDAZOLAM HCL 2 MG/2ML IJ SOLN: 1.0000 mg | INTRAMUSCULAR | Status: DC | PRN

## 2014-09-25 MED ORDER — ONDANSETRON HCL 4 MG PO TABS: 4.0000 mg | ORAL_TABLET | Freq: Four times a day (QID) | ORAL | Status: DC | PRN

## 2014-09-25 MED ORDER — LACTATED RINGERS IV SOLN
INTRAVENOUS | Status: DC
  Administered 2014-09-25 (×2): via INTRAVENOUS

## 2014-09-25 MED ORDER — METHYLPREDNISOLONE ACETATE 80 MG/ML IJ SUSP
INTRAMUSCULAR | Status: DC | PRN
  Administered 2014-09-25: 80 mg

## 2014-09-25 MED ORDER — SODIUM CHLORIDE 0.9 % IR SOLN
Status: DC | PRN
  Administered 2014-09-25: 6000 mL

## 2014-09-25 MED ORDER — METOCLOPRAMIDE HCL 5 MG PO TABS: 5.0000 mg | ORAL_TABLET | Freq: Three times a day (TID) | ORAL | Status: DC | PRN

## 2014-09-25 MED ORDER — ONDANSETRON HCL 4 MG/2ML IJ SOLN
INTRAMUSCULAR | Status: DC | PRN
  Administered 2014-09-25: 4 mg via INTRAVENOUS

## 2014-09-25 SURGICAL SUPPLY — 37 items
BANDAGE ELASTIC 6 VELCRO ST LF (GAUZE/BANDAGES/DRESSINGS) ×2 IMPLANT
BLADE CUDA 5.5 (BLADE) IMPLANT
BLADE CUDA GRT WHITE 3.5 (BLADE) IMPLANT
BLADE CUTTER GATOR 3.5 (BLADE) ×2 IMPLANT
BLADE CUTTER MENIS 5.5 (BLADE) IMPLANT
BLADE GREAT WHITE 4.2 (BLADE) ×2 IMPLANT
BUR OVAL 4.0 (BURR) IMPLANT
CANISTER SUCT 3000ML (MISCELLANEOUS) IMPLANT
CUTTER MENISCUS  4.2MM (BLADE)
CUTTER MENISCUS 4.2MM (BLADE) IMPLANT
DRAPE ARTHROSCOPY W/POUCH 90 (DRAPES) ×2 IMPLANT
DURAPREP 26ML APPLICATOR (WOUND CARE) ×2 IMPLANT
ELECT MENISCUS 165MM 90D (ELECTRODE) IMPLANT
ELECT REM PT RETURN 9FT ADLT (ELECTROSURGICAL)
ELECTRODE REM PT RTRN 9FT ADLT (ELECTROSURGICAL) IMPLANT
GAUZE SPONGE 4X4 12PLY STRL (GAUZE/BANDAGES/DRESSINGS) ×4 IMPLANT
GAUZE XEROFORM 1X8 LF (GAUZE/BANDAGES/DRESSINGS) ×2 IMPLANT
GLOVE BIOGEL PI IND STRL 7.0 (GLOVE) ×1 IMPLANT
GLOVE BIOGEL PI INDICATOR 7.0 (GLOVE) ×3
GLOVE ECLIPSE 6.5 STRL STRAW (GLOVE) ×1 IMPLANT
GLOVE ECLIPSE 7.0 STRL STRAW (GLOVE) ×2 IMPLANT
GLOVE ORTHO TXT STRL SZ7.5 (GLOVE) ×2 IMPLANT
GOWN STRL REUS W/ TWL LRG LVL3 (GOWN DISPOSABLE) ×3 IMPLANT
GOWN STRL REUS W/TWL LRG LVL3 (GOWN DISPOSABLE) ×6
HOLDER KNEE FOAM BLUE (MISCELLANEOUS) ×2 IMPLANT
IV NS IRRIG 3000ML ARTHROMATIC (IV SOLUTION) ×4 IMPLANT
KNEE WRAP E Z 3 GEL PACK (MISCELLANEOUS) ×2 IMPLANT
MANIFOLD NEPTUNE II (INSTRUMENTS) ×2 IMPLANT
PACK ARTHROSCOPY DSU (CUSTOM PROCEDURE TRAY) ×2 IMPLANT
PACK BASIN DAY SURGERY FS (CUSTOM PROCEDURE TRAY) ×2 IMPLANT
PENCIL BUTTON HOLSTER BLD 10FT (ELECTRODE) IMPLANT
SET ARTHROSCOPY TUBING (MISCELLANEOUS) ×2
SET ARTHROSCOPY TUBING LN (MISCELLANEOUS) ×1 IMPLANT
SUT ETHILON 3 0 PS 1 (SUTURE) ×2 IMPLANT
SUT VIC AB 3-0 FS2 27 (SUTURE) IMPLANT
TOWEL OR 17X24 6PK STRL BLUE (TOWEL DISPOSABLE) ×2 IMPLANT
WATER STERILE IRR 1000ML POUR (IV SOLUTION) ×2 IMPLANT

## 2014-09-25 NOTE — Anesthesia Procedure Notes (Signed)
Procedure Name: LMA Insertion Date/Time: 09/25/2014 9:03 AM Performed by: Gar GibbonKEETON, Kamarii Buren S Pre-anesthesia Checklist: Patient identified, Emergency Drugs available, Suction available and Patient being monitored Patient Re-evaluated:Patient Re-evaluated prior to inductionOxygen Delivery Method: Circle System Utilized Preoxygenation: Pre-oxygenation with 100% oxygen Intubation Type: IV induction Ventilation: Mask ventilation without difficulty LMA: LMA with gastric port inserted LMA Size: 5.0 Number of attempts: 1 Placement Confirmation: positive ETCO2 Tube secured with: Tape Dental Injury: Teeth and Oropharynx as per pre-operative assessment

## 2014-09-25 NOTE — Interval H&P Note (Signed)
History and Physical Interval Note:  09/25/2014 7:19 AM  Steven Dudley  has presented today for surgery, with the diagnosis of CHONDROMALACIA PATELLA RIGHT KNEE/CYSTIC MENISCUS/UNSPECIFIED MENISCUS RIGHT KNEE  The various methods of treatment have been discussed with the patient and family. After consideration of risks, benefits and other options for treatment, the patient has consented to  Procedure(s) with comments: RIGHT KNEE SCOPE CHONDROPLASTY/MEDIAL MENISCECTOMY (Right) - ANESTHESIA: GENERAL, KNEE BLOCK as a surgical intervention .  The patient's history has been reviewed, patient examined, no change in status, stable for surgery.  I have reviewed the patient's chart and labs.  Questions were answered to the patient's satisfaction.     Murielle Stang F

## 2014-09-25 NOTE — Anesthesia Postprocedure Evaluation (Signed)
  2Anesthesia Post-op Note  Patient: Steven Dudley  Procedure(s) Performed: Procedure(s) with comments: RIGHT KNEE SCOPE CHONDROPLASTY/MEDIAL MENISCECTOMY (Right) - ANESTHESIA: GENERAL, KNEE BLOCK  Patient Location: PACU  Anesthesia Type:General  Level of Consciousness: awake and alert   Airway and Oxygen Therapy: Patient Spontanous Breathing  Post-op Pain: none  Post-op Assessment: Post-op Vital signs reviewed  Post-op Vital Signs: Reviewed  Last Vitals:  Filed Vitals:   09/25/14 1159  BP: 156/73  Pulse: 91  Temp: 36.6 C  Resp: 18    Complications: No apparent anesthesia complications

## 2014-09-25 NOTE — Anesthesia Preprocedure Evaluation (Addendum)
Anesthesia Evaluation  Patient identified by MRN, date of birth, ID band Patient awake    Reviewed: Allergy & Precautions, H&P , NPO status , Patient's Chart, lab work & pertinent test results  Airway Mallampati: III  TM Distance: >3 FB Neck ROM: Full    Dental  (+) Teeth Intact   Pulmonary asthma , Current Smoker,  breath sounds clear to auscultation        Cardiovascular hypertension, Pt. on medications Rhythm:Regular Rate:Normal     Neuro/Psych Anxiety negative neurological ROS     GI/Hepatic Neg liver ROS, GERD-  ,  Endo/Other  Morbid obesity  Renal/GU negative Renal ROS     Musculoskeletal negative musculoskeletal ROS (+)   Abdominal   Peds  Hematology negative hematology ROS (+)   Anesthesia Other Findings   Reproductive/Obstetrics                            Anesthesia Physical Anesthesia Plan  ASA: III  Anesthesia Plan: General   Post-op Pain Management:    Induction: Intravenous  Airway Management Planned: LMA  Additional Equipment:   Intra-op Plan:   Post-operative Plan: Extubation in OR  Informed Consent: I have reviewed the patients History and Physical, chart, labs and discussed the procedure including the risks, benefits and alternatives for the proposed anesthesia with the patient or authorized representative who has indicated his/her understanding and acceptance.     Plan Discussed with: CRNA and Surgeon  Anesthesia Plan Comments:         Anesthesia Quick Evaluation

## 2014-09-25 NOTE — Transfer of Care (Signed)
Immediate Anesthesia Transfer of Care Note  Patient: Steven Dudley  Procedure(s) Performed: Procedure(s) with comments: RIGHT KNEE SCOPE CHONDROPLASTY/MEDIAL MENISCECTOMY (Right) - ANESTHESIA: GENERAL, KNEE BLOCK  Patient Location: PACU  Anesthesia Type:General  Level of Consciousness: awake, sedated and patient cooperative  Airway & Oxygen Therapy: Patient Spontanous Breathing and Patient connected to face mask oxygen  Post-op Assessment: Report given to PACU RN and Post -op Vital signs reviewed and stable  Post vital signs: Reviewed and stable  Complications: No apparent anesthesia complications

## 2014-09-25 NOTE — Discharge Instructions (Signed)
Arthroscopic Procedure, Knee, Care After °Refer to this sheet in the next few weeks. These discharge instructions provide you with general information on caring for yourself after you leave the hospital. Your health care provider may also give you specific instructions. Your treatment has been planned according to the most current medical practices available, but unavoidable complications sometimes occur. If you have any problems or questions after discharge, please call your health care provider. °HOME CARE INSTRUCTIONS  ° °Weight bearing as tolerated.  Remove bandages and apply band-aids in 3 days.  May shower in 3 days, but do not soak incisions.  May apply ice for up to 20 minutes at a time for pain and swelling.  Follow up appointment in one week. ° °· It is normal to be sore for a couple days after surgery. See your health care provider if this seems to be getting worse rather than better. °· Only take over-the-counter or prescription medicines for pain, discomfort, or fever as directed by your health care provider. °· Take showers rather than baths, or as directed by your health care provider. °· Change bandages (dressings) if necessary or as directed. °· You may resume normal diet and activities as directed or allowed. °· Avoid lifting and driving until you are directed otherwise. °· Make an appointment to see your health care provider for stitches (suture) or staple removal as directed. °· You may put ice on the area. °· Put the ice in a plastic bag. Place a towel between your skin and the bag. °· Leave the ice on for 15-20 minutes, three to four times per day for the first 2 days. °· Elevate the knee above the level of your heart to reduce swelling, and avoid dangling the leg. °· Do 10-15 ankle pumps (pointing your toes toward you and then away from you) two to three times daily. °· If you are given compression stockings to wear after surgery, use them for as long as your surgeon tells you (around 10-14  days). °· Avoid smoking and exposure to secondhand smoke. °SEEK MEDICAL CARE IF:  °· You have increased bleeding from your wounds. °· You see redness or swelling or you have increasing pain in your wounds. °· You have pus coming from your wound. °· You have a fever or persistent symptoms for more than 2-3 days. °· You notice a bad smell coming from the wound or dressing. °· You have severe pain with any motion of your knee. °SEEK IMMEDIATE MEDICAL CARE IF:  °· You develop a rash. °· You have difficulty breathing. °· You develop any reaction or side effects to medicines taken. °· You develop pain in the calves or back of the knee. °· You develop chest pain, shortness of breath, or difficulty breathing. °· You develop numbness or tingling in the leg or foot. °MAKE SURE YOU:  °· Understand these instructions. °· Will watch your condition. °· Will get help right away if you are not doing well or you get worse. °Document Released: 04/15/2005 Document Revised: 10/01/2013 Document Reviewed: 02/21/2013 °[employer]® Patient Information ©2015 [employer]. This information is not intended to replace advice given to you by your health care provider. Make sure you discuss any questions you have with your health care provider. ° ° °Post Anesthesia Home Care Instructions ° °Activity: °Get plenty of rest for the remainder of the day. A responsible adult should stay with you for 24 hours following the procedure.  °For the next 24 hours, DO NOT: °-Drive a car °-  Operate machinery °-Drink alcoholic beverages °-Take any medication unless instructed by your physician °-Make any legal decisions or sign important papers. ° °Meals: °Start with liquid foods such as gelatin or soup. Progress to regular foods as tolerated. Avoid greasy, spicy, heavy foods. If nausea and/or vomiting occur, drink only clear liquids until the nausea and/or vomiting subsides. Call your physician if vomiting continues. ° °Special Instructions/Symptoms: °Your throat  may feel dry or sore from the anesthesia or the breathing tube placed in your throat during surgery. If this causes discomfort, gargle with warm salt water. The discomfort should disappear within 24 hours. ° ° °

## 2014-09-26 NOTE — Op Note (Signed)
NAMMarland Kitchen:  Steven MerinoBARBIERI, Steven            ACCOUNT NO.:  [id number]  MEDICAL RECORD NO.:  [id number]  LOCATION:  XRAY                         FACILITY:  [facility]  PHYSICIAN:  Loreta Aveaniel F. Dayle Mcnerney, M.D. DATE OF BIRTH:  Feb 06, 1976  DATE OF PROCEDURE:  09/25/2014 DATE OF DISCHARGE:                              OPERATIVE REPORT   PREOPERATIVE DIAGNOSIS:  Right knee medial meniscus tear.  POSTOPERATIVE DIAGNOSES:  Right knee medial meniscus tear.  Complex tearing of posterior medial meniscus.  Also, some focal grade 2 chondromalacia, peak of the patella and weightbearing dome, medial femoral condyle.  PROCEDURES:  Right knee exam under anesthesia, arthroscopy.  Partial medial meniscectomy.  Chondroplasty of patella and medial femoral condyle.  SURGEON:  Loreta Aveaniel F. Kerim Statzer, M.D.  ASSISTANT:  Odelia GageLindsey Anton, PA.  ANESTHESIA:  General.  BLOOD LOSS:  Minimal.  SPECIMENS:  None.  CULTURES:  None.  COMPLICATIONS:  None.  DRESSINGS:  Soft compressive.  TOURNIQUET:  Not employed.  DESCRIPTION OF PROCEDURE:  The patient was brought to the operating room, placed on the operating table in supine position.  After adequate anesthesia had been obtained, leg holder applied.  Leg was prepped and draped in usual sterile fashion.  Two portals; one each medial and lateral parapatellar.  Arthroscope introduced.  Knee distended and inspected.  Good patellar tracking.  Some focal grade 2 changes, peak of the patella.  Medial set debrided.  ACL ligaments intact.  Lateral meniscus and lateral compartment looked good.  Medial meniscus, extensive complex tearing, entire posterior third going into the middle third.  Saucerized out to a stable rim, tapered into remaining meniscus. Numerous displaced fragments.  Some grade 2 and 3 changes on the condyle juxtaposed to the tear, debrided to a stable surface.  Entire knee examined.  No other findings were appreciated.  Instruments and fluid were removed.  Knee  injected with Depo-Medrol and Marcaine. Portals were closed with nylon.  Sterile compressive dressing applied. Anesthesia reversed.  Brought to the recovery room.  Tolerated the surgery well.  No complications.     Loreta Aveaniel F. Pammie Chirino, M.D.     DFM/MEDQ  D:  09/25/2014  T:  09/26/2014  Job:  [id number]

## 2015-01-12 ENCOUNTER — Emergency Department (HOSPITAL_BASED_OUTPATIENT_CLINIC_OR_DEPARTMENT_OTHER)
Admission: EM | Admit: 2015-01-12 | Discharge: 2015-01-12 | Disposition: A | Discharge status: Home / Self Care | Payer: Medicaid Other | Attending: Emergency Medicine | Admitting: Emergency Medicine

## 2015-01-12 ENCOUNTER — Encounter (HOSPITAL_BASED_OUTPATIENT_CLINIC_OR_DEPARTMENT_OTHER): Payer: Self-pay

## 2015-01-12 DIAGNOSIS — Z8739 Personal history of other diseases of the musculoskeletal system and connective tissue: Secondary | ICD-10-CM | POA: Insufficient documentation

## 2015-01-12 DIAGNOSIS — E669 Obesity, unspecified: Secondary | ICD-10-CM | POA: Insufficient documentation

## 2015-01-12 DIAGNOSIS — K2970 Gastritis, unspecified, without bleeding: Secondary | ICD-10-CM

## 2015-01-12 DIAGNOSIS — I10 Essential (primary) hypertension: Secondary | ICD-10-CM | POA: Insufficient documentation

## 2015-01-12 DIAGNOSIS — R112 Nausea with vomiting, unspecified: Secondary | ICD-10-CM

## 2015-01-12 DIAGNOSIS — Z79899 Other long term (current) drug therapy: Secondary | ICD-10-CM | POA: Insufficient documentation

## 2015-01-12 DIAGNOSIS — J45909 Unspecified asthma, uncomplicated: Secondary | ICD-10-CM | POA: Insufficient documentation

## 2015-01-12 DIAGNOSIS — Z720 Tobacco use: Secondary | ICD-10-CM | POA: Insufficient documentation

## 2015-01-12 DIAGNOSIS — Z87820 Personal history of traumatic brain injury: Secondary | ICD-10-CM | POA: Insufficient documentation

## 2015-01-12 DIAGNOSIS — R1013 Epigastric pain: Secondary | ICD-10-CM

## 2015-01-12 DIAGNOSIS — F419 Anxiety disorder, unspecified: Secondary | ICD-10-CM | POA: Insufficient documentation

## 2015-01-12 DIAGNOSIS — K219 Gastro-esophageal reflux disease without esophagitis: Secondary | ICD-10-CM | POA: Insufficient documentation

## 2015-01-12 LAB — COMPREHENSIVE METABOLIC PANEL
ALBUMIN: 4.4 g/dL (ref 3.5–5.2)
ALT: 21 U/L (ref 0–53)
AST: 20 U/L (ref 0–37)
Alkaline Phosphatase: 78 U/L (ref 39–117)
Anion gap: 12 (ref 5–15)
BILIRUBIN TOTAL: 0.3 mg/dL (ref 0.3–1.2)
BUN: 10 mg/dL (ref 6–23)
CHLORIDE: 99 mmol/L (ref 96–112)
CO2: 28 mmol/L (ref 19–32)
Calcium: 9 mg/dL (ref 8.4–10.5)
Creatinine, Ser: 0.92 mg/dL (ref 0.50–1.35)
GFR calc Af Amer: >90 mL/min (ref >90)
GFR calc non Af Amer: >90 mL/min (ref >90)
GLUCOSE: 143 mg/dL — AB (ref 70–99)
POTASSIUM: 3.9 mmol/L (ref 3.5–5.1)
Sodium: 139 mmol/L (ref 135–145)
Total Protein: 7.8 g/dL (ref 6.0–8.3)

## 2015-01-12 LAB — CBC
HCT: 48.7 % (ref 39.0–52.0)
Hemoglobin: 16.3 g/dL (ref 13.0–17.0)
MCH: 30.1 pg (ref 26.0–34.0)
MCHC: 33.5 g/dL (ref 30.0–36.0)
MCV: 90 fL (ref 78.0–100.0)
Platelets: 309 10*3/uL (ref 150–400)
RBC: 5.41 MIL/uL (ref 4.22–5.81)
RDW: 12.8 % (ref 11.5–15.5)
WBC: 15.8 10*3/uL — ABNORMAL HIGH (ref 4.0–10.5)

## 2015-01-12 LAB — LIPASE, BLOOD: Lipase: 26 U/L (ref 11–59)

## 2015-01-12 MED ORDER — ONDANSETRON HCL 4 MG/2ML IJ SOLN
4.0000 mg | Freq: Once | INTRAMUSCULAR | Status: AC
  Administered 2015-01-12: 4 mg via INTRAVENOUS
  Filled 2015-01-12: qty 2

## 2015-01-12 MED ORDER — SODIUM CHLORIDE 0.9 % IV BOLUS (SEPSIS)
1000.0000 mL | Freq: Once | INTRAVENOUS | Status: AC
  Administered 2015-01-12: 1000 mL via INTRAVENOUS

## 2015-01-12 MED ORDER — PANTOPRAZOLE SODIUM 40 MG IV SOLR
40.0000 mg | Freq: Once | INTRAVENOUS | Status: AC
  Administered 2015-01-12: 40 mg via INTRAVENOUS
  Filled 2015-01-12: qty 40

## 2015-01-12 MED ORDER — PROMETHAZINE HCL 25 MG PO TABS: 25.0000 mg | ORAL_TABLET | Freq: Three times a day (TID) | ORAL | Status: DC | PRN

## 2015-01-12 MED ORDER — OXYCODONE HCL 5 MG PO TABS: 5.0000 mg | ORAL_TABLET | ORAL | Status: DC | PRN

## 2015-01-12 MED ORDER — METOCLOPRAMIDE HCL 5 MG/ML IJ SOLN
10.0000 mg | Freq: Once | INTRAMUSCULAR | Status: AC
  Administered 2015-01-12: 10 mg via INTRAVENOUS
  Filled 2015-01-12: qty 2

## 2015-01-12 MED ORDER — PROMETHAZINE HCL 25 MG/ML IJ SOLN
25.0000 mg | Freq: Once | INTRAMUSCULAR | Status: AC
  Administered 2015-01-12: 25 mg via INTRAVENOUS
  Filled 2015-01-12: qty 1

## 2015-01-12 MED ORDER — MORPHINE SULFATE 4 MG/ML IJ SOLN
4.0000 mg | Freq: Once | INTRAMUSCULAR | Status: AC
  Administered 2015-01-12: 4 mg via INTRAVENOUS
  Filled 2015-01-12: qty 1

## 2015-01-12 MED ORDER — SUCRALFATE 1 GM/10ML PO SUSP: 1.0000 g | Freq: Three times a day (TID) | ORAL | Status: DC

## 2015-01-12 NOTE — ED Notes (Signed)
C/o vomiting since 830am-states "gastritis"

## 2015-01-12 NOTE — ED Provider Notes (Signed)
Patient has not vomited over the last hour.  Patient is advised return here as needed.  Told to increase his fluid intake  Charlestine Nighthristopher Chung Chagoya, PA-C 01/12/15 1838  Rolan BuccoMelanie Belfi, MD 01/13/15 [phone]

## 2015-01-12 NOTE — Discharge Instructions (Signed)
Return here as needed.  Follow-up with your primary care doctor.  Slowly increase your fluid intake °

## 2015-01-12 NOTE — ED Provider Notes (Signed)
CSN: [id number]     Arrival date & time 01/12/15  1354 History   First MD Initiated Contact with Patient 01/12/15 1431     Chief Complaint  Patient presents with  . Emesis     (Consider location/radiation/quality/duration/timing/severity/associated sxs/prior Treatment) HPI Comments: 39 year old male presenting with a "gastritis flare" beginning at 8:30 AM today. Reports he had 3 shots of alcohol last night, drinks alcohol every other night, and this morning when he woke up, he started vomiting. Denies abdominal pain, states his abdomen is just sore from the 20 episodes of bilious, nonbloody emesis. No alleviating factors. Unable to keep anything down. Denies fever or chills. Denies chest pain. Last bowel movement was about 2 hours prior to arrival. Denies diarrhea.  Patient is a 39 y.o. male presenting with vomiting. The history is provided by the patient.  Emesis   Past Medical History  Diagnosis Date  . Gastritis   . Acid reflux   . Anxiety   . Obesity   . History of traumatic brain injury 2006  . Hypertension     states under control with med., has been on med. x 1 yr.  . Asthma     daily and prn inhalers  . Chondromalacia of right patella 09/2014  . Meniscal cyst 09/2014    right knee  . Smokers' cough    Past Surgical History  Procedure Laterality Date  . Appendectomy    . Tonsillectomy and adenoidectomy    . Irrigation and debridement abscess Left 02/27/2008    hand  . Tenosynovectomy Left 02/27/2008    flexor tenosynovectomy left hand  . Foreign body removal Left 02/27/2008    hand  . Incision and drainage abscess Left 03/01/2008    hand  . Knee arthroscopy with medial menisectomy Right 09/25/2014    Procedure: RIGHT KNEE SCOPE CHONDROPLASTY/MEDIAL MENISCECTOMY;  Surgeon: Loreta Aveaniel F Murphy, MD;  Location: [facility];  Service: Orthopedics;  Laterality: Right;  ANESTHESIA: GENERAL, KNEE BLOCK   No family history on file. History  Substance Use Topics  .  Smoking status: Current Every Day Smoker -- 1.50 packs/day for 26 years    Types: Cigarettes  . Smokeless tobacco: Never Used  . Alcohol Use: 0.0 oz/week    0 Standard drinks or equivalent per week     Comment: 2 x/week    Review of Systems  Gastrointestinal: Positive for nausea and vomiting.  All other systems reviewed and are negative.     Allergies  Acetaminophen and Shellfish allergy  Home Medications   Prior to Admission medications   Medication Sig Start Date End Date Taking? Authorizing Provider  albuterol (PROVENTIL HFA;VENTOLIN HFA) 108 (90 BASE) MCG/ACT inhaler Inhale into the lungs every 6 (six) hours as needed for wheezing or shortness of breath.    Historical Provider, MD  busPIRone (BUSPAR) 10 MG tablet Take 10 mg by mouth 2 (two) times daily.    Historical Provider, MD  cyclobenzaprine (FLEXERIL) 10 MG tablet Take 10 mg by mouth 3 (three) times daily as needed for muscle spasms.    Historical Provider, MD  losartan (COZAAR) 100 MG tablet Take 100 mg by mouth daily.    Historical Provider, MD  mometasone-formoterol (DULERA) 100-5 MCG/ACT AERO Inhale 2 puffs into the lungs 2 (two) times daily.    Historical Provider, MD  ranitidine (ZANTAC) 150 MG tablet Take 150 mg by mouth daily.     Historical Provider, MD  sildenafil (REVATIO) 20 MG tablet Take 20 mg by  mouth 3 (three) times daily.    Historical Provider, MD  traZODone (DESYREL) 50 MG tablet Take 50 mg by mouth at bedtime.    Historical Provider, MD   BP 161/85 mmHg  Pulse 98  Temp(Src) 97.6 F (36.4 C) (Oral)  Resp 20  Ht  (1.702 m)  Wt 277 lb (125.646 kg)  BMI 43.37 kg/m2  SpO2 100% Physical Exam  Constitutional: He is oriented to person, place, and time. He appears well-developed and well-nourished. No distress.  Obese.  HENT:  Head: Normocephalic and atraumatic.  Eyes: Conjunctivae and EOM are normal. No scleral icterus.  Neck: Normal range of motion. Neck supple.  Cardiovascular: Normal rate,  regular rhythm and normal heart sounds.   Pulmonary/Chest: Effort normal and breath sounds normal.  Abdominal: Soft. Bowel sounds are normal. He exhibits no distension. There is no tenderness. There is no rebound and no guarding.  Palpation of abdomen worsens nausea.  Musculoskeletal: Normal range of motion. He exhibits no edema.  Neurological: He is alert and oriented to person, place, and time.  Skin: Skin is warm and dry.  Psychiatric: He has a normal mood and affect. His behavior is normal.  Nursing note and vitals reviewed.   ED Course  Procedures (including critical care time) Labs Review Labs Reviewed  CBC - Abnormal; Notable for the following:    WBC 15.8 (*)    All other components within normal limits  COMPREHENSIVE METABOLIC PANEL - Abnormal; Notable for the following:    Glucose, Bld 143 (*)    All other components within normal limits  LIPASE, BLOOD    Imaging Review No results found.   EKG Interpretation None      MDM   Final diagnoses:  Non-intractable vomiting with nausea, vomiting of unspecified type  Gastritis  Epigastric abdominal pain   Nontoxic appearing, NAD. Afebrile. Hypertensive, vitals otherwise stable. Abdomen soft and nontender. Palpation of the abdomen worsens nausea. History of gastritis, this episode feels similar. Most likely given the fact he had alcohol yesterday evening and drinks frequently. Plan to give IV fluids, Zofran and Protonix and obtain labs.  3:30 PM Pt stating he has epigastric abdominal pain. Still nauseated. Phenergan for nausea.  4:15 PM Morphine for abdominal pain. Abdomen soft, no peritoneal signs. Epigastric tenderness.  4:22 PM Pt signed out to [employer], PA-C at shift change. Pt waiting to receive pain meds.  Kathrynn Speed, PA-C 01/12/15 1623  Eber Hong, MD 01/12/15 2032

## 2015-09-14 ENCOUNTER — Inpatient Hospital Stay (HOSPITAL_BASED_OUTPATIENT_CLINIC_OR_DEPARTMENT_OTHER)
Admission: EM | Admit: 2015-09-14 | Discharge: 2015-09-17 | DRG: 190 | Disposition: A | Discharge status: Home / Self Care | Payer: Medicaid Other | Attending: Internal Medicine | Admitting: Internal Medicine

## 2015-09-14 ENCOUNTER — Emergency Department (HOSPITAL_BASED_OUTPATIENT_CLINIC_OR_DEPARTMENT_OTHER): Payer: Medicaid Other

## 2015-09-14 ENCOUNTER — Encounter (HOSPITAL_BASED_OUTPATIENT_CLINIC_OR_DEPARTMENT_OTHER): Payer: Self-pay | Admitting: *Deleted

## 2015-09-14 DIAGNOSIS — Z6841 Body Mass Index (BMI) 40.0 and over, adult: Secondary | ICD-10-CM | POA: Diagnosis not present

## 2015-09-14 DIAGNOSIS — Z886 Allergy status to analgesic agent status: Secondary | ICD-10-CM | POA: Diagnosis not present

## 2015-09-14 DIAGNOSIS — R05 Cough: Secondary | ICD-10-CM | POA: Diagnosis present

## 2015-09-14 DIAGNOSIS — R059 Cough, unspecified: Secondary | ICD-10-CM | POA: Diagnosis present

## 2015-09-14 DIAGNOSIS — R7303 Prediabetes: Secondary | ICD-10-CM | POA: Diagnosis present

## 2015-09-14 DIAGNOSIS — Z91013 Allergy to seafood: Secondary | ICD-10-CM

## 2015-09-14 DIAGNOSIS — Z23 Encounter for immunization: Secondary | ICD-10-CM

## 2015-09-14 DIAGNOSIS — K219 Gastro-esophageal reflux disease without esophagitis: Secondary | ICD-10-CM | POA: Diagnosis present

## 2015-09-14 DIAGNOSIS — Z87820 Personal history of traumatic brain injury: Secondary | ICD-10-CM | POA: Diagnosis not present

## 2015-09-14 DIAGNOSIS — N179 Acute kidney failure, unspecified: Secondary | ICD-10-CM | POA: Diagnosis present

## 2015-09-14 DIAGNOSIS — J9601 Acute respiratory failure with hypoxia: Secondary | ICD-10-CM | POA: Diagnosis present

## 2015-09-14 DIAGNOSIS — F17210 Nicotine dependence, cigarettes, uncomplicated: Secondary | ICD-10-CM | POA: Diagnosis present

## 2015-09-14 DIAGNOSIS — I10 Essential (primary) hypertension: Secondary | ICD-10-CM | POA: Diagnosis present

## 2015-09-14 DIAGNOSIS — F419 Anxiety disorder, unspecified: Secondary | ICD-10-CM | POA: Diagnosis present

## 2015-09-14 DIAGNOSIS — R000 Tachycardia, unspecified: Secondary | ICD-10-CM | POA: Diagnosis present

## 2015-09-14 DIAGNOSIS — E872 Acidosis: Secondary | ICD-10-CM | POA: Diagnosis present

## 2015-09-14 DIAGNOSIS — J189 Pneumonia, unspecified organism: Secondary | ICD-10-CM | POA: Diagnosis present

## 2015-09-14 DIAGNOSIS — J441 Chronic obstructive pulmonary disease with (acute) exacerbation: Secondary | ICD-10-CM | POA: Diagnosis present

## 2015-09-14 DIAGNOSIS — J069 Acute upper respiratory infection, unspecified: Secondary | ICD-10-CM | POA: Diagnosis present

## 2015-09-14 DIAGNOSIS — R739 Hyperglycemia, unspecified: Secondary | ICD-10-CM | POA: Diagnosis present

## 2015-09-14 DIAGNOSIS — J45909 Unspecified asthma, uncomplicated: Secondary | ICD-10-CM | POA: Diagnosis present

## 2015-09-14 DIAGNOSIS — J440 Chronic obstructive pulmonary disease with acute lower respiratory infection: Secondary | ICD-10-CM | POA: Diagnosis present

## 2015-09-14 DIAGNOSIS — F17200 Nicotine dependence, unspecified, uncomplicated: Secondary | ICD-10-CM | POA: Diagnosis present

## 2015-09-14 DIAGNOSIS — E669 Obesity, unspecified: Secondary | ICD-10-CM | POA: Diagnosis present

## 2015-09-14 DIAGNOSIS — R079 Chest pain, unspecified: Secondary | ICD-10-CM | POA: Diagnosis present

## 2015-09-14 DIAGNOSIS — J449 Chronic obstructive pulmonary disease, unspecified: Secondary | ICD-10-CM | POA: Diagnosis present

## 2015-09-14 DIAGNOSIS — J9621 Acute and chronic respiratory failure with hypoxia: Secondary | ICD-10-CM | POA: Diagnosis present

## 2015-09-14 HISTORY — DX: Hyperglycemia, unspecified: R73.9

## 2015-09-14 HISTORY — DX: Chronic obstructive pulmonary disease, unspecified: J44.9

## 2015-09-14 HISTORY — DX: Acute respiratory failure, unspecified whether with hypoxia or hypercapnia: J96.00

## 2015-09-14 LAB — URINALYSIS, ROUTINE W REFLEX MICROSCOPIC
BILIRUBIN URINE: NEGATIVE
GLUCOSE, UA: NEGATIVE mg/dL
KETONES UR: NEGATIVE mg/dL
Leukocytes, UA: NEGATIVE
NITRITE: NEGATIVE
PH: 6 (ref 5.0–8.0)
Protein, ur: 100 mg/dL — AB
SPECIFIC GRAVITY, URINE: 1.021 (ref 1.005–1.030)

## 2015-09-14 LAB — URINE MICROSCOPIC-ADD ON

## 2015-09-14 LAB — BASIC METABOLIC PANEL
Anion gap: 10 (ref 5–15)
BUN: 5 mg/dL — ABNORMAL LOW (ref 6–20)
CALCIUM: 9.1 mg/dL (ref 8.9–10.3)
CO2: 32 mmol/L (ref 22–32)
CREATININE: 0.73 mg/dL (ref 0.61–1.24)
Chloride: 97 mmol/L — ABNORMAL LOW (ref 101–111)
GFR calc Af Amer: >60 mL/min (ref >60)
GFR calc non Af Amer: >60 mL/min (ref >60)
Glucose, Bld: 142 mg/dL — ABNORMAL HIGH (ref 65–99)
Potassium: 3.5 mmol/L (ref 3.5–5.1)
SODIUM: 139 mmol/L (ref 135–145)

## 2015-09-14 LAB — CBC WITH DIFFERENTIAL/PLATELET
Basophils Absolute: 0 10*3/uL (ref 0.0–0.1)
Basophils Relative: 0 %
EOS ABS: 0 10*3/uL (ref 0.0–0.7)
Eosinophils Relative: 0 %
HCT: 49.8 % (ref 39.0–52.0)
HEMOGLOBIN: 16.5 g/dL (ref 13.0–17.0)
LYMPHS ABS: 1.4 10*3/uL (ref 0.7–4.0)
Lymphocytes Relative: 12 %
MCH: 30.9 pg (ref 26.0–34.0)
MCHC: 33.1 g/dL (ref 30.0–36.0)
MCV: 93.3 fL (ref 78.0–100.0)
MONO ABS: 1 10*3/uL (ref 0.1–1.0)
MONOS PCT: 8 %
Neutro Abs: 9.9 10*3/uL — ABNORMAL HIGH (ref 1.7–7.7)
Neutrophils Relative %: 80 %
Platelets: 230 10*3/uL (ref 150–400)
RBC: 5.34 MIL/uL (ref 4.22–5.81)
RDW: 13.6 % (ref 11.5–15.5)
WBC: 12.3 10*3/uL — ABNORMAL HIGH (ref 4.0–10.5)

## 2015-09-14 LAB — I-STAT ARTERIAL BLOOD GAS, ED
ACID-BASE EXCESS: 6 mmol/L — AB (ref 0.0–2.0)
Bicarbonate: 34.3 mEq/L — ABNORMAL HIGH (ref 20.0–24.0)
O2 SAT: 92 %
PO2 ART: 69 mmHg — AB (ref 80.0–100.0)
Patient temperature: 98.9
TCO2: 36 mmol/L (ref 0–100)
pCO2 arterial: 61.9 mmHg (ref 35.0–45.0)
pH, Arterial: 7.353 (ref 7.350–7.450)

## 2015-09-14 LAB — RAPID URINE DRUG SCREEN, HOSP PERFORMED
AMPHETAMINES: NOT DETECTED
BENZODIAZEPINES: NOT DETECTED
Barbiturates: NOT DETECTED
Cocaine: NOT DETECTED
OPIATES: POSITIVE — AB
TETRAHYDROCANNABINOL: POSITIVE — AB

## 2015-09-14 LAB — TROPONIN I
TROPONIN I: 0.03 ng/mL (ref <0.031)
Troponin I: 0.03 ng/mL (ref <0.031)

## 2015-09-14 LAB — INFLUENZA PANEL BY PCR (TYPE A & B)
H1N1FLUPCR: NOT DETECTED
INFLBPCR: NEGATIVE
Influenza A By PCR: NEGATIVE

## 2015-09-14 LAB — GLUCOSE, CAPILLARY
GLUCOSE-CAPILLARY: 192 mg/dL — AB (ref 65–99)
Glucose-Capillary: 150 mg/dL — ABNORMAL HIGH (ref 65–99)

## 2015-09-14 LAB — MRSA PCR SCREENING: MRSA by PCR: NEGATIVE

## 2015-09-14 LAB — TSH: TSH: 0.977 u[IU]/mL (ref 0.350–4.500)

## 2015-09-14 MED ORDER — MORPHINE SULFATE (PF) 2 MG/ML IV SOLN
1.0000 mg | INTRAVENOUS | Status: DC | PRN
  Administered 2015-09-14 – 2015-09-15 (×6): 1 mg via INTRAVENOUS
  Administered 2015-09-16: 0.5 mg via INTRAVENOUS
  Administered 2015-09-16 – 2015-09-17 (×5): 1 mg via INTRAVENOUS
  Filled 2015-09-14 (×11): qty 1

## 2015-09-14 MED ORDER — ALBUTEROL (5 MG/ML) CONTINUOUS INHALATION SOLN
INHALATION_SOLUTION | RESPIRATORY_TRACT | Status: AC
  Filled 2015-09-14: qty 20

## 2015-09-14 MED ORDER — GUAIFENESIN-DM 100-10 MG/5ML PO SYRP
5.0000 mL | ORAL_SOLUTION | ORAL | Status: DC | PRN
  Administered 2015-09-14 – 2015-09-17 (×5): 5 mL via ORAL
  Filled 2015-09-14 (×5): qty 5

## 2015-09-14 MED ORDER — SODIUM CHLORIDE 0.9 % IJ SOLN
3.0000 mL | Freq: Two times a day (BID) | INTRAMUSCULAR | Status: DC
  Administered 2015-09-14 – 2015-09-17 (×6): 3 mL via INTRAVENOUS

## 2015-09-14 MED ORDER — ONDANSETRON HCL 4 MG PO TABS
4.0000 mg | ORAL_TABLET | Freq: Four times a day (QID) | ORAL | Status: DC | PRN
Start: 2015-09-14 — End: 2015-09-17

## 2015-09-14 MED ORDER — MAGNESIUM SULFATE 50 % IJ SOLN
1.0000 g | Freq: Once | INTRAMUSCULAR | Status: AC
  Administered 2015-09-14: 1 g via INTRAVENOUS
  Filled 2015-09-14: qty 2

## 2015-09-14 MED ORDER — INSULIN ASPART 100 UNIT/ML ~~LOC~~ SOLN
0.0000 [IU] | Freq: Three times a day (TID) | SUBCUTANEOUS | Status: DC
  Administered 2015-09-14 – 2015-09-15 (×3): 3 [IU] via SUBCUTANEOUS
  Administered 2015-09-15: 2 [IU] via SUBCUTANEOUS
  Administered 2015-09-16 (×3): 3 [IU] via SUBCUTANEOUS
  Administered 2015-09-17: 8 [IU] via SUBCUTANEOUS

## 2015-09-14 MED ORDER — LEVOFLOXACIN IN D5W 500 MG/100ML IV SOLN
500.0000 mg | Freq: Once | INTRAVENOUS | Status: AC
  Administered 2015-09-14: 500 mg via INTRAVENOUS
  Filled 2015-09-14: qty 100

## 2015-09-14 MED ORDER — ALBUTEROL (5 MG/ML) CONTINUOUS INHALATION SOLN
15.0000 mg/h | INHALATION_SOLUTION | RESPIRATORY_TRACT | Status: DC
  Administered 2015-09-14: 15 mg/h via RESPIRATORY_TRACT

## 2015-09-14 MED ORDER — FAMOTIDINE 20 MG PO TABS
10.0000 mg | ORAL_TABLET | Freq: Two times a day (BID) | ORAL | Status: DC
Start: 2015-09-14 — End: 2015-09-17
  Administered 2015-09-14 – 2015-09-17 (×6): 10 mg via ORAL
  Filled 2015-09-14 (×6): qty 1

## 2015-09-14 MED ORDER — SUCRALFATE 1 GM/10ML PO SUSP
1.0000 g | Freq: Three times a day (TID) | ORAL | Status: DC
  Administered 2015-09-14 – 2015-09-17 (×11): 1 g via ORAL
  Filled 2015-09-14 (×10): qty 10

## 2015-09-14 MED ORDER — OXYCODONE HCL 5 MG PO TABS
5.0000 mg | ORAL_TABLET | ORAL | Status: DC | PRN
  Administered 2015-09-14 – 2015-09-17 (×9): 5 mg via ORAL
  Filled 2015-09-14 (×10): qty 1

## 2015-09-14 MED ORDER — LEVOFLOXACIN IN D5W 750 MG/150ML IV SOLN: 750.0000 mg | INTRAVENOUS | Status: DC

## 2015-09-14 MED ORDER — LEVOFLOXACIN IN D5W 750 MG/150ML IV SOLN
750.0000 mg | INTRAVENOUS | Status: DC
  Administered 2015-09-15: 750 mg via INTRAVENOUS
  Filled 2015-09-14: qty 150

## 2015-09-14 MED ORDER — INSULIN ASPART 100 UNIT/ML ~~LOC~~ SOLN
0.0000 [IU] | Freq: Every day | SUBCUTANEOUS | Status: DC
Start: 2015-09-14 — End: 2015-09-17

## 2015-09-14 MED ORDER — CHLORHEXIDINE GLUCONATE 0.12 % MT SOLN
15.0000 mL | Freq: Two times a day (BID) | OROMUCOSAL | Status: DC
  Administered 2015-09-14 – 2015-09-16 (×5): 15 mL via OROMUCOSAL
  Filled 2015-09-14 (×6): qty 15

## 2015-09-14 MED ORDER — HYDRALAZINE HCL 20 MG/ML IJ SOLN: 5.0000 mg | INTRAMUSCULAR | Status: DC | PRN

## 2015-09-14 MED ORDER — METHYLPREDNISOLONE SODIUM SUCC 125 MG IJ SOLR
125.0000 mg | Freq: Once | INTRAMUSCULAR | Status: AC
  Administered 2015-09-14: 125 mg via INTRAVENOUS
  Filled 2015-09-14: qty 2

## 2015-09-14 MED ORDER — SENNOSIDES-DOCUSATE SODIUM 8.6-50 MG PO TABS: 1.0000 | ORAL_TABLET | Freq: Every evening | ORAL | Status: DC | PRN

## 2015-09-14 MED ORDER — ALBUTEROL SULFATE (2.5 MG/3ML) 0.083% IN NEBU
2.5000 mg | INHALATION_SOLUTION | RESPIRATORY_TRACT | Status: DC
  Administered 2015-09-14 – 2015-09-16 (×14): 2.5 mg via RESPIRATORY_TRACT
  Filled 2015-09-14 (×14): qty 3

## 2015-09-14 MED ORDER — SODIUM CHLORIDE 0.9 % IV SOLN
INTRAVENOUS | Status: AC
  Administered 2015-09-14: 12:00:00 via INTRAVENOUS

## 2015-09-14 MED ORDER — TRAZODONE HCL 50 MG PO TABS
25.0000 mg | ORAL_TABLET | Freq: Every evening | ORAL | Status: DC | PRN
  Administered 2015-09-15 – 2015-09-16 (×2): 25 mg via ORAL
  Filled 2015-09-14 (×2): qty 1

## 2015-09-14 MED ORDER — MORPHINE SULFATE (PF) 4 MG/ML IV SOLN
4.0000 mg | Freq: Once | INTRAVENOUS | Status: AC
  Administered 2015-09-14: 4 mg via INTRAVENOUS
  Filled 2015-09-14: qty 1

## 2015-09-14 MED ORDER — ALBUTEROL SULFATE (2.5 MG/3ML) 0.083% IN NEBU: 2.5000 mg | INHALATION_SOLUTION | RESPIRATORY_TRACT | Status: DC | PRN

## 2015-09-14 MED ORDER — METHYLPREDNISOLONE SODIUM SUCC 125 MG IJ SOLR
60.0000 mg | Freq: Four times a day (QID) | INTRAMUSCULAR | Status: DC
  Administered 2015-09-14 – 2015-09-15 (×5): 60 mg via INTRAVENOUS
  Filled 2015-09-14 (×5): qty 2

## 2015-09-14 MED ORDER — ENOXAPARIN SODIUM 40 MG/0.4ML ~~LOC~~ SOLN
40.0000 mg | SUBCUTANEOUS | Status: DC
  Administered 2015-09-14 – 2015-09-16 (×3): 40 mg via SUBCUTANEOUS
  Filled 2015-09-14 (×3): qty 0.4

## 2015-09-14 MED ORDER — LORAZEPAM 1 MG PO TABS
1.0000 mg | ORAL_TABLET | Freq: Once | ORAL | Status: AC
  Administered 2015-09-14: 1 mg via ORAL
  Filled 2015-09-14: qty 1

## 2015-09-14 MED ORDER — NICOTINE 21 MG/24HR TD PT24
21.0000 mg | MEDICATED_PATCH | Freq: Once | TRANSDERMAL | Status: AC
  Administered 2015-09-14: 21 mg via TRANSDERMAL
  Filled 2015-09-14 (×2): qty 1

## 2015-09-14 MED ORDER — ONDANSETRON HCL 4 MG/2ML IJ SOLN: 4.0000 mg | Freq: Four times a day (QID) | INTRAMUSCULAR | Status: DC | PRN

## 2015-09-14 MED ORDER — ALBUTEROL SULFATE (2.5 MG/3ML) 0.083% IN NEBU: 5.0000 mg | INHALATION_SOLUTION | Freq: Once | RESPIRATORY_TRACT | Status: DC

## 2015-09-14 MED ORDER — IPRATROPIUM BROMIDE 0.02 % IN SOLN
RESPIRATORY_TRACT | Status: AC
  Filled 2015-09-14: qty 2.5

## 2015-09-14 MED ORDER — CETYLPYRIDINIUM CHLORIDE 0.05 % MT LIQD
7.0000 mL | Freq: Two times a day (BID) | OROMUCOSAL | Status: DC
  Administered 2015-09-15 – 2015-09-16 (×4): 7 mL via OROMUCOSAL

## 2015-09-14 MED ORDER — IPRATROPIUM BROMIDE 0.02 % IN SOLN
0.5000 mg | Freq: Once | RESPIRATORY_TRACT | Status: AC
  Administered 2015-09-14: 0.5 mg via RESPIRATORY_TRACT

## 2015-09-14 MED ORDER — LOSARTAN POTASSIUM 50 MG PO TABS
100.0000 mg | ORAL_TABLET | Freq: Every day | ORAL | Status: DC
  Administered 2015-09-14 – 2015-09-15 (×2): 100 mg via ORAL
  Filled 2015-09-14 (×2): qty 2

## 2015-09-14 NOTE — ED Notes (Signed)
R Rad Artery x1 attempt for ABG unsuccessful. L Rad Artery x 1 attempt. Sample obtained. No complications noted. Bleeding stopped. Sample taken to Lab. Results pending.

## 2015-09-14 NOTE — Progress Notes (Signed)
[facility] for probable COPD exacerbation. Patient with 3 days of worsening shortness of breath, not responding to home inhalers. Received steroids, nebs, comfortable on BiPAP. Influenza pending, will receive Levaquin.   RN, on patient arrival in stepdown please call [phone] or [phone] and let patient placement RN know of the patient's arrival, and a hospitalist will be assigned to admit the patient.   Costin M. Elvera LennoxGherghe, MD [facility] [phone]

## 2015-09-14 NOTE — ED Notes (Signed)
[facility]

## 2015-09-14 NOTE — Progress Notes (Signed)
[facility] alert/oriented on Bipap at 60 % Fio2

## 2015-09-14 NOTE — Progress Notes (Signed)
[facility] will [facility] admitting DR.

## 2015-09-14 NOTE — ED Provider Notes (Addendum)
CSN: [id number]     Arrival date & time 09/14/15  1005 History   First MD Initiated Contact with Patient 09/14/15 1017     Chief Complaint  Patient presents with  . Shortness of Breath     (Consider location/radiation/quality/duration/timing/severity/associated sxs/prior Treatment) Patient is a 39 y.o. male presenting with shortness of breath. The history is provided by the patient.  Shortness of Breath Severity:  Severe Onset quality:  Gradual Duration:  4 days Timing:  Constant Progression:  Worsening Chronicity:  Recurrent Context: weather changes   Relieved by:  Nothing Worsened by:  Activity and coughing (lying down) Ineffective treatments:  Inhaler Associated symptoms: cough and wheezing   Associated symptoms: no abdominal pain, no chest pain, no fever, no sputum production and no vomiting   Associated symptoms comment:  Generalized body aches Risk factors: tobacco use   Risk factors: no hx of PE/DVT, no prolonged immobilization and no recent surgery     Past Medical History  Diagnosis Date  . Gastritis   . Acid reflux   . Anxiety   . Obesity   . History of traumatic brain injury 2006  . Hypertension     states under control with med., has been on med. x 1 yr.  . Asthma     daily and prn inhalers  . Chondromalacia of right patella 09/2014  . Meniscal cyst 09/2014    right knee  . Smokers' cough (HCC)   . COPD (chronic obstructive pulmonary disease) [facility])    Past Surgical History  Procedure Laterality Date  . Appendectomy    . Tonsillectomy and adenoidectomy    . Irrigation and debridement abscess Left 02/27/2008    hand  . Tenosynovectomy Left 02/27/2008    flexor tenosynovectomy left hand  . Foreign body removal Left 02/27/2008    hand  . Incision and drainage abscess Left 03/01/2008    hand  . Knee arthroscopy with medial menisectomy Right 09/25/2014    Procedure: RIGHT KNEE SCOPE CHONDROPLASTY/MEDIAL MENISCECTOMY;  Surgeon: Loreta Ave, MD;   Location: [facility];  Service: Orthopedics;  Laterality: Right;  ANESTHESIA: GENERAL, KNEE BLOCK   No family history on file. Social History  Substance Use Topics  . Smoking status: Current Every Day Smoker -- 1.50 packs/day for 26 years    Types: Cigarettes  . Smokeless tobacco: Never Used  . Alcohol Use: 0.0 oz/week    0 Standard drinks or equivalent per week     Comment: 2 x/week    Review of Systems  Constitutional: Negative for fever.  Respiratory: Positive for cough, shortness of breath and wheezing. Negative for sputum production.   Cardiovascular: Negative for chest pain.  Gastrointestinal: Negative for vomiting and abdominal pain.  All other systems reviewed and are negative.     Allergies  Acetaminophen and Shellfish allergy  Home Medications   Prior to Admission medications   Medication Sig Start Date End Date Taking? Authorizing Provider  albuterol (PROVENTIL HFA;VENTOLIN HFA) 108 (90 BASE) MCG/ACT inhaler Inhale into the lungs every 6 (six) hours as needed for wheezing or shortness of breath.   Yes Historical Provider, MD  cyclobenzaprine (FLEXERIL) 10 MG tablet Take 10 mg by mouth 3 (three) times daily as needed for muscle spasms.   Yes Historical Provider, MD  losartan (COZAAR) 100 MG tablet Take 100 mg by mouth daily.   Yes Historical Provider, MD  mometasone-formoterol (DULERA) 100-5 MCG/ACT AERO Inhale 2 puffs into the lungs 2 (two) times daily.  Yes Historical Provider, MD  ranitidine (ZANTAC) 150 MG tablet Take 150 mg by mouth daily.    Yes Historical Provider, MD  busPIRone (BUSPAR) 10 MG tablet Take 10 mg by mouth 2 (two) times daily.    Historical Provider, MD  oxyCODONE (ROXICODONE) 5 MG immediate release tablet Take 1 tablet (5 mg total) by mouth every 4 (four) hours as needed for severe pain. 01/12/15   Charlestine Nighthristopher Lawyer, PA-C  promethazine (PHENERGAN) 25 MG tablet Take 1 tablet (25 mg total) by mouth every 8 (eight) hours as needed for  nausea or vomiting. 01/12/15   Charlestine Nighthristopher Lawyer, PA-C  sildenafil (REVATIO) 20 MG tablet Take 20 mg by mouth 3 (three) times daily.    Historical Provider, MD  sucralfate (CARAFATE) 1 GM/10ML suspension Take 10 mLs (1 g total) by mouth 4 (four) times daily -  with meals and at bedtime. 01/12/15   Charlestine Nighthristopher Lawyer, PA-C  traZODone (DESYREL) 50 MG tablet Take 50 mg by mouth at bedtime.    Historical Provider, MD   BP 135/97 mmHg  Pulse 120  Temp(Src) 98.9 F (37.2 C) (Oral)  Resp 26  Ht 5\' 7"  (1.702 m)  Wt 285 lb (129.275 kg)  BMI 44.63 kg/m2  SpO2 72% Physical Exam  Constitutional: He is oriented to person, place, and time. He appears well-developed and well-nourished. No distress.  HENT:  Head: Normocephalic and atraumatic.  Mouth/Throat: Oropharynx is clear and moist. Mucous membranes are dry.  Eyes: Conjunctivae and EOM are normal. Pupils are equal, round, and reactive to light.  Neck: Normal range of motion. Neck supple.  Cardiovascular: Regular rhythm and intact distal pulses.  Tachycardia present.   No murmur heard. Pulmonary/Chest: Effort normal. Tachypnea noted. No respiratory distress. He has decreased breath sounds. He has wheezes. He has no rales.  Abdominal: Soft. He exhibits no distension. There is no tenderness. There is no rebound and no guarding.  Musculoskeletal: Normal range of motion. He exhibits no edema or tenderness.  Neurological: He is alert and oriented to person, place, and time.  Skin: Skin is warm and dry. No rash noted. No erythema.  Psychiatric: He has a normal mood and affect. His behavior is normal.  Nursing note and vitals reviewed.   ED Course  Procedures (including critical care time) Labs Review Labs Reviewed  CBC WITH DIFFERENTIAL/PLATELET - Abnormal; Notable for the following:    WBC 12.3 (*)    Neutro Abs 9.9 (*)    All other components within normal limits  BASIC METABOLIC PANEL - Abnormal; Notable for the following:    Chloride 97 (*)     Glucose, Bld 142 (*)    BUN 5 (*)    All other components within normal limits  I-STAT ARTERIAL BLOOD GAS, ED - Abnormal; Notable for the following:    pCO2 arterial 61.9 (*)    pO2, Arterial 69.0 (*)    Bicarbonate 34.3 (*)    Acid-Base Excess 6.0 (*)    All other components within normal limits  INFLUENZA PANEL BY PCR (TYPE A & B, H1N1)    Imaging Review Dg Chest Port 1 View  09/14/2015  CLINICAL DATA:  39 year old male with extreme shortness of breath. COPD, smoker, asthma. Initial encounter. EXAM: PORTABLE CHEST 1 VIEW COMPARISON:  08/27/2014 and earlier. FINDINGS: Portable AP semi upright view at 1037 hours. Stable lung volumes. Stable cardiac size at the upper limits of normal. Other mediastinal contours are within normal limits. Visualized tracheal air column is within normal limits.  No pneumothorax, pleural effusion or consolidation. No definite pulmonary edema, increased interstitial markings are stable to mildly progressed. IMPRESSION: 1. Stable to mildly grade progressed pulmonary interstitial markings. Consider viral/atypical respiratory infection. 2. No other acute cardiopulmonary abnormality. Electronically Signed   By: Odessa Fleming M.D.   On: 09/14/2015 10:51   I have personally reviewed and evaluated these images and lab results as part of my medical decision-making.   EKG Interpretation   Date/Time:  Monday September 14 2015 10:35:24 EST Ventricular Rate:  113 PR Interval:  124 QRS Duration: 86 QT Interval:  326 QTC Calculation: 447 R Axis:   84 Text Interpretation:  Sinus tachycardia Nonspecific T wave abnormality No  significant change since last tracing Confirmed by Anitra Lauth  MD, Alphonzo Lemmings  [phone]) on 09/14/2015 10:40:28 AM      MDM   Final diagnoses:  COPD exacerbation (HCC)  URI (upper respiratory infection)    Patient with a history of COPD, asthma, ongoing tobacco abuse presents today with 4 days of worsening shortness of breath, cough and nasal congestion.  Upon arrival here patient was satting 72% on room air with tachypnea and decreased air movement. He was placed on continuous albuterol and Atrovent nebs as well as BiPAP. Prior to BiPAP sats improved to 88% patient talking in short sentences.  Patient denies fever, productive cough or cold-like symptoms. No symptoms concerning for PE at this time and no prior cardiac history.  Patient given IV Solu-Medrol, magnesium, albuterol and Atrovent and an hour-long neb and BiPAP. Chest x-ray, CBC, BMP and ABG pending  11:12 AM Blood gas showing compensated respiratory acidosis with a CO2 of 61 and a bicarbonate of 34 with a normal pH. Mild cytosis of 12,000 and normal BMP. Chest x-ray without acute findings other day and interstitial markings concerning for a respiratory infection.  Patient did not receive a flu shot this year so also possible this is influenza.  Patient will be admitted for further care. Improvement with BiPAP and hour-long neb but still wheezing  CRITICAL CARE Performed by: Gwyneth Sprout Total critical care time: 30 minutes Critical care time was exclusive of separately billable procedures and treating other patients. Critical care was necessary to treat or prevent imminent or life-threatening deterioration. Critical care was time spent personally by me on the following activities: development of treatment plan with patient and/or surrogate as well as nursing, discussions with consultants, evaluation of patient's response to treatment, examination of patient, obtaining history from patient or surrogate, ordering and performing treatments and interventions, ordering and review of laboratory studies, ordering and review of radiographic studies, pulse oximetry and re-evaluation of patient's condition.   Gwyneth Sprout, MD 09/14/15 1148  Gwyneth Sprout, MD 09/14/15 [phone]

## 2015-09-14 NOTE — H&P (Signed)
[facility] History and Physical  Mendel Binsfeld ZOX:[id number] DOB: [birth year] DOA: 09/14/2015  Referring physician: Anitra Lauth from [facility] PCP: Delbert Harness, MD   Chief Complaint: worsening sob/cough  HPI: Steven Dudley is a very pleasant 39 y.o. male with a past medical history that includes COPD, tobacco use, hypertension, obesity, since to room to see 08 from appointment Center with chief complaint of persistent worsening shortness of breath. Initial evaluation at [facility] reveals acute respiratory failure with hypoxia likely related to COPD exacerbation, tachycardia mild leukocytosis and tachypnea.   Patient reports a four-day history of gradual worsening shortness of breath. He states it started with some nasal congestion and then worsened in his lungs. Associated symptoms include headache nausea without vomiting persistent nonproductive cough. He denies chest pain palpitations diaphoresis headache dizziness syncope or near-syncope. He denies any abdominal pain dysuria hematuria frequency or urgency. He denies constipation melena or bright red blood per rectum.  Workup includes basic metabolic panel significant for a chloride of 97 serum glucose 142, complete blood count unremarkable except for WBCs 12.3, arterial blood gas with a pH 7.35, PCO2 61.9, PO2 69.0 bicarbonate 43.4, EKG with sinus tach nonspecific T-wave abnormality. Chest x-ray reveals stable to mildly grade progressed pulmonary interstitial markings. Consider viral/atypical respiratory infection. No other acute cardiopulmonary abnormality. Influenza panel pending  In the emergency department he is provided with 125 mg of Solu-Medrol, continuous albuterol and Atrovent nebs as well as BiPAP his oxygen saturation level improved from 72% on room air to 88%.  Upon presentation he has a temperature of 99.7 a blood pressure 178/102 heart rate of 107 oxygen saturation levels 96% on BiPAP. He is also given Levaquin  and gentle IV fluids.  Review of Systems:  10 point review of systems complete and all systems are negative except as indicated in the history of present illness Past Medical History  Diagnosis Date  . Gastritis   . Acid reflux   . Anxiety   . Obesity   . History of traumatic brain injury 2006  . Hypertension     states under control with med., has been on med. x 1 yr.  . Asthma     daily and prn inhalers  . Chondromalacia of right patella 09/2014  . Meniscal cyst 09/2014    right knee  . Smokers' cough (HCC)   . COPD (chronic obstructive pulmonary disease) (HCC)   . Acute respiratory failure [facility])    Past Surgical History  Procedure Laterality Date  . Appendectomy    . Tonsillectomy and adenoidectomy    . Irrigation and debridement abscess Left 02/27/2008    hand  . Tenosynovectomy Left 02/27/2008    flexor tenosynovectomy left hand  . Foreign body removal Left 02/27/2008    hand  . Incision and drainage abscess Left 03/01/2008    hand  . Knee arthroscopy with medial menisectomy Right 09/25/2014    Procedure: RIGHT KNEE SCOPE CHONDROPLASTY/MEDIAL MENISCECTOMY;  Surgeon: Loreta Ave, MD;  Location: [facility];  Service: Orthopedics;  Laterality: Right;  ANESTHESIA: GENERAL, KNEE BLOCK   Social History:  reports that he has been smoking Cigarettes.  He has a 39 pack-year smoking history. He has never used smokeless tobacco. He reports that he drinks alcohol. He reports that he does not use illicit drugs. 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 He  continues to smoke Allergies  Allergen Reactions  . Acetaminophen Other (See Comments)    GI BLEEDING  . Shellfish Allergy Nausea And Vomiting, Nausea Only and Other (See Comments)    Headache, heat flashes.    No family history on file. both parents deceased mother with a history of hypertension diabetes,  heart disease died complications of an unknown cancer. Father deceased in motor vehicle accident he has no siblings  Prior to Admission medications   Medication Sig Start Date End Date Taking? Authorizing Provider  albuterol (PROVENTIL HFA;VENTOLIN HFA) 108 (90 BASE) MCG/ACT inhaler Inhale into the lungs every 6 (six) hours as needed for wheezing or shortness of breath.   Yes Historical Provider, MD  cyclobenzaprine (FLEXERIL) 10 MG tablet Take 10 mg by mouth 3 (three) times daily as needed for muscle spasms.   Yes Historical Provider, MD  losartan (COZAAR) 100 MG tablet Take 100 mg by mouth daily.   Yes Historical Provider, MD  mometasone-formoterol (DULERA) 100-5 MCG/ACT AERO Inhale 2 puffs into the lungs 2 (two) times daily.   Yes Historical Provider, MD  Phenyleph-Doxylamine-DM-APAP (TYLENOL COLD MULTI-SYMPTOM) 5-6.25-10-325 MG/15ML LIQD Take 30 mLs by mouth daily as needed (for cold).   Yes Historical Provider, MD  Pseudoeph-Doxylamine-DM-APAP (DAYQUIL/NYQUIL COLD/FLU RELIEF PO) Take 2 capsules by mouth daily as needed (for cold).   Yes Historical Provider, MD  ranitidine (ZANTAC) 150 MG tablet Take 150 mg by mouth daily.    Yes Historical Provider, MD  oxyCODONE (ROXICODONE) 5 MG immediate release tablet Take 1 tablet (5 mg total) by mouth every 4 (four) hours as needed for severe pain. 01/12/15   Charlestine Night, PA-C  promethazine (PHENERGAN) 25 MG tablet Take 1 tablet (25 mg total) by mouth every 8 (eight) hours as needed for nausea or vomiting. 01/12/15   Charlestine Night, PA-C  sucralfate (CARAFATE) 1 GM/10ML suspension Take 10 mLs (1 g total) by mouth 4 (four) times daily -  with meals and at bedtime. 01/12/15   Charlestine Night, PA-C   Physical Exam: Filed Vitals:   09/14/15 1230 09/14/15 1300 09/14/15 1317 09/14/15 1425  BP: 146/88 149/80 149/80 178/102  Pulse: 104 106 115 107  Temp:    99.7 F (37.6 C)  TempSrc:    Axillary  Resp: Height:     (1.702 m)    Weight:    119.8 kg (264 lb 1.8 oz)  SpO2: 87% 90% 95% 96%    Wt Readings from Last 3 Encounters:  09/14/15 119.8 kg (264 lb 1.8 oz)  01/12/15 125.646 kg (277 lb)  09/25/14 126.554 kg (279 lb)    General:  Appears calm and only slightly uncomfortable Eyes: PERRL, normal lids, irises & conjunctiva ENT: grossly normal hearing, lips & tongue he does membranes of his mouth are moist and pink Neck: no LAD, masses or thyromegaly Cardiovascular: Tachycardic but regular, no m/r/g. No LE edema.  Respiratory: Mild increased work of breathing at rest. Breath sounds are quite diminished bilateral bases with gradual improvement mid lobes and upper lobes faint end expiratory wheeze high hear no crackles. Abdomen: Obese soft positive bowel sounds nontender to palpation Skin: no rash or induration seen on limited exam Musculoskeletal: grossly normal tone BUE/BLE Psychiatric: grossly normal mood and affect, speech fluent and appropriate Neurologic: grossly non-focal. each screw facial symmetry           Labs on Admission:  Basic Metabolic Panel:  Recent Labs Lab 09/14/15 1025  NA 139  K 3.5  CL 97*  CO2 32  GLUCOSE 142*  BUN 5*  CREATININE 0.73  CALCIUM 9.1   Liver Function Tests: No results for input(s): AST, ALT, ALKPHOS, BILITOT, PROT, ALBUMIN in the last 168 hours. No results for input(s): LIPASE, AMYLASE in the last 168 hours. No results for input(s): AMMONIA in the last 168 hours. CBC:  Recent Labs Lab 09/14/15 1025  WBC 12.3*  NEUTROABS 9.9*  HGB 16.5  HCT 49.8  MCV 93.3  PLT 230   Cardiac Enzymes: No results for input(s): CKTOTAL, CKMB, CKMBINDEX, TROPONINI in the last 168 hours.  BNP (last 3 results) No results for input(s): BNP in the last 8760 hours.  ProBNP (last 3 results) No results for input(s): PROBNP in the last 8760 hours.   CREATININE: 0.73 (09/14/15 1025) Estimated creatinine clearance - 155.1 mL/min  CBG: No results for input(s): GLUCAP in  the last 168 hours.  Radiological Exams on Admission: Dg Chest Port 1 View  09/14/2015  CLINICAL DATA:  39 year old male with extreme shortness of breath. COPD, smoker, asthma. Initial encounter. EXAM: PORTABLE CHEST 1 VIEW COMPARISON:  08/27/2014 and earlier. FINDINGS: Portable AP semi upright view at 1037 hours. Stable lung volumes. Stable cardiac size at the upper limits of normal. Other mediastinal contours are within normal limits. Visualized tracheal air column is within normal limits. No pneumothorax, pleural effusion or consolidation. No definite pulmonary edema, increased interstitial markings are stable to mildly progressed. IMPRESSION: 1. Stable to mildly grade progressed pulmonary interstitial markings. Consider viral/atypical respiratory infection. 2. No other acute cardiopulmonary abnormality. Electronically Signed   By: Odessa FlemingH  Hall M.D.   On: 09/14/2015 10:51    EKG: Independently reviewed as noted above  Assessment/Plan Principal Problem:   Acute respiratory failure with hypoxia (HCC) Active Problems:   URI (upper respiratory infection)   Acid reflux   Obesity   Hypertension   COPD (chronic obstructive pulmonary disease) (HCC)   Chest pain   Cough   COPD exacerbation (HCC)   Tobacco use disorder   Tachycardia   Hyperglycemia   #1. Acute respiratory failure with hypoxia. Likely related to COPD exacerbation in the setting of possible upper respiratory infection and obesity -Admit to step down and continue BiPAP -Continue steroids nebulizers antibiotics -Monitor oxygen saturation level closely -Wean as able -Patient is not on oxygen at home -Home medications include Dulera twice a day  #2. COPD exacerbation possibly triggered by upper respiratory infection -Chest x-ray concerning for infection -Influenza panel pending -Leukocytosis mild, low-grade -nebulizer (schedulede) and steroids as noted above -flutter valve -Levaquin per pharmacy -[facility]  [facility]uer -Has had pulmonary function tests in the past  #3. Upper respiratory infection. Chest x-ray concerning for same -Influenza panel pending -Mild leukocytosis -Low-grade temperature -Levaquin as noted above -Antitussive  #4. Chest pain. Most likely pleuritic. Reproducible worse with cough -We'll cycle troponins for completeness -Antitussive as noted above -Monitor on telemetry -Repeat EKG in the a.m.  #5. Hypertension. Poor control on admission. -Home medications include Cozaar -Provide when necessary hydralazine  #6. Tachycardia -Likely related to above -Mild. -Monitor on telemetry -Check a TSH  #7. Hyperglycemia. Serum glucose 142 drawn before steroids. Denies outpatient steroids -We'll obtain a hemoglobin A1c -We'll provide sliding scale insulin for optimal control  #8. Obesity. BMI 44.7 -Likely contributing to #1. -Nutritional consult   #9. Tobacco use. Continues to smoke -Nicotine patch -Cessation counseling offered    Code Status: full DVT Prophylaxis: Family Communication: wife at bedside  Disposition Plan: home when ready  Time spent: 65 minutes  [facility] M [facility]

## 2015-09-14 NOTE — Progress Notes (Addendum)
ANTIBIOTIC CONSULT NOTE - INITIAL  Pharmacy Consult for Levaquin Indication: COPD exacerbation  Allergies  Allergen Reactions  . Acetaminophen Other (See Comments)    GI BLEEDING  . Shellfish Allergy Nausea And Vomiting, Nausea Only and Other (See Comments)    Headache, heat flashes.    Patient Measurements: Height: 5\' 7"  (170.2 cm) Weight: 264 lb 1.8 oz (119.8 kg) IBW/kg (Calculated) : 66.1  Vital Signs: Temp: 99.7 F (37.6 C) (12/05 1425) Temp Source: Axillary (12/05 1425) BP: 178/102 mmHg (12/05 1425) Pulse Rate: 107 (12/05 1425) Intake/Output from previous day:   Intake/Output from this shift:    Labs:  Recent Labs  09/14/15 1025  WBC 12.3*  HGB 16.5  PLT 230  CREATININE 0.73   Estimated Creatinine Clearance: 155.1 mL/min (by C-G formula based on Cr of 0.73). No results for input(s): VANCOTROUGH, VANCOPEAK, VANCORANDOM, GENTTROUGH, GENTPEAK, GENTRANDOM, TOBRATROUGH, TOBRAPEAK, TOBRARND, AMIKACINPEAK, AMIKACINTROU, AMIKACIN in the last 72 hours.   Microbiology: No results found for this or any previous visit (from the past 720 hour(s)).  Assessment: 39 YOM with 3 day worsening SOB, not responding to home inhalers who [facility]. Influenza panel pending.  [facility]  SCr 0.73, CrCl >16100mL/min. WBC elevated at 12.3, tmax 99.7  Goal of Therapy:  proper dosing based on renal and hepatic function  Plan:  -Levaquin 750mg  IV q24h starting tomorrow -follow c/s, influenza panel, renal function  Markeshia Giebel D. Salvator Seppala, PharmD, BCPS Clinical Pharmacist Pager: [phone] 09/14/2015 4:11 PM

## 2015-09-14 NOTE — ED Notes (Signed)
[facility].

## 2015-09-14 NOTE — Progress Notes (Signed)
Utilization Review Completed.Steven Dudley T1/25/2016  

## 2015-09-14 NOTE — ED Notes (Signed)
Patient states he has a three history of sob.  Has been using home nebs and nyquil with no relief.  Sob is associated with a congested non productive cough, chills and sweats.  History of COPD.

## 2015-09-15 DIAGNOSIS — R000 Tachycardia, unspecified: Secondary | ICD-10-CM

## 2015-09-15 LAB — BASIC METABOLIC PANEL
Anion gap: 7 (ref 5–15)
BUN: 7 mg/dL (ref 6–20)
CALCIUM: 9.1 mg/dL (ref 8.9–10.3)
CO2: 37 mmol/L — AB (ref 22–32)
CREATININE: 1.31 mg/dL — AB (ref 0.61–1.24)
Chloride: 97 mmol/L — ABNORMAL LOW (ref 101–111)
GFR calc Af Amer: >60 mL/min (ref >60)
GLUCOSE: 162 mg/dL — AB (ref 65–99)
Potassium: 4.6 mmol/L (ref 3.5–5.1)
Sodium: 141 mmol/L (ref 135–145)

## 2015-09-15 LAB — GLUCOSE, CAPILLARY
GLUCOSE-CAPILLARY: 149 mg/dL — AB (ref 65–99)
GLUCOSE-CAPILLARY: 179 mg/dL — AB (ref 65–99)
GLUCOSE-CAPILLARY: 163 mg/dL — AB (ref 65–99)
Glucose-Capillary: 168 mg/dL — ABNORMAL HIGH (ref 65–99)

## 2015-09-15 LAB — CBC
HCT: 53.1 % — ABNORMAL HIGH (ref 39.0–52.0)
Hemoglobin: 16.9 g/dL (ref 13.0–17.0)
MCH: 31.3 pg (ref 26.0–34.0)
MCHC: 31.8 g/dL (ref 30.0–36.0)
MCV: 98.3 fL (ref 78.0–100.0)
Platelets: 231 10*3/uL (ref 150–400)
RBC: 5.4 MIL/uL (ref 4.22–5.81)
RDW: 13.5 % (ref 11.5–15.5)
WBC: 10.9 10*3/uL — ABNORMAL HIGH (ref 4.0–10.5)

## 2015-09-15 LAB — HEMOGLOBIN A1C
HEMOGLOBIN A1C: 6.6 % — AB (ref 4.8–5.6)
MEAN PLASMA GLUCOSE: 143 mg/dL

## 2015-09-15 MED ORDER — INFLUENZA VAC SPLIT QUAD 0.5 ML IM SUSY
0.5000 mL | PREFILLED_SYRINGE | INTRAMUSCULAR | Status: AC
  Administered 2015-09-16: 0.5 mL via INTRAMUSCULAR
  Filled 2015-09-15: qty 0.5

## 2015-09-15 MED ORDER — FUROSEMIDE 10 MG/ML IJ SOLN: 20.0000 mg | Freq: Once | INTRAMUSCULAR | Status: DC

## 2015-09-15 MED ORDER — METHYLPREDNISOLONE SODIUM SUCC 125 MG IJ SOLR
60.0000 mg | Freq: Three times a day (TID) | INTRAMUSCULAR | Status: DC
  Administered 2015-09-15 – 2015-09-16 (×2): 60 mg via INTRAVENOUS
  Filled 2015-09-15 (×2): qty 2

## 2015-09-15 MED ORDER — PNEUMOCOCCAL VAC POLYVALENT 25 MCG/0.5ML IJ INJ
0.5000 mL | INJECTION | INTRAMUSCULAR | Status: AC
  Administered 2015-09-16: 0.5 mL via INTRAMUSCULAR
  Filled 2015-09-15: qty 0.5

## 2015-09-15 MED ORDER — LORAZEPAM 0.5 MG PO TABS
0.5000 mg | ORAL_TABLET | Freq: Four times a day (QID) | ORAL | Status: DC | PRN
  Administered 2015-09-15: 0.5 mg via ORAL
  Filled 2015-09-15: qty 1

## 2015-09-15 MED ORDER — NICOTINE 21 MG/24HR TD PT24
21.0000 mg | MEDICATED_PATCH | Freq: Every day | TRANSDERMAL | Status: DC
  Administered 2015-09-15 – 2015-09-17 (×3): 21 mg via TRANSDERMAL
  Filled 2015-09-15 (×3): qty 1

## 2015-09-15 NOTE — Evaluation (Signed)
Occupational Therapy Evaluation and Discharge Patient Details Name: Steven Dudley MRN: [id number] DOB: [birth year] Today's Date: 09/15/2015    History of Present Illness Steven Dudley is a 39 y.o. male with a Past Medical History of GERD, anxiety, h/o traumatic brain injury, HTN, asthma, smoker, COPD, who presents with acute respiratory failure.   Clinical Impression   This 39 yo male admitted with above presents to acute OT at an overall S level due to lines and need to monitor O2. Took increased time today due to having to stop several times even while talking to have pt purse lip breath to bring O2 back up to low 90's (from mid to high 80's). All education completed with pt and wife, acute OT will sign off.    Follow Up Recommendations  No OT follow up    Equipment Recommendations  None recommended by OT       Precautions / Restrictions Precautions Precaution Comments: Monitor O2 sats Restrictions Weight Bearing Restrictions: No      Mobility Bed Mobility Overal bed mobility: Independent                Transfers Overall transfer level: Needs assistance Equipment used: None Transfers: Sit to/from Stand Sit to Stand: Supervision                   ADL Overall ADL's : Needs assistance/impaired                                       General ADL Comments: S currently due to lines/tubes and need to monitor O2 sats. Educated pt on purse lipped breathing and other energy conservation techniques as well as issuing him a handout.     Vision Additional Comments: wears glasses all the time          Pertinent Vitals/Pain Pain Assessment: No/denies pain     Hand Dominance Right   Extremity/Trunk Assessment Upper Extremity Assessment Upper Extremity Assessment: Overall WFL for tasks assessed   Lower Extremity Assessment Lower Extremity Assessment: Defer to PT evaluation       Communication Communication Communication: No  difficulties   Cognition Arousal/Alertness: Awake/alert Behavior During Therapy: WFL for tasks assessed/performed Overall Cognitive Status: Within Functional Limits for tasks assessed                                Home Living Family/patient expects to be discharged to:: Private residence Living Arrangements: Spouse/significant other Available Help at Discharge: Family;Available 24 hours/day Type of Home: House Home Access: Stairs to enter [employer] of Steps: 1   Home Layout: One level     Bathroom Shower/Tub: Tub/shower unit;Curtain Shower/tub characteristics: Patent attorney: None          Prior Functioning/Environment Level of Independence: Independent        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    OT Diagnosis: Generalized weakness         OT Goals(Current goals can be found in the care plan section) Acute Rehab OT Goals Patient Stated Goal: home soon  OT Frequency:                End of Session Equipment Utilized During Treatment: Oxygen (6 liters--pt to let RN know when he finishes eating his sandwich to reapply Bipap) Nurse Communication:  (  NT also--pt needs to put nasal cannula on (6 liters)when ambulating to bathroom)  Activity Tolerance:  (limited by drop in O2 sats) Patient left: in chair;with call bell/phone within reach;with family/visitor present   Time: 1012-1043 OT Time Calculation (min): 31 min Charges:  OT General Charges $OT Visit: 1 Procedure OT Evaluation $Initial OT Evaluation Tier I: 1 Procedure OT Treatments $Self Care/Home Management : 8-22 mins  Evette GeorgesLeonard, Egan Berkheimer Steven Dudley [phone] 09/15/2015, 11:07 AM

## 2015-09-15 NOTE — Plan of Care (Addendum)
Problem: Food- and Nutrition-Related Knowledge Deficit (NB-1.1) Goal: Nutrition education Formal process to instruct or train a patient/client in a skill or to impart knowledge to help patients/clients voluntarily manage or modify food choices and eating behavior to maintain or improve health. Outcome: Completed/Met Date Met:  09/15/15  RD consulted for nutrition education regarding weight loss.  Body mass index is 42.18 kg/(m^2). Pt meets criteria for Obesity Class III based on current BMI.  RD provided "Weight Loss Tips" handout from the Academy of [employer]etics. Emphasized the importance of serving sizes and provided examples of correct portions of common foods. Discussed importance of controlled and consistent intake throughout the day. Provided examples of ways to balance meals/snacks and encouraged intake of high-fiber, whole grain complex carbohydrates. Emphasized the importance of hydration with calorie-free beverages and limiting sugar-sweetened beverages. Encouraged pt to discuss physical activity options with physician. Teach back method used.  Expect fair compliance.  Current diet order is Heart Healthy/Carbohydrate Modified, patient is consuming approximately 100% of meals at this time. Labs and medications reviewed. No further nutrition interventions warranted at this time. If additional nutrition issues arise, please re-consult RD.  NOTE:  Pt revealed that he smokes between 1 to 3 packs of cigarettes per day (depending on his stress level) and drinks 1 pint of liquor on a frequent basis.  He also reported that he feels he would benefit from an anti-anxiety medication.  Spoke to [employer], Tourist information centre manager.  Arthur Holms, RD, LDN Pager #: [phone] After-Hours Pager #: [phone]

## 2015-09-15 NOTE — Progress Notes (Signed)
OT Cancellation Note  Patient Details Name: Steven Dudley MRN: [id number] DOB: [birth year]   Cancelled Treatment:    Reason Eval/Treat Not Completed: Medical issues which prohibited therapy. Pt currently on Bipap.  Evette GeorgesLeonard, [name] [name] [phone] 09/15/2015, 8:17 AM

## 2015-09-15 NOTE — Care Management Note (Signed)
Case Management Note  Patient Details  Name: Sampson GoonMatthew Teagarden MRN: [id number] Date of Birth: 12/27/1975  Subjective/Objective:   Pt lives with spouse and 4 children, PCP is Delbert HarnessKim Briscoe.  Pt is self-employed and lost his Medicaid because he did well financially in 2015, income has been drastically lower in 2016 and pt needs to reapply.  Financial Counselor will see pt re Medicaid application.  Pt states he had CPAP prescribed previously but could not tolerate it, has not been on home O2.                             Expected Discharge Plan:  Home/Self Care  In-House Referral:  Financial Counselor  Discharge planning Services  CM Consult  Status of Service:  In process, will continue to follow  Magdalene RiverMayo, Darian Ace T, RN 09/15/2015, 10:54 AM

## 2015-09-15 NOTE — Progress Notes (Signed)
PT Cancellation Note  Patient Details Name: Steven Dudley MRN: [id number] DOB: 1976/04/06   Cancelled Treatment:    Reason Eval/Treat Not Completed:  (awaiting for pt to come off BiPap to allow for OOB mobility assessment. RN reports that should be later today. PT to return as able.   Marcene BrawnChadwell, [name] [name] 09/15/2015, 8:25 AM  Lewis ShockAshly Maeola Mchaney, PT, DPT Pager #: [phone] Office #: [phone]

## 2015-09-15 NOTE — Progress Notes (Signed)
[facility] PROGRESS NOTE  Steven GoonMatthew Dudley [id number] DOB: 1976/08/08 DOA: 09/14/2015 PCP: Delbert HarnessBRISCOE, KIM, MD  Brief Summary  The patient is a 39 year old male with history of COPD, ongoing tobacco abuse, hypertension, obesity who presented from [facility] with acute hypoxic respiratory failure secondary to COPD exacerbation.  He reported a 4 day history of gradually worsening shortness of breath with sinus congestion.  His vital signs were concerning for oxygen saturation of 72% on room air which improved to 96% when placed on BiPAP. His chest x-ray demonstrated mild pulmonary interstitial markings consistent with viral or atypical respiratory infection.  Assessment/Plan   Acute respiratory failure with hypoxia. Likely related to COPD exacerbation in the setting of possible upper respiratory infection and obesity - continue BiPAP with 1 hour breaks for meals -Continue budesonide, brovana, duonebs - continue solumedrol but decrease to q8h -Patient is not on oxygen at home -Home medications include Dulera twice a day -Has had pulmonary function tests in the past  Possible atypical pneumonia on CXR vs. Viral illness -Influenza panel neg -Levaquin per pharmacy  Chest pain, pleuritic. Reproducible worse with cough and unlikely to be ACS - troponins negative x 2, no further testing - ECG with sinus tach, but no ST segment elevations - Tele:  Sinus tach, okay to d/c telemetry  Hypertension, BP trending down with improvement in respiratory distress - hold Cozaar today due to mild AKI -Provide when necessary hydralazine  Sinus tachycardia due to respiratory distress and nebulizer treatments - TSH 0.977  Mild AKI -  Hold cozaar -  No IVF -  Advance diet -  Repeat BMP in AM  Hyperglycemia -  hemoglobin A1c - continue low dose SSI   Obesity. BMI 44.7 -  Appreciate nutrition assistance  Tobacco use. Continues to smoke -Nicotine patch -Cessation counseling  offered  Diet:  Diabetic, healthy heart Access:  PIV IVF:  off Proph:  lovenox  Code Status: full Family Communication: patient alone Disposition Plan: continue stepdown for ongoing bipap    Consultants:  none  Procedures:  none  Antibiotics:  Levofloxacin 12/5 >    HPI/Subjective:  Still dyspneic when he removes his bipap mask, but would like to try to eat a meal with nasal canula on.  Chronic swelling of ankles which is stable.  No fevers since admission.     Objective: Filed Vitals:   09/15/15 0813 09/15/15 0826 09/15/15 1246 09/15/15 1500  BP:  139/81 157/87 125/87  Pulse: 100 93 96 94  Temp:  97.7 F (36.5 C) 97.7 F (36.5 C) 97.7 F (36.5 C)  TempSrc:  Axillary Axillary Axillary  Resp: 22 21 16 21   Height:      Weight:      SpO2: 92% 91% 93% 92%    Intake/Output Summary (Last 24 hours) at 09/15/15 1627 Last data filed at 09/15/15 1300  Gross per 24 hour  Intake   2768 ml  Output   1901 ml  Net    867 ml   Filed Weights   09/14/15 1027 09/14/15 1425 09/15/15 0618  Weight: 129.275 kg (285 lb) 119.8 kg (264 lb 1.8 oz) 122.2 kg (269 lb 6.4 oz)   Body mass index is 42.18 kg/(m^2).  Exam:   General:  Obese male, SCM retractions and forced exhalations.  Helped him replace bipap after exam complete  HEENT:  NCAT, MMM  Cardiovascular:  Tachycardic RR, nl S1, S2 no mrg, 2+ pulses, warm extremities  Respiratory:  Diminished at bilateral bases with high  pitched expiratory wheeze throughout and prolonged I:E  Abdomen:   NABS, soft, NT/ND  MSK:   Normal tone and bulk, trace edema of the ankles bilaterally  Neuro:  Grossly intact  Data Reviewed: Basic Metabolic Panel:  Recent Labs Lab 09/14/15 1025 09/15/15 0259  NA 139 141  K 3.5 4.6  CL 97* 97*  CO2 32 37*  GLUCOSE 142* 162*  BUN 5* 7  CREATININE 0.73 1.31*  CALCIUM 9.1 9.1   Liver Function Tests: No results for input(s): AST, ALT, ALKPHOS, BILITOT, PROT, ALBUMIN in the last 168  hours. No results for input(s): LIPASE, AMYLASE in the last 168 hours. No results for input(s): AMMONIA in the last 168 hours. CBC:  Recent Labs Lab 09/14/15 1025 09/15/15 0259  WBC 12.3* 10.9*  NEUTROABS 9.9*  --   HGB 16.5 16.9  HCT 49.8 53.1*  MCV 93.3 98.3  PLT 230 231    Recent Results (from the past 240 hour(s))  MRSA PCR Screening     Status: None   Collection Time: 09/14/15  3:31 PM  Result Value Ref Range Status   MRSA by PCR NEGATIVE NEGATIVE Final    Comment:        The GeneXpert MRSA Assay (FDA approved for NASAL specimens only), is one component of a comprehensive MRSA colonization surveillance program. It is not intended to diagnose MRSA infection nor to guide or monitor treatment for MRSA infections.      Studies: Dg Chest Port 1 View  09/14/2015  CLINICAL DATA:  39 year old male with extreme shortness of breath. COPD, smoker, asthma. Initial encounter. EXAM: PORTABLE CHEST 1 VIEW COMPARISON:  08/27/2014 and earlier. FINDINGS: Portable AP semi upright view at 1037 hours. Stable lung volumes. Stable cardiac size at the upper limits of normal. Other mediastinal contours are within normal limits. Visualized tracheal air column is within normal limits. No pneumothorax, pleural effusion or consolidation. No definite pulmonary edema, increased interstitial markings are stable to mildly progressed. IMPRESSION: 1. Stable to mildly grade progressed pulmonary interstitial markings. Consider viral/atypical respiratory infection. 2. No other acute cardiopulmonary abnormality. Electronically Signed   By: Odessa Fleming M.D.   On: 09/14/2015 10:51    Scheduled Meds: . albuterol  2.5 mg Nebulization Q4H  . antiseptic oral rinse  7 mL Mouth Rinse q12n4p  . chlorhexidine  15 mL Mouth Rinse BID  . enoxaparin (LOVENOX) injection  40 mg Subcutaneous Q24H  . famotidine  10 mg Oral BID  . [START ON 09/16/2015] Influenza vac split quadrivalent PF  0.5 mL Intramuscular Tomorrow-1000  .  insulin aspart  0-15 Units Subcutaneous TID WC  . insulin aspart  0-5 Units Subcutaneous QHS  . levofloxacin (LEVAQUIN) IV  750 mg Intravenous Q24H  . methylPREDNISolone (SOLU-MEDROL) injection  60 mg Intravenous Q6H  . [START ON 09/16/2015] pneumococcal 23 valent vaccine  0.5 mL Intramuscular Tomorrow-1000  . sodium chloride  3 mL Intravenous Q12H  . sucralfate  1 g Oral TID WC & HS   Continuous Infusions:   Principal Problem:   Acute respiratory failure with hypoxia (HCC) Active Problems:   URI (upper respiratory infection)   Acid reflux   Obesity   Hypertension   COPD (chronic obstructive pulmonary disease) (HCC)   Chest pain   Cough   COPD exacerbation (HCC)   Tobacco use disorder   Tachycardia   Hyperglycemia    Time spent: 30 min    Fidelis Loth  [facility] Pager [phone]. If 7PM-7AM, please contact night-coverage at  [email or web address], password [facility] 09/15/2015, 4:27 PM  LOS: 1 day

## 2015-09-16 LAB — BASIC METABOLIC PANEL
Anion gap: 6 (ref 5–15)
BUN: 19 mg/dL (ref 6–20)
CO2: 38 mmol/L — AB (ref 22–32)
CREATININE: 0.96 mg/dL (ref 0.61–1.24)
Calcium: 9 mg/dL (ref 8.9–10.3)
Chloride: 93 mmol/L — ABNORMAL LOW (ref 101–111)
GLUCOSE: 169 mg/dL — AB (ref 65–99)
Potassium: 4.7 mmol/L (ref 3.5–5.1)
Sodium: 137 mmol/L (ref 135–145)

## 2015-09-16 LAB — GLUCOSE, CAPILLARY
GLUCOSE-CAPILLARY: 177 mg/dL — AB (ref 65–99)
GLUCOSE-CAPILLARY: 193 mg/dL — AB (ref 65–99)
GLUCOSE-CAPILLARY: 165 mg/dL — AB (ref 65–99)
Glucose-Capillary: 168 mg/dL — ABNORMAL HIGH (ref 65–99)

## 2015-09-16 MED ORDER — METHYLPREDNISOLONE SODIUM SUCC 125 MG IJ SOLR
60.0000 mg | Freq: Two times a day (BID) | INTRAMUSCULAR | Status: DC
  Administered 2015-09-16 – 2015-09-17 (×2): 60 mg via INTRAVENOUS
  Filled 2015-09-16 (×2): qty 2

## 2015-09-16 MED ORDER — LEVOFLOXACIN 750 MG PO TABS
750.0000 mg | ORAL_TABLET | Freq: Every day | ORAL | Status: DC
  Administered 2015-09-16 – 2015-09-17 (×2): 750 mg via ORAL
  Filled 2015-09-16 (×2): qty 1

## 2015-09-16 MED ORDER — IPRATROPIUM BROMIDE 0.02 % IN SOLN
RESPIRATORY_TRACT | Status: AC
  Administered 2015-09-16: 0.5 mg
  Filled 2015-09-16: qty 2.5

## 2015-09-16 MED ORDER — ALBUTEROL SULFATE (2.5 MG/3ML) 0.083% IN NEBU
2.5000 mg | INHALATION_SOLUTION | Freq: Four times a day (QID) | RESPIRATORY_TRACT | Status: DC
  Administered 2015-09-17: 2.5 mg via RESPIRATORY_TRACT
  Filled 2015-09-16: qty 3

## 2015-09-16 MED ORDER — IPRATROPIUM BROMIDE 0.02 % IN SOLN
0.5000 mg | Freq: Four times a day (QID) | RESPIRATORY_TRACT | Status: DC
  Administered 2015-09-16 – 2015-09-17 (×4): 0.5 mg via RESPIRATORY_TRACT
  Filled 2015-09-16 (×4): qty 2.5

## 2015-09-16 MED ORDER — BUSPIRONE HCL 5 MG PO TABS
10.0000 mg | ORAL_TABLET | Freq: Two times a day (BID) | ORAL | Status: DC
  Administered 2015-09-16 – 2015-09-17 (×3): 10 mg via ORAL
  Filled 2015-09-16 (×3): qty 2

## 2015-09-16 NOTE — Plan of Care (Signed)
Problem: Acute Rehab PT Goals(only PT should resolve) Goal: Pt Will Ambulate Maintain Sp02>90% on oxygen level prescribed by MD Goal: Pt Will Go Up/Down Stairs Maintain Sp02>90% on oxygen level prescribed by MD

## 2015-09-16 NOTE — Evaluation (Signed)
Physical Therapy Evaluation Patient Details Name: Steven Dudley MRN: [id number] DOB: September 15, 1976 Today's Date: 09/16/2015   History of Present Illness  Steven Dudley is a 39 y.o. male with a Past Medical History of GERD, anxiety, h/o traumatic brain injury, HTN, asthma, smoker, COPD, who presents with acute respiratory failure.  Clinical Impression  Pt was eager to get up and walk upon arrival. Pt was able to stand on his own without any support and ambulate with only supervision to monitor vitals. SpO2>89% on 6L/min O2 throughout session, SpO2 was steady at 89% during ambulation and stair navigation. We did not attempt to evaluate SpO2 on room air per MD's orders. Pt would benefit from acute PT to improve activity tolerance to increase independence. Once medically stable, D/C to home is most appropriate at this time.     Follow Up Recommendations No PT follow up    Equipment Recommendations  None recommended by PT    Recommendations for Other Services       Precautions / Restrictions Precautions Precaution Comments: Monitor O2 sats Restrictions Weight Bearing Restrictions: No      Mobility  Bed Mobility Overal bed mobility: Independent                Transfers Overall transfer level: Needs assistance Equipment used: None Transfers: Sit to/from Stand Sit to Stand: Independent            Ambulation/Gait Ambulation/Gait assistance: Supervision Ambulation Distance (Feet): 200 Feet Assistive device: None Gait Pattern/deviations: WFL(Within Functional Limits)   Gait velocity interpretation: at or above normal speed for age/gender General Gait Details: Monitored SpO2 throughout gait. SpO2 =89% on 6L/min of O2  Stairs Stairs: Yes Stairs assistance: Supervision   Number of Stairs: 3 General stair comments: Supervision for safety and monitored SpO2  Wheelchair Mobility    Modified Rankin (Stroke Patients Only)       Balance Overall balance  assessment: Independent;No apparent balance deficits (not formally assessed)                                           Pertinent Vitals/Pain Pain Assessment: No/denies pain    Home Living Family/patient expects to be discharged to:: Private residence Living Arrangements: Spouse/significant other Available Help at Discharge: Family;Available 24 hours/day Type of Home: House Home Access: Stairs to enter   [employer] of Steps: 1 Home Layout: One level Home Equipment: None      Prior Function Level of Independence: Independent         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     Hand Dominance   Dominant Hand: Right    Extremity/Trunk Assessment   Upper Extremity Assessment: Overall WFL for tasks assessed           Lower Extremity Assessment: Overall WFL for tasks assessed      Cervical / Trunk Assessment: Normal  Communication   Communication: No difficulties  Cognition Arousal/Alertness: Awake/alert Behavior During Therapy: WFL for tasks assessed/performed Overall Cognitive Status: Within Functional Limits for tasks assessed                      General Comments General comments (skin integrity, edema, etc.): spoke with MD if wanted to decreased O2 and monitor SpO2 however she deferred at this time due to pt remaining on high dose steriods and at 90% on 6Lo2    Exercises  Assessment/Plan    PT Assessment Patient needs continued PT services  PT Diagnosis Difficulty walking (due to impaired oxygen saturation)   PT Problem List Decreased activity tolerance;Decreased mobility;Cardiopulmonary status limiting activity  PT Treatment Interventions Functional mobility training;Patient/family education;Therapeutic exercise;Gait training;Stair training;Therapeutic activities   PT Goals (Current goals can be found in the Care Plan section) Acute Rehab PT Goals Patient Stated Goal: To get back to work PT Goal  Formulation: With patient Time For Goal Achievement: 09/23/15 Potential to Achieve Goals: Good    Frequency Min 3X/week   Barriers to discharge        Co-evaluation               End of Session Equipment Utilized During Treatment: Gait belt Activity Tolerance: Patient tolerated treatment well Patient left: in chair;with call bell/phone within reach Nurse Communication: Mobility status         Time: 1610-96040806-0831 PT Time Calculation (min) (ACUTE ONLY): 25 min   Charges:   PT Evaluation $Initial PT Evaluation Tier I: 1 Procedure PT Treatments $Gait Training: 8-22 mins   PT G CodesJimmy Dudley:        Steven Dudley 09/16/2015, 9:05 AM Steven Dudley, SPT 09/16/2015 9:05 AM

## 2015-09-16 NOTE — Progress Notes (Signed)
[facility] PROGRESS NOTE  Steven Dudley ZOX:[id number] DOB: 22-Mar-1976 DOA: 09/14/2015 PCP: Delbert Harness, MD  Brief Summary  The patient is a 39 year old male with history of COPD, ongoing tobacco abuse, hypertension, obesity who presented from [facility] with acute hypoxic respiratory failure secondary to COPD exacerbation.  He reported a 4 day history of gradually worsening shortness of breath with sinus congestion.  His vital signs were concerning for oxygen saturation of 72% on room air which improved to 96% when placed on BiPAP. His chest x-ray demonstrated mild pulmonary interstitial markings consistent with viral or atypical respiratory infection.  Assessment/Plan   Acute respiratory failure with hypoxia. Secondary to pneumonia and COPD exacerbation. needs to quit smoking Slowly improving. Required bipap overnight, but on [place] now. Change Solu-Medrol to every 12 hours. Change Levaquin to by mouth. Continue bronchodilators.  Possible atypical pneumonia on CXR. Check urine Legionella antigen. Check urine strep pneumonia antigen. Influenza panel negative.  Chest pain, secondary to pneumonia and COPD exacerbation.  Hypertension, stable  Mild AKI Resolved  Hyperglycemia -  hemoglobin A1c 6.6 - continue low dose SSI   Obesity. BMI 44.7 -  Appreciate nutrition assistance  Tobacco use. -Nicotine patch -Cessation counseling offered  Anxiety Has been on BuSpar in the past, 10 mg twice a day. Requests resuming this.  Will consult care management to assist with referral to [facility].  Diet:  Diabetic, healthy heart Access:  PIV IVF:  off Proph:  lovenox  Code Status: full Family Communication: patient alone Disposition Plan: continue stepdown. Transfer out tomorrow if no BiPAP requirement.   Consultants:  none  Procedures:  none  Antibiotics:  Levofloxacin 12/5 >    HPI/Subjective:  Breathing easier. Required bipap  overnight. Coughing up yellow.  C/o anxiety. Requests resuming buspar 10 mg daily, which he had been on in the past.  Interested in quitting tobacco (smokes 3 ppd). Wants to change PCP to [facility], as he recently lost insurance  Objective: Filed Vitals:   09/16/15 0441 09/16/15 0444 09/16/15 0455 09/16/15 0600  BP:      Pulse:   95 89  Temp:      TempSrc:      Resp:   20 12  Height:      Weight:  122.154 kg (269 lb 4.8 oz)    SpO2: 95%  93% 92%    Intake/Output Summary (Last 24 hours) at 09/16/15 0802 Last data filed at 09/16/15 0444  Gross per 24 hour  Intake   1083 ml  Output   1076 ml  Net      7 ml   Filed Weights   09/14/15 1425 09/15/15 0618 09/16/15 0444  Weight: 119.8 kg (264 lb 1.8 oz) 122.2 kg (269 lb 6.4 oz) 122.154 kg (269 lb 4.8 oz)   Body mass index is 42.17 kg/(m^2).  Exam:   General:  On nasal cannula. Talkative. Breathing nonlabored. Alert and oriented.   Cardiovascular:  regular rate rhythm without murmurs gallops rubs  Respiratory:  good air movement. Diminished throughout. No wheeze rhonchi or rales.   Abdomen:   NABS, soft, NT/ND  ext:   no clubbing cyanosis or edema   Data Reviewed: Basic Metabolic Panel:  Recent Labs Lab 09/14/15 1025 09/15/15 0259 09/16/15 0330  NA 139 141 137  K 3.5 4.6 4.7  CL 97* 97* 93*  CO2 32 37* 38*  GLUCOSE 142* 162* 169*  BUN 5* 7 19  CREATININE 0.73 1.31* 0.96  CALCIUM 9.1  9.1 9.0   Liver Function Tests: No results for input(s): AST, ALT, ALKPHOS, BILITOT, PROT, ALBUMIN in the last 168 hours. No results for input(s): LIPASE, AMYLASE in the last 168 hours. No results for input(s): AMMONIA in the last 168 hours. CBC:  Recent Labs Lab 09/14/15 1025 09/15/15 0259  WBC 12.3* 10.9*  NEUTROABS 9.9*  --   HGB 16.5 16.9  HCT 49.8 53.1*  MCV 93.3 98.3  PLT 230 231    Recent Results (from the past 240 hour(s))  MRSA PCR Screening     Status: None   Collection Time: 09/14/15  3:31 PM  Result Value Ref  Range Status   MRSA by PCR NEGATIVE NEGATIVE Final    Comment:        The GeneXpert MRSA Assay (FDA approved for NASAL specimens only), is one component of a comprehensive MRSA colonization surveillance program. It is not intended to diagnose MRSA infection nor to guide or monitor treatment for MRSA infections.      Studies: Dg Chest Port 1 View  09/14/2015  CLINICAL DATA:  39 year old male with extreme shortness of breath. COPD, smoker, asthma. Initial encounter. EXAM: PORTABLE CHEST 1 VIEW COMPARISON:  08/27/2014 and earlier. FINDINGS: Portable AP semi upright view at 1037 hours. Stable lung volumes. Stable cardiac size at the upper limits of normal. Other mediastinal contours are within normal limits. Visualized tracheal air column is within normal limits. No pneumothorax, pleural effusion or consolidation. No definite pulmonary edema, increased interstitial markings are stable to mildly progressed. IMPRESSION: 1. Stable to mildly grade progressed pulmonary interstitial markings. Consider viral/atypical respiratory infection. 2. No other acute cardiopulmonary abnormality. Electronically Signed   By: Odessa FlemingH  Hall M.D.   On: 09/14/2015 10:51    Scheduled Meds: . albuterol  2.5 mg Nebulization Q4H  . antiseptic oral rinse  7 mL Mouth Rinse q12n4p  . chlorhexidine  15 mL Mouth Rinse BID  . enoxaparin (LOVENOX) injection  40 mg Subcutaneous Q24H  . famotidine  10 mg Oral BID  . Influenza vac split quadrivalent PF  0.5 mL Intramuscular Tomorrow-1000  . insulin aspart  0-15 Units Subcutaneous TID WC  . insulin aspart  0-5 Units Subcutaneous QHS  . levofloxacin (LEVAQUIN) IV  750 mg Intravenous Q24H  . methylPREDNISolone (SOLU-MEDROL) injection  60 mg Intravenous 3 times per day  . nicotine  21 mg Transdermal Daily  . pneumococcal 23 valent vaccine  0.5 mL Intramuscular Tomorrow-1000  . sodium chloride  3 mL Intravenous Q12H  . sucralfate  1 g Oral TID WC & HS   Continuous Infusions:    Principal Problem:   Acute respiratory failure with hypoxia (HCC) Active Problems:   URI (upper respiratory infection)   Acid reflux   Obesity   Hypertension   COPD (chronic obstructive pulmonary disease) (HCC)   Chest pain   Cough   COPD exacerbation (HCC)   Tobacco use disorder   Tachycardia   Hyperglycemia    Time spent: 35 min    Gaylyn Berish L  [facility] [email or web address], password [email or web address] 09/16/2015, 8:02 AM  LOS: 2 days

## 2015-09-17 DIAGNOSIS — R7303 Prediabetes: Secondary | ICD-10-CM | POA: Diagnosis present

## 2015-09-17 LAB — GLUCOSE, CAPILLARY
GLUCOSE-CAPILLARY: 146 mg/dL — AB (ref 65–99)
GLUCOSE-CAPILLARY: 255 mg/dL — AB (ref 65–99)

## 2015-09-17 MED ORDER — NICOTINE 21 MG/24HR TD PT24: 21.0000 mg | MEDICATED_PATCH | Freq: Every day | TRANSDERMAL | Status: DC

## 2015-09-17 MED ORDER — PREDNISONE 10 MG PO TABS: ORAL_TABLET | ORAL | Status: DC

## 2015-09-17 MED ORDER — ALBUTEROL SULFATE HFA 108 (90 BASE) MCG/ACT IN AERS: 2.0000 | INHALATION_SPRAY | RESPIRATORY_TRACT | Status: DC | PRN

## 2015-09-17 MED ORDER — OXYCODONE HCL 5 MG PO TABS: 5.0000 mg | ORAL_TABLET | ORAL | Status: DC | PRN

## 2015-09-17 MED ORDER — LEVOFLOXACIN 750 MG PO TABS: 750.0000 mg | ORAL_TABLET | Freq: Every day | ORAL | Status: DC

## 2015-09-17 MED ORDER — BUSPIRONE HCL 10 MG PO TABS: 10.0000 mg | ORAL_TABLET | Freq: Two times a day (BID) | ORAL | Status: DC

## 2015-09-17 MED ORDER — BENZONATATE 200 MG PO CAPS: 200.0000 mg | ORAL_CAPSULE | Freq: Three times a day (TID) | ORAL | Status: DC | PRN

## 2015-09-17 MED ORDER — MOMETASONE FURO-FORMOTEROL FUM 100-5 MCG/ACT IN AERO: 2.0000 | INHALATION_SPRAY | Freq: Two times a day (BID) | RESPIRATORY_TRACT | Status: DC

## 2015-09-17 NOTE — Care Management Note (Signed)
Case Management Note  Patient Details  Name: Steven Dudley MRN: [id number] Date of Birth: 1976/02/25  Subjective/Objective:    [facility]  TC reveals they are not accepting new pts now, [facility].  Visit scheduled and information provided to pt as well as added to d/c instructions.  Financial Counselor assisted with Medicaid application and pt will be assigned PCP when approved.   Pt discharging with home O2, tank delivered to room.  Provided MATCH letter to pt and he plans to fill scripts @ [facility].                Expected Discharge Plan:  Home/Self Care  In-House Referral:  Financial Counselor  Discharge planning Services  CM Consult, MATCH Program, Follow-up appt scheduled  Post Acute Care Choice:  Durable Medical Equipment  DME Arranged:  Oxygen DME Agency:  [facility]  Status of Service:  Completed, signed off  Magdalene RiverMayo, Amalee Olsen T, [place]RN 09/17/2015, 12:49 PM

## 2015-09-17 NOTE — Discharge Summary (Signed)
Physician Discharge Summary  Steven GoonMatthew Dudley ZOX:[id number] DOB: 1975/12/21 DOA: 09/14/2015  PCP: Delbert HarnessBRISCOE, KIM, MD  Admit date: 09/14/2015 Discharge date: 09/17/2015  Time spent: greater than 30 minutes  Recommendations for Outpatient Follow-up:  1. Monitor oxygen saturations/continued need for home oxygen   Discharge Diagnoses:  Principal Problem:   Acute respiratory failure with hypoxia (HCC) Active Problems:   URI (upper respiratory infection)   Acid reflux   Obesity   Hypertension   Chest pain   COPD exacerbation (HCC)   Tobacco use disorder   Tachycardia   Hyperglycemia   Discharge Condition: stable  Diet recommendation: heart healthy carbohydrate modified  Filed Weights   09/15/15 0618 09/16/15 0444 09/17/15 0500  Weight: 122.2 kg (269 lb 6.4 oz) 122.154 kg (269 lb 4.8 oz) 122.4 kg (269 lb 13.5 oz)    History of present illness/Hospital Course:  39 year old male with history of COPD, ongoing tobacco abuse, hypertension, obesity who presented from [facility] with acute hypoxic respiratory failure secondary to COPD exacerbation. He reported a 4 day history of gradually worsening shortness of breath with sinus congestion. His vital signs were concerning for oxygen saturation of 72% on room air which improved to 96% when placed on BiPAP. His chest x-ray demonstrated mild pulmonary interstitial markings consistent with viral or atypical respiratory infection.  Acute respiratory failure with hypoxia. Secondary to pneumonia and COPD exacerbation. Admitted to stepdown, started on steroids, bipap, antibiotics, bronchodilators. Smoking cessation encouraged. By discharge, able to ambulate, has remained off bipap, but saturations remained 82% on room air. Patient requesting discharge. Home oxygen arranged.  probable pneumonia CXR consistent with atypical pneumonia. Influenza PCR negative. Started on CAP coverage.  Urine legionella and strep pneumonia antigens ordered, but  not resulted.    Chest pain, secondary to pneumonia and COPD exacerbation.  Hypertension, stable  Mild AKI Resolved with hydration  prediabetes - hemoglobin A1c 6.6. Carbohydrate modified diet recommended.  Tobacco use. -Nicotine patch -Cessation counseled  Anxiety Has been on BuSpar in the past, 10 mg twice a day. Requests resuming this. Have written prescription  Procedures:  none  Consultations:  none  Discharge Exam: Filed Vitals:   09/17/15 0028 09/17/15 0440  BP: 151/75 152/93  Pulse:    Temp: 97.9 F (36.6 C) 98.2 F (36.8 C)  Resp:      General: talkative. comfortable Cardiovascular: RRR Respiratory: CTA  Discharge Instructions   Discharge Instructions    Diet Carb Modified    Complete by:  As directed      Increase activity slowly    Complete by:  As directed           Current Discharge Medication List    START taking these medications   Details  benzonatate (TESSALON) 200 MG capsule Take 1 capsule (200 mg total) by mouth 3 (three) times daily as needed for cough. Qty: 20 capsule, Refills: 0    busPIRone (BUSPAR) 10 MG tablet Take 1 tablet (10 mg total) by mouth 2 (two) times daily. Qty: 60 tablet, Refills: 1    levofloxacin (LEVAQUIN) 750 MG tablet Take 1 tablet (750 mg total) by mouth daily. Qty: 2 tablet, Refills: 0    nicotine (NICODERM CQ - DOSED IN MG/24 HOURS) 21 mg/24hr patch Place 1 patch (21 mg total) onto the skin daily. Qty: 28 patch, Refills: 0    predniSONE (DELTASONE) 10 MG tablet 4 tablets po daily for 2 days then decrease by 1 tab po q 2 days until off Qty: 20 tablet,  Refills: 0      CONTINUE these medications which have CHANGED   Details  albuterol (PROVENTIL HFA;VENTOLIN HFA) 108 (90 BASE) MCG/ACT inhaler Inhale 2 puffs into the lungs every 4 (four) hours as needed for wheezing or shortness of breath. Qty: 1 each, Refills: 0    mometasone-formoterol (DULERA) 100-5 MCG/ACT AERO Inhale 2 puffs into the lungs 2  (two) times daily. Qty: 1 Inhaler, Refills: 0    oxyCODONE (ROXICODONE) 5 MG immediate release tablet Take 1 tablet (5 mg total) by mouth every 4 (four) hours as needed for severe pain. Qty: 12 tablet, Refills: 0      CONTINUE these medications which have NOT CHANGED   Details  cyclobenzaprine (FLEXERIL) 10 MG tablet Take 10 mg by mouth 3 (three) times daily as needed for muscle spasms.    losartan (COZAAR) 100 MG tablet Take 100 mg by mouth daily.    Phenyleph-Doxylamine-DM-APAP (TYLENOL COLD MULTI-SYMPTOM) 5-6.25-10-325 MG/15ML LIQD Take 30 mLs by mouth daily as needed (for cold).    Pseudoeph-Doxylamine-DM-APAP (DAYQUIL/NYQUIL COLD/FLU RELIEF PO) Take 2 capsules by mouth daily as needed (for cold).    ranitidine (ZANTAC) 150 MG tablet Take 150 mg by mouth daily.     promethazine (PHENERGAN) 25 MG tablet Take 1 tablet (25 mg total) by mouth every 8 (eight) hours as needed for nausea or vomiting. Qty: 15 tablet, Refills: 0    sucralfate (CARAFATE) 1 GM/10ML suspension Take 10 mLs (1 g total) by mouth 4 (four) times daily -  with meals and at bedtime. Qty: 420 mL, Refills: 0       Allergies  Allergen Reactions  . Acetaminophen Other (See Comments)    GI BLEEDING  . Shellfish Allergy Nausea And Vomiting, Nausea Only and Other (See Comments)    Headache, heat flashes.      The results of significant diagnostics from this hospitalization (including imaging, microbiology, ancillary and laboratory) are listed below for reference.    Significant Diagnostic Studies: Dg Chest Port 1 View  09/14/2015  CLINICAL DATA:  39 year old male with extreme shortness of breath. COPD, smoker, asthma. Initial encounter. EXAM: PORTABLE CHEST 1 VIEW COMPARISON:  08/27/2014 and earlier. FINDINGS: Portable AP semi upright view at 1037 hours. Stable lung volumes. Stable cardiac size at the upper limits of normal. Other mediastinal contours are within normal limits. Visualized tracheal air column is  within normal limits. No pneumothorax, pleural effusion or consolidation. No definite pulmonary edema, increased interstitial markings are stable to mildly progressed. IMPRESSION: 1. Stable to mildly grade progressed pulmonary interstitial markings. Consider viral/atypical respiratory infection. 2. No other acute cardiopulmonary abnormality. Electronically Signed   By: Odessa Fleming M.D.   On: 09/14/2015 10:51    Microbiology: Recent Results (from the past 240 hour(s))  MRSA PCR Screening     Status: None   Collection Time: 09/14/15  3:31 PM  Result Value Ref Range Status   MRSA by PCR NEGATIVE NEGATIVE Final    Comment:        The GeneXpert MRSA Assay (FDA approved for NASAL specimens only), is one component of a comprehensive MRSA colonization surveillance program. It is not intended to diagnose MRSA infection nor to guide or monitor treatment for MRSA infections.      Labs: Basic Metabolic Panel:  Recent Labs Lab 09/14/15 1025 09/15/15 0259 09/16/15 0330  NA 139 141 137  K 3.5 4.6 4.7  CL 97* 97* 93*  CO2 32 37* 38*  GLUCOSE 142* 162* 169*  BUN 5*  7 19  CREATININE 0.73 1.31* 0.96  CALCIUM 9.1 9.1 9.0   Liver Function Tests: No results for input(s): AST, ALT, ALKPHOS, BILITOT, PROT, ALBUMIN in the last 168 hours. No results for input(s): LIPASE, AMYLASE in the last 168 hours. No results for input(s): AMMONIA in the last 168 hours. CBC:  Recent Labs Lab 09/14/15 1025 09/15/15 0259  WBC 12.3* 10.9*  NEUTROABS 9.9*  --   HGB 16.5 16.9  HCT 49.8 53.1*  MCV 93.3 98.3  PLT 230 231   Cardiac Enzymes:  Recent Labs Lab 09/14/15 1643 09/14/15 2003  TROPONINI 0.03 0.03   BNP: BNP (last 3 results) No results for input(s): BNP in the last 8760 hours.  ProBNP (last 3 results) No results for input(s): PROBNP in the last 8760 hours.  CBG:  Recent Labs Lab 09/15/15 2034 09/16/15 0813 09/16/15 1209 09/16/15 1710 09/16/15 2210  GLUCAP 168* 177* 168* 193* 165*        Signed:  Canyon Lohr L  [facility] 09/17/2015, 8:28 AM

## 2015-09-17 NOTE — Progress Notes (Signed)
Pharmacy Antibiotic Time-Out Note  Steven Dudley is a 39 y.o. year-old male admitted on 09/14/2015.  The patient is currently on Levaquin for COPD exacerbation.  Assessment/Plan: [age] YOM who presented on 12/5 with worsening SOB and cough concerning for COPD exacerbation. The patient was started on IV steroids and Levaquin.  The intended length of therapy for Levaquin for COPD exacerbation appears to be 5 days. The MD is planning discharge today with 2 doses for today and tomorrow to complete a 5 day duration.    Plan 1. Continue Levaquin 750 mg po daily 2. Consider entering a stop date for 12/9 3. Will continue to follow renal function, culture results, LOT, and antibiotic de-escalation plans   Temp (24hrs), Avg:97.9 F (36.6 C), Min:97.1 F (36.2 C), Max:98.5 F (36.9 C)   Recent Labs Lab 09/14/15 1025 09/15/15 0259  WBC 12.3* 10.9*    Recent Labs Lab 09/14/15 1025 09/15/15 0259 09/16/15 0330  CREATININE 0.73 1.31* 0.96   Estimated Creatinine Clearance: 130.7 mL/min (by C-G formula based on Cr of 0.96).    Antimicrobial allergies: n/a  Antimicrobials this admission: Levaquin 12/5 >>  Levels/dose changes this admission: n/a  Microbiology Results: 12/5 MRSA PCR >> neg  Thank you for allowing pharmacy to be a part of this patient's care.  Georgina PillionElizabeth Denise Washburn, PharmD, BCPS Clinical Pharmacist Pager: [phone] 09/17/2015 9:00 AM

## 2015-09-17 NOTE — Progress Notes (Signed)
Physical Therapy Treatment Patient Details Name: Steven GoonMatthew Matzke MRN: [id number] DOB: [birth year] Today's Date: 09/17/2015    History of Present Illness Steven GoonMatthew Cassidy is a 39 y.o. male with a Past Medical History of GERD, anxiety, h/o traumatic brain injury, HTN, asthma, smoker, COPD, who presents with acute respiratory failure.    PT Comments    Decreased O2 needed for mobility. Pt presents with steady gait.  Follow Up Recommendations  No PT follow up     Equipment Recommendations  None recommended by PT    Recommendations for Other Services       Precautions / Restrictions Precautions Precautions: Other (comment) Precaution Comments: Monitor O2 sats    Mobility  Bed Mobility Overal bed mobility: Independent                Transfers   Equipment used: None   Sit to Stand: Independent            Ambulation/Gait Ambulation/Gait assistance: Supervision Ambulation Distance (Feet): 450 Feet Assistive device: None     Gait velocity interpretation: at or above normal speed for age/gender General Gait Details: O2 sats between 83 and 89% during ambulation on 3L O2. Pt reports no dizziness or lightheadedness.   Stairs            Wheelchair Mobility    Modified Rankin (Stroke Patients Only)       Balance                                    Cognition Arousal/Alertness: Awake/alert Behavior During Therapy: WFL for tasks assessed/performed Overall Cognitive Status: Within Functional Limits for tasks assessed                      Exercises      General Comments        Pertinent Vitals/Pain Pain Assessment: No/denies pain    Home Living                      Prior Function            PT Goals (current goals can now be found in the care plan section) Acute Rehab PT Goals Patient Stated Goal: To get back to work PT Goal Formulation: With patient Time For Goal Achievement: 09/23/15 Potential to  Achieve Goals: Good Progress towards PT goals: Progressing toward goals    Frequency  Min 3X/week    PT Plan Current plan remains appropriate    Co-evaluation             End of Session   Activity Tolerance: Patient tolerated treatment well Patient left: in chair;with call bell/phone within reach     Time: 0906-0922 PT Time Calculation (min) (ACUTE ONLY): 16 min  Charges:  $Gait Training: 8-22 mins                    G Codes:      Ilda FoilGarrow, Castiel Lauricella Rene 09/17/2015, 9:25 AM

## 2015-09-17 NOTE — Progress Notes (Signed)
SATURATION QUALIFICATIONS: (This note is used to comply with regulatory documentation for home oxygen)  Patient Saturations on Room Air at Rest = 82%  Patient Saturations on 2L of oxygen at rest = 92%  Please briefly explain why patient needs home oxygen: Hypoxia

## 2015-10-08 ENCOUNTER — Encounter: Payer: Self-pay | Admitting: Family Medicine

## 2015-10-08 ENCOUNTER — Ambulatory Visit (INDEPENDENT_AMBULATORY_CARE_PROVIDER_SITE_OTHER): Payer: Self-pay | Admitting: Family Medicine

## 2015-10-08 VITALS — BP 123/68 | HR 80 | Temp 98.6°F | Resp 16 | Ht 67.0 in | Wt 278.0 lb

## 2015-10-08 DIAGNOSIS — Z7189 Other specified counseling: Secondary | ICD-10-CM

## 2015-10-08 DIAGNOSIS — Z23 Encounter for immunization: Secondary | ICD-10-CM

## 2015-10-08 DIAGNOSIS — E139 Other specified diabetes mellitus without complications: Secondary | ICD-10-CM

## 2015-10-08 DIAGNOSIS — Z7689 Persons encountering health services in other specified circumstances: Secondary | ICD-10-CM

## 2015-10-08 MED ORDER — METFORMIN HCL 500 MG PO TABS: 500.0000 mg | ORAL_TABLET | Freq: Two times a day (BID) | ORAL | Status: DC

## 2015-10-08 NOTE — Patient Instructions (Signed)
Take metformin 500 mg twice a day. Avoid concentrated sweets and other carbohydrates like rice, potatoes, pasta, bread (most white foods)  Avoid  Too much fats and cholesterol. Eat lean meat that is baked, boiled, broiled or grilled Eat a lot of fresh vegetables and fruits. Exercise 150 minutes a week. Come back for a recheck in 3 months

## 2015-10-08 NOTE — Progress Notes (Signed)
Patient ID: Steven Dudley, male   DOB: May 15, 1976, 39 y.o.   MRN: [id number]   Steven Dudley, is a 39 y.o. male  WUJ:[id number]  [id number]  DOB - 1976-09-26  CC:  Chief Complaint  Patient presents with  . Establish Care    follow up for COPD        HPI: Steven Dudley is a 39 y.o. male here to establish care. He was recently, earlier this month in the hospital for an exacerbation of his COPD and was found to have elevated blood sugar and an A1C of 6.6.  He was referred here for further care. In addition to COPD, he has a history of GERD, obesity, anxiety, traumatic brain injury, hypertension, tobacco abuse. His medications have been reviewed and are listed in his mediction list. He reports he is taking his BP medication regularly. He has had a flu shot but needs a Tdap. He currently does not need refills on his medications.  Allergies  Allergen Reactions  . Acetaminophen Other (See Comments)    GI BLEEDING  . Shellfish Allergy Nausea And Vomiting, Nausea Only and Other (See Comments)    Headache, heat flashes.   Past Medical History  Diagnosis Date  . Gastritis   . Acid reflux   . Anxiety   . Obesity   . History of traumatic brain injury 2006  . Hypertension     states under control with med., has been on med. x 1 yr.  . Asthma     daily and prn inhalers  . Chondromalacia of right patella 09/2014  . Meniscal cyst 09/2014    right knee  . Smokers' cough (HCC)   . COPD (chronic obstructive pulmonary disease) (HCC)   . Acute respiratory failure (HCC)   . Hyperglycemia    Current Outpatient Prescriptions on File Prior to Visit  Medication Sig Dispense Refill  . albuterol (PROVENTIL HFA;VENTOLIN HFA) 108 (90 BASE) MCG/ACT inhaler Inhale 2 puffs into the lungs every 4 (four) hours as needed for wheezing or shortness of breath. 1 each 0  . busPIRone (BUSPAR) 10 MG tablet Take 1 tablet (10 mg total) by mouth 2 (two) times daily. 60 tablet 1  . cyclobenzaprine  (FLEXERIL) 10 MG tablet Take 10 mg by mouth 3 (three) times daily as needed for muscle spasms.    Marland Kitchen losartan (COZAAR) 100 MG tablet Take 100 mg by mouth daily.    . mometasone-formoterol (DULERA) 100-5 MCG/ACT AERO Inhale 2 puffs into the lungs 2 (two) times daily. 1 Inhaler 0  . nicotine (NICODERM CQ - DOSED IN MG/24 HOURS) 21 mg/24hr patch Place 1 patch (21 mg total) onto the skin daily. 28 patch 0  . ranitidine (ZANTAC) 150 MG tablet Take 150 mg by mouth daily.     . benzonatate (TESSALON) 200 MG capsule Take 1 capsule (200 mg total) by mouth 3 (three) times daily as needed for cough. (Patient not taking: Reported on 10/08/2015) 20 capsule 0  . levofloxacin (LEVAQUIN) 750 MG tablet Take 1 tablet (750 mg total) by mouth daily. (Patient not taking: Reported on 10/08/2015) 2 tablet 0  . oxyCODONE (ROXICODONE) 5 MG immediate release tablet Take 1 tablet (5 mg total) by mouth every 4 (four) hours as needed for severe pain. (Patient not taking: Reported on 10/08/2015) 12 tablet 0  . Phenyleph-Doxylamine-DM-APAP (TYLENOL COLD MULTI-SYMPTOM) 5-6.25-10-325 MG/15ML LIQD Take 30 mLs by mouth daily as needed (for cold). Reported on 10/08/2015    . predniSONE (DELTASONE) 10 MG tablet 4  tablets po daily for 2 days then decrease by 1 tab po q 2 days until off (Patient not taking: Reported on 10/08/2015) 20 tablet 0  . promethazine (PHENERGAN) 25 MG tablet Take 1 tablet (25 mg total) by mouth every 8 (eight) hours as needed for nausea or vomiting. (Patient not taking: Reported on 10/08/2015) 15 tablet 0  . Pseudoeph-Doxylamine-DM-APAP (DAYQUIL/NYQUIL COLD/FLU RELIEF PO) Take 2 capsules by mouth daily as needed (for cold). Reported on 10/08/2015    . sucralfate (CARAFATE) 1 GM/10ML suspension Take 10 mLs (1 g total) by mouth 4 (four) times daily -  with meals and at bedtime. (Patient not taking: Reported on 10/08/2015) 420 mL 0   No current facility-administered medications on file prior to visit.   History  reviewed. No pertinent family history. Social History   Social History  . Marital Status: Married    Spouse Name: N/A  . Number of Children: N/A  . Years of Education: N/A   Occupational History  . Not on file.   Social History Main Topics  . Smoking status: Current Every Day Smoker -- 1.50 packs/day for 26 years    Types: Cigarettes  . Smokeless tobacco: Former Neurosurgeon  . Alcohol Use: 0.0 oz/week    0 Standard drinks or equivalent per week     Comment: 2 x/week  . Drug Use: No  . Sexual Activity: Not on file   Other Topics Concern  . Not on file   Social History Narrative    Review of Systems: Constitutional: Negative for fever, chills, appetite change, weight loss,  Fatigue. Skin: Negative for rashes or lesions of concern. HENT: Negative for ear pain, ear discharge.nose bleeds Eyes: Negative for pain, discharge, redness, itching and visual disturbance. Neck: Negative for pain, stiffness Respiratory: Positive  for cough, shortness of breath,   Cardiovascular: Negative for chest pain, palpitations and leg swelling. Gastrointestinal: Negative for abdominal pain, nausea, vomiting, diarrhea, constipations. Positive for heartburn Genitourinary: Negative for dysuria, urgency, frequency, hematuria,  Musculoskeletal: Negative for back pain, joint pain, joint  swelling, and gait problem.Negative for weakness. Neurological: Negative for dizziness, tremors, seizures, syncope,   light-headedness, numbness and headaches.  Hematological: Negative for easy bruising or bleeding Psychiatric/Behavioral: Negative for depression.Positive for anxiety   Objective:   Filed Vitals:   10/08/15 1357  BP: 123/68  Pulse: 80  Temp: 98.6 F (37 C)  Resp: 16    Physical Exam: Constitutional: Patient appears well-developed and well-nourished. No distress. HENT: Normocephalic, atraumatic, External right and left ear normal. Oropharynx is clear and moist.  Eyes: Conjunctivae and EOM are normal.  PERRLA, no scleral icterus. Neck: Normal ROM. Neck supple. No lymphadenopathy, No thyromegaly. CVS: RRR, S1/S2 +, no murmurs, no gallops, no rubs Pulmonary: Effort and breath sounds normal, no stridor, rhonchi, wheezes, rales.  Abdominal: Soft. Normoactive BS,, no distension, tenderness, rebound or guarding.  Musculoskeletal: Normal range of motion. No edema and no tenderness.  Neuro: Alert.Normal muscle tone coordination. Non-focal Skin: Skin is warm and dry. No rash noted. Not diaphoretic. No erythema. No pallor. Psychiatric: Normal mood and affect. Behavior, judgment, thought content normal.  Lab Results  Component Value Date   WBC 10.9* 09/15/2015   HGB 16.9 09/15/2015   HCT 53.1* 09/15/2015   MCV 98.3 09/15/2015   PLT 231 09/15/2015   Lab Results  Component Value Date   CREATININE 0.96 09/16/2015   BUN 19 09/16/2015   NA 137 09/16/2015   K 4.7 09/16/2015   CL 93* 09/16/2015  CO2 38* 09/16/2015    Lab Results  Component Value Date   HGBA1C 6.6* 09/14/2015   Lipid Panel  No results found for: CHOL, TRIG, HDL, CHOLHDL, VLDL, LDLCALC     Assessment and plan:   1. Diabetes mellitus of other type without complication (HCC) - metFORMIN (GLUCOPHAGE) 500 MG tablet; Take 1 tablet (500 mg total) by mouth 2 (two) times daily with a meal.  Dispense: 180 tablet; Refill: 3   Follow-up in 3 months.  The patient was given clear instructions to go to ER or return to medical center if symptoms don't improve, worsen or new problems develop. The patient verbalized understanding.    Henrietta HooverLinda C Zaccary Creech FNP  10/08/2015, 2:53 PM

## 2016-01-04 ENCOUNTER — Encounter: Payer: Self-pay | Admitting: Family Medicine

## 2016-01-04 ENCOUNTER — Ambulatory Visit (INDEPENDENT_AMBULATORY_CARE_PROVIDER_SITE_OTHER): Payer: Self-pay | Admitting: Family Medicine

## 2016-01-04 VITALS — BP 136/75 | HR 85 | Temp 98.6°F | Ht 67.0 in | Wt 278.0 lb

## 2016-01-04 DIAGNOSIS — E139 Other specified diabetes mellitus without complications: Secondary | ICD-10-CM

## 2016-01-04 DIAGNOSIS — E138 Other specified diabetes mellitus with unspecified complications: Secondary | ICD-10-CM

## 2016-01-05 LAB — LIPID PANEL
CHOL/HDL RATIO: 4.3 ratio (ref <=5.0)
Cholesterol: 147 mg/dL (ref 125–200)
HDL: 34 mg/dL — ABNORMAL LOW (ref >=40)
LDL Cholesterol: 76 mg/dL (ref <130)
Triglycerides: 184 mg/dL — ABNORMAL HIGH (ref <150)
VLDL: 37 mg/dL — ABNORMAL HIGH (ref <30)

## 2016-01-05 LAB — HEMOGLOBIN A1C
Hgb A1c MFr Bld: 6.4 % — ABNORMAL HIGH (ref <5.7)
MEAN PLASMA GLUCOSE: 137 mg/dL

## 2016-01-11 DIAGNOSIS — E119 Type 2 diabetes mellitus without complications: Secondary | ICD-10-CM | POA: Insufficient documentation

## 2016-01-11 NOTE — Progress Notes (Signed)
Patient ID: Steven Dudley, male   DOB: 10/30/1975, 40 y.o.   MRN: [id number]   Steven Dudley, is a 41 y.o. male  [id number]  [id number]  DOB - 04/30/1976  CC:  Chief Complaint  Patient presents with  . follow up    has not started metformin, says glucose is normal in the morings, complaints of new cough, wheezing, thinks he is going to get a chest cold, complaints of increased fatigue, complaints of bottom of feet hurting and some dizziness       HPI: Steven Dudley is a 40 y.o. male here to follow-up diabetes and hypertension. When he was last seen 3 months ago, I prescribed Metformin 500 bid for BS of 160 and A1C of 6.6. He was already on Losartyan 100 of hypertension. He did not start the metformin. He states he is eating better and he doesn't think he needs it. He is not exercising. He has COPD and reports he only uses his albuterol 1-2 times a month. He reports he thinks he is getting a cold. He complains of fatigue, feet hurting and occassional dizziness.  Allergies  Allergen Reactions  . Acetaminophen Other (See Comments)    GI BLEEDING  . Shellfish Allergy Nausea And Vomiting, Nausea Only and Other (See Comments)    Headache, heat flashes.   Past Medical History  Diagnosis Date  . Gastritis   . Acid reflux   . Anxiety   . Obesity   . History of traumatic brain injury 2006  . Hypertension     states under control with med., has been on med. x 1 yr.  . Asthma     daily and prn inhalers  . Chondromalacia of right patella 09/2014  . Meniscal cyst 09/2014    right knee  . Smokers' cough (HCC)   . COPD (chronic obstructive pulmonary disease) (HCC)   . Acute respiratory failure (HCC)   . Hyperglycemia    Current Outpatient Prescriptions on File Prior to Visit  Medication Sig Dispense Refill  . losartan (COZAAR) 100 MG tablet Take 100 mg by mouth daily.    Marland Kitchen albuterol (PROVENTIL HFA;VENTOLIN HFA) 108 (90 BASE) MCG/ACT inhaler Inhale 2 puffs into the lungs  every 4 (four) hours as needed for wheezing or shortness of breath. (Patient not taking: Reported on 01/04/2016) 1 each 0  . busPIRone (BUSPAR) 10 MG tablet Take 1 tablet (10 mg total) by mouth 2 (two) times daily. (Patient not taking: Reported on 01/04/2016) 60 tablet 1  . cyclobenzaprine (FLEXERIL) 10 MG tablet Take 10 mg by mouth 3 (three) times daily as needed for muscle spasms. Reported on 01/04/2016    . metFORMIN (GLUCOPHAGE) 500 MG tablet Take 1 tablet (500 mg total) by mouth 2 (two) times daily with a meal. (Patient not taking: Reported on 01/04/2016) 180 tablet 3  . mometasone-formoterol (DULERA) 100-5 MCG/ACT AERO Inhale 2 puffs into the lungs 2 (two) times daily. (Patient not taking: Reported on 01/04/2016) 1 Inhaler 0  . nicotine (NICODERM CQ - DOSED IN MG/24 HOURS) 21 mg/24hr patch Place 1 patch (21 mg total) onto the skin daily. (Patient not taking: Reported on 01/04/2016) 28 patch 0  . Pseudoeph-Doxylamine-DM-APAP (DAYQUIL/NYQUIL COLD/FLU RELIEF PO) Take 2 capsules by mouth daily as needed (for cold). Reported on 01/04/2016    . ranitidine (ZANTAC) 150 MG tablet Take 150 mg by mouth daily. Reported on 01/04/2016    . sucralfate (CARAFATE) 1 GM/10ML suspension Take 10 mLs (1 g total) by mouth 4 (four)  times daily -  with meals and at bedtime. (Patient not taking: Reported on 10/08/2015) 420 mL 0   No current facility-administered medications on file prior to visit.   History reviewed. No pertinent family history. Social History   Social History  . Marital Status: Married    Spouse Name: N/A  . Number of Children: N/A  . Years of Education: N/A   Occupational History  . Not on file.   Social History Main Topics  . Smoking status: Former Smoker -- 1.50 packs/day for 26 years    Types: Cigarettes  . Smokeless tobacco: Former Neurosurgeon  . Alcohol Use: 0.0 oz/week    0 Standard drinks or equivalent per week     Comment: 2 x/week  . Drug Use: No  . Sexual Activity: Not on file   Other  Topics Concern  . Not on file   Social History Narrative    Review of Systems: Constitutional: Negative for fever, chills, appetite change, weight loss. Positive for fatigue Skin: Negative for rashes or lesions of concern. HENT: Negative for ear pain, ear discharge.nose bleeds Eyes: Negative for pain, discharge, redness, itching and visual disturbance. Neck: Negative for pain, stiffness Respiratory: Positive at this time  For minor cough and occassional wheezing shortness of breath,   Cardiovascular: Negative for chest pain, palpitations and leg swelling. Gastrointestinal: Negative for abdominal pain, nausea, vomiting, diarrhea, constipations Genitourinary: Negative for dysuria, urgency, frequency, hematuria,  Musculoskeletal: Negative for back pain, joint pain, joint  swelling, and gait problem.Negative for weakness. Neurological: Negative for  tremors, seizures, syncope,   light-headedness, numbness and headaches. Positive for occ dizziness Hematological: Negative for easy bruising or bleeding Psychiatric/Behavioral: Negative for depression, anxiety, decreased concentration, confusion   Objective:   Filed Vitals:   01/04/16 1546  BP: 136/75  Pulse: 85  Temp: 98.6 F (37 C)    Physical Exam: Constitutional: Patient appears well-developed and well-nourished. No distress. HENT: Normocephalic, atraumatic, External right and left ear normal. Oropharynx is clear and moist.  Eyes: Conjunctivae and EOM are normal. PERRLA, no scleral icterus. Neck: Normal ROM. Neck supple. No lymphadenopathy, No thyromegaly. CVS: RRR, S1/S2 +, no murmurs, no gallops, no rubs Pulmonary: Effort and breath sounds normal, no stridor, rhonchi, wheezes, rales.  Abdominal: Soft. Normoactive BS,, no distension, tenderness, rebound or guarding.  Musculoskeletal: Normal range of motion. No edema and no tenderness.  Neuro: Alert.Normal muscle tone coordination. Non-focal Skin: Skin is warm and dry. No rash  noted. Not diaphoretic. No erythema. No pallor. Psychiatric: Normal mood and affect. Behavior, judgment, thought content normal. Foot exam normal  Lab Results  Component Value Date   WBC 10.9* 09/15/2015   HGB 16.9 09/15/2015   HCT 53.1* 09/15/2015   MCV 98.3 09/15/2015   PLT 231 09/15/2015   Lab Results  Component Value Date   CREATININE 0.96 09/16/2015   BUN 19 09/16/2015   NA 137 09/16/2015   K 4.7 09/16/2015   CL 93* 09/16/2015   CO2 38* 09/16/2015    Lab Results  Component Value Date   HGBA1C 6.4* 01/04/2016   Lipid Panel     Component Value Date/Time   CHOL 147 01/04/2016 1612   TRIG 184* 01/04/2016 1612   HDL 34* 01/04/2016 1612   CHOLHDL 4.3 01/04/2016 1612   VLDL 37* 01/04/2016 1612   LDLCALC 76 01/04/2016 1612       Assessment and plan:   1. Diabetes without complications, well controlled without medication.  - Hemoglobin A1c - Lipid panel  -  Have explained to paitient that if his A1C is in good control, he can continue to try to manage his diabetes with diet and exercise with a recheck every 3 months.   3 months.  The patient was given clear instructions to go to ER or return to medical center if symptoms don't improve, worsen or new problems develop. The patient verbalized understanding.    Henrietta HooverLinda C Bernhardt FNP  01/11/2016, 7:53 AM

## 2016-01-22 ENCOUNTER — Emergency Department (HOSPITAL_BASED_OUTPATIENT_CLINIC_OR_DEPARTMENT_OTHER)
Admission: EM | Admit: 2016-01-22 | Discharge: 2016-01-22 | Disposition: A | Discharge status: Home / Self Care | Payer: MEDICAID | Attending: Emergency Medicine | Admitting: Emergency Medicine

## 2016-01-22 ENCOUNTER — Encounter (HOSPITAL_BASED_OUTPATIENT_CLINIC_OR_DEPARTMENT_OTHER): Payer: Self-pay | Admitting: Emergency Medicine

## 2016-01-22 DIAGNOSIS — Z79899 Other long term (current) drug therapy: Secondary | ICD-10-CM | POA: Insufficient documentation

## 2016-01-22 DIAGNOSIS — Z7951 Long term (current) use of inhaled steroids: Secondary | ICD-10-CM | POA: Insufficient documentation

## 2016-01-22 DIAGNOSIS — Z87820 Personal history of traumatic brain injury: Secondary | ICD-10-CM | POA: Insufficient documentation

## 2016-01-22 DIAGNOSIS — K2920 Alcoholic gastritis without bleeding: Secondary | ICD-10-CM | POA: Insufficient documentation

## 2016-01-22 DIAGNOSIS — F419 Anxiety disorder, unspecified: Secondary | ICD-10-CM | POA: Insufficient documentation

## 2016-01-22 DIAGNOSIS — Z87891 Personal history of nicotine dependence: Secondary | ICD-10-CM | POA: Insufficient documentation

## 2016-01-22 DIAGNOSIS — E669 Obesity, unspecified: Secondary | ICD-10-CM | POA: Insufficient documentation

## 2016-01-22 DIAGNOSIS — Z8739 Personal history of other diseases of the musculoskeletal system and connective tissue: Secondary | ICD-10-CM | POA: Insufficient documentation

## 2016-01-22 DIAGNOSIS — K219 Gastro-esophageal reflux disease without esophagitis: Secondary | ICD-10-CM | POA: Insufficient documentation

## 2016-01-22 DIAGNOSIS — J449 Chronic obstructive pulmonary disease, unspecified: Secondary | ICD-10-CM | POA: Insufficient documentation

## 2016-01-22 DIAGNOSIS — F1010 Alcohol abuse, uncomplicated: Secondary | ICD-10-CM | POA: Insufficient documentation

## 2016-01-22 DIAGNOSIS — I10 Essential (primary) hypertension: Secondary | ICD-10-CM | POA: Insufficient documentation

## 2016-01-22 LAB — CBC
HCT: 47.4 % (ref 39.0–52.0)
Hemoglobin: 16.1 g/dL (ref 13.0–17.0)
MCH: 30 pg (ref 26.0–34.0)
MCHC: 34 g/dL (ref 30.0–36.0)
MCV: 88.4 fL (ref 78.0–100.0)
PLATELETS: 303 10*3/uL (ref 150–400)
RBC: 5.36 MIL/uL (ref 4.22–5.81)
RDW: 12.4 % (ref 11.5–15.5)
WBC: 14.8 10*3/uL — AB (ref 4.0–10.5)

## 2016-01-22 LAB — COMPREHENSIVE METABOLIC PANEL
ALK PHOS: 65 U/L (ref 38–126)
ALT: 18 U/L (ref 17–63)
ANION GAP: 9 (ref 5–15)
AST: 17 U/L (ref 15–41)
Albumin: 4.2 g/dL (ref 3.5–5.0)
BILIRUBIN TOTAL: 0.8 mg/dL (ref 0.3–1.2)
BUN: 11 mg/dL (ref 6–20)
CHLORIDE: 99 mmol/L — AB (ref 101–111)
CO2: 30 mmol/L (ref 22–32)
CREATININE: 0.86 mg/dL (ref 0.61–1.24)
Calcium: 9.3 mg/dL (ref 8.9–10.3)
GFR calc Af Amer: >60 mL/min (ref >60)
GFR calc non Af Amer: >60 mL/min (ref >60)
Glucose, Bld: 168 mg/dL — ABNORMAL HIGH (ref 65–99)
POTASSIUM: 3.6 mmol/L (ref 3.5–5.1)
Sodium: 138 mmol/L (ref 135–145)
TOTAL PROTEIN: 8 g/dL (ref 6.5–8.1)

## 2016-01-22 LAB — LIPASE, BLOOD: Lipase: 18 U/L (ref 11–51)

## 2016-01-22 MED ORDER — GI COCKTAIL ~~LOC~~
30.0000 mL | Freq: Once | ORAL | Status: AC
  Administered 2016-01-22: 30 mL via ORAL
  Filled 2016-01-22: qty 30

## 2016-01-22 MED ORDER — ONDANSETRON HCL 4 MG/2ML IJ SOLN
4.0000 mg | Freq: Once | INTRAMUSCULAR | Status: AC
  Administered 2016-01-22: 4 mg via INTRAVENOUS
  Filled 2016-01-22: qty 2

## 2016-01-22 MED ORDER — ONDANSETRON HCL 4 MG/2ML IJ SOLN
4.0000 mg | Freq: Once | INTRAMUSCULAR | Status: AC | PRN
  Administered 2016-01-22: 4 mg via INTRAVENOUS
  Filled 2016-01-22: qty 2

## 2016-01-22 MED ORDER — SUCRALFATE 1 GM/10ML PO SUSP: 1.0000 g | Freq: Three times a day (TID) | ORAL | Status: DC

## 2016-01-22 MED ORDER — PANTOPRAZOLE SODIUM 40 MG IV SOLR
40.0000 mg | Freq: Once | INTRAVENOUS | Status: AC
  Administered 2016-01-22: 40 mg via INTRAVENOUS
  Filled 2016-01-22: qty 40

## 2016-01-22 MED ORDER — SODIUM CHLORIDE 0.9 % IV BOLUS (SEPSIS)
1000.0000 mL | Freq: Once | INTRAVENOUS | Status: AC
  Administered 2016-01-22: 1000 mL via INTRAVENOUS

## 2016-01-22 MED ORDER — OMEPRAZOLE 20 MG PO CPDR: 20.0000 mg | DELAYED_RELEASE_CAPSULE | Freq: Every day | ORAL | Status: DC

## 2016-01-22 MED ORDER — PROMETHAZINE HCL 25 MG PO TABS: 25.0000 mg | ORAL_TABLET | Freq: Four times a day (QID) | ORAL | Status: DC | PRN

## 2016-01-22 MED ORDER — HYDROMORPHONE HCL 1 MG/ML IJ SOLN
1.0000 mg | Freq: Once | INTRAMUSCULAR | Status: AC
  Administered 2016-01-22: 1 mg via INTRAVENOUS
  Filled 2016-01-22: qty 1

## 2016-01-22 MED ORDER — LORAZEPAM 2 MG/ML IJ SOLN
1.0000 mg | Freq: Once | INTRAMUSCULAR | Status: AC
  Administered 2016-01-22: 1 mg via INTRAVENOUS
  Filled 2016-01-22: qty 1

## 2016-01-22 MED FILL — PROMETHAZINE 25 MG TABLET: 25 | 3 days supply | Qty: 10 | Fill #0

## 2016-01-22 MED FILL — OMEPRAZOLE DR 20 MG CAPSULE: 20 | 30 days supply | Qty: 30 | Fill #0

## 2016-01-22 MED FILL — CARAFATE 1 GM/10 ML SUSP: 1 | 11 days supply | Qty: 420 | Fill #0

## 2016-01-22 NOTE — Discharge Instructions (Signed)
Gastritis, Adult Gastritis is soreness and swelling (inflammation) of the lining of the stomach. Gastritis can develop as a sudden onset (acute) or long-term (chronic) condition. If gastritis is not treated, it can lead to stomach bleeding and ulcers. CAUSES  Gastritis occurs when the stomach lining is weak or damaged. Digestive juices from the stomach then inflame the weakened stomach lining. The stomach lining may be weak or damaged due to viral or bacterial infections. One common bacterial infection is the Helicobacter pylori infection. Gastritis can also result from excessive alcohol consumption, taking certain medicines, or having too much acid in the stomach.  SYMPTOMS  In some cases, there are no symptoms. When symptoms are present, they may include:  Pain or a burning sensation in the upper abdomen.  Nausea.  Vomiting.  An uncomfortable feeling of fullness after eating. DIAGNOSIS  Your caregiver may suspect you have gastritis based on your symptoms and a physical exam. To determine the cause of your gastritis, your caregiver may perform the following:  Blood or stool tests to check for the H pylori bacterium.  Gastroscopy. A thin, flexible tube (endoscope) is passed down the esophagus and into the stomach. The endoscope has a light and camera on the end. Your caregiver uses the endoscope to view the inside of the stomach.  Taking a tissue sample (biopsy) from the stomach to examine under a microscope. TREATMENT  Depending on the cause of your gastritis, medicines may be prescribed. If you have a bacterial infection, such as an H pylori infection, antibiotics may be given. If your gastritis is caused by too much acid in the stomach, H2 blockers or antacids may be given. Your caregiver may recommend that you stop taking aspirin, ibuprofen, or other nonsteroidal anti-inflammatory drugs (NSAIDs). HOME CARE INSTRUCTIONS  Only take over-the-counter or prescription medicines as directed by  your caregiver.  If you were given antibiotic medicines, take them as directed. Finish them even if you start to feel better.  Drink enough fluids to keep your urine clear or pale yellow.  Avoid foods and drinks that make your symptoms worse, such as:  Caffeine or alcoholic drinks.  Chocolate.  Peppermint or mint flavorings.  Garlic and onions.  Spicy foods.  Citrus fruits, such as oranges, lemons, or limes.  Tomato-based foods such as sauce, chili, salsa, and pizza.  Fried and fatty foods.  Eat small, frequent meals instead of large meals. SEEK IMMEDIATE MEDICAL CARE IF:   You have black or dark red stools.  You vomit blood or material that looks like coffee grounds.  You are unable to keep fluids down.  Your abdominal pain gets worse.  You have a fever.  You do not feel better after 1 week.  You have any other questions or concerns. MAKE SURE YOU:  Understand these instructions.  Will watch your condition.  Will get help right away if you are not doing well or get worse.   This information is not intended to replace advice given to you by your health care provider. Make sure you discuss any questions you have with your health care provider.   Document Released: 09/20/2001 Document Revised: 03/27/2012 Document Reviewed: 11/09/2011 Elsevier Interactive Patient Education 2016 [employer]. Alcohol Use Disorder Alcohol use disorder is a mental disorder. It is not a one-time incident of heavy drinking. Alcohol use disorder is the excessive and uncontrollable use of alcohol over time that leads to problems with functioning in one or more areas of daily living. People with this disorder  risk harming themselves and others when they drink to excess. Alcohol use disorder also can cause other mental disorders, such as mood and anxiety disorders, and serious physical problems. People with alcohol use disorder often misuse other drugs.  Alcohol use disorder is common and  widespread. Some people with this disorder drink alcohol to cope with or escape from negative life events. Others drink to relieve chronic pain or symptoms of mental illness. People with a family history of alcohol use disorder are at higher risk of losing control and using alcohol to excess.  Drinking too much alcohol can cause injury, accidents, and health problems. One drink can be too much when you are:  Working.  Pregnant or breastfeeding.  Taking medicines. Ask your doctor.  Driving or planning to drive. SYMPTOMS  Signs and symptoms of alcohol use disorder may include the following:   Consumption ofalcohol inlarger amounts or over a longer period of time than intended.  Multiple unsuccessful attempts to cutdown or control alcohol use.   A great deal of time spent obtaining alcohol, using alcohol, or recovering from the effects of alcohol (hangover).  A strong desire or urge to use alcohol (cravings).   Continued use of alcohol despite problems at work, school, or home because of alcohol use.   Continued use of alcohol despite problems in relationships because of alcohol use.  Continued use of alcohol in situations when it is physically hazardous, such as driving a car.  Continued use of alcohol despite awareness of a physical or psychological problem that is likely related to alcohol use. Physical problems related to alcohol use can involve the brain, heart, liver, stomach, and intestines. Psychological problems related to alcohol use include intoxication, depression, anxiety, psychosis, delirium, and dementia.   The need for increased amounts of alcohol to achieve the same desired effect, or a decreased effect from the consumption of the same amount of alcohol (tolerance).  Withdrawal symptoms upon reducing or stopping alcohol use, or alcohol use to reduce or avoid withdrawal symptoms. Withdrawal symptoms include:  Racing heart.  Hand tremor.  Difficulty  sleeping.  Nausea.  Vomiting.  Hallucinations.  Restlessness.  Seizures. DIAGNOSIS Alcohol use disorder is diagnosed through an assessment by your health care provider. Your health care provider may start by asking three or four questions to screen for excessive or problematic alcohol use. To confirm a diagnosis of alcohol use disorder, at least two symptoms must be present within a 25-month period. The severity of alcohol use disorder depends on the number of symptoms:  Mild--two or three.  Moderate--four or five.  Severe--six or more. Your health care provider may perform a physical exam or use results from lab tests to see if you have physical problems resulting from alcohol use. Your health care provider may refer you to a mental health professional for evaluation. TREATMENT  Some people with alcohol use disorder are able to reduce their alcohol use to low-risk levels. Some people with alcohol use disorder need to quit drinking alcohol. When necessary, mental health professionals with specialized training in substance use treatment can help. Your health care provider can help you decide how severe your alcohol use disorder is and what type of treatment you need. The following forms of treatment are available:   Detoxification. Detoxification involves the use of prescription medicines to prevent alcohol withdrawal symptoms in the first week after quitting. This is important for people with a history of symptoms of withdrawal and for heavy drinkers who are likely to have  withdrawal symptoms. Alcohol withdrawal can be dangerous and, in severe cases, cause death. Detoxification is usually provided in a hospital or in-patient substance use treatment facility.  Counseling or talk therapy. Talk therapy is provided by substance use treatment counselors. It addresses the reasons people use alcohol and ways to keep them from drinking again. The goals of talk therapy are to help people with alcohol  use disorder find healthy activities and ways to cope with life stress, to identify and avoid triggers for alcohol use, and to handle cravings, which can cause relapse.  Medicines.Different medicines can help treat alcohol use disorder through the following actions:  Decrease alcohol cravings.  Decrease the positive reward response felt from alcohol use.  Produce an uncomfortable physical reaction when alcohol is used (aversion therapy).  Support groups. Support groups are run by people who have quit drinking. They provide emotional support, advice, and guidance. These forms of treatment are often combined. Some people with alcohol use disorder benefit from intensive combination treatment provided by specialized substance use treatment centers. Both inpatient and outpatient treatment programs are available.   This information is not intended to replace advice given to you by your health care provider. Make sure you discuss any questions you have with your health care provider.   Document Released: 11/03/2004 Document Revised: 10/17/2014 Document Reviewed: 01/03/2013 Elsevier Interactive Patient Education [employer]2016 [employer] Inc[employer]

## 2016-01-22 NOTE — ED Notes (Signed)
Pt in c/o n/v onset 2 days ago, hx of gastritis.

## 2016-01-22 NOTE — ED Provider Notes (Signed)
CSN: [id number]     Arrival date & time 01/22/16  1045 History   First MD Initiated Contact with Patient 01/22/16 1111     Chief Complaint  Patient presents with  . Nausea  . Emesis     The history is provided by the patient. No language interpreter was used.   Steven Dudley is a 40 y.o. male who presents to the Emergency Department complaining of N/V.  He reports 2 days of epigastric discomfort with nausea and vomiting. He has a history of gastritis. He states his gastritis symptoms are triggered with increased stress. He also does drink alcohol. He last drank a pint of whiskey on Tuesday. He denies any fevers, diarrhea, constipation, dysuria. His only medication is Zantac. Symptoms are moderate, constant, worsening.  Past Medical History  Diagnosis Date  . Gastritis   . Acid reflux   . Anxiety   . Obesity   . History of traumatic brain injury 2006  . Hypertension     states under control with med., has been on med. x 1 yr.  . Asthma     daily and prn inhalers  . Chondromalacia of right patella 09/2014  . Meniscal cyst 09/2014    right knee  . Smokers' cough (HCC)   . COPD (chronic obstructive pulmonary disease) (HCC)   . Acute respiratory failure (HCC)   . Hyperglycemia    Past Surgical History  Procedure Laterality Date  . Appendectomy    . Tonsillectomy and adenoidectomy    . Irrigation and debridement abscess Left 02/27/2008    hand  . Tenosynovectomy Left 02/27/2008    flexor tenosynovectomy left hand  . Foreign body removal Left 02/27/2008    hand  . Incision and drainage abscess Left 03/01/2008    hand  . Knee arthroscopy with medial menisectomy Right 09/25/2014    Procedure: RIGHT KNEE SCOPE CHONDROPLASTY/MEDIAL MENISCECTOMY;  Surgeon: Loreta Aveaniel F Murphy, MD;  Location: [facility];  Service: Orthopedics;  Laterality: Right;  ANESTHESIA: GENERAL, KNEE BLOCK   History reviewed. No pertinent family history. Social History  Substance Use Topics  .  Smoking status: Former Smoker -- 1.50 packs/day for 26 years    Types: Cigarettes  . Smokeless tobacco: Former NeurosurgeonUser  . Alcohol Use: 0.0 oz/week    0 Standard drinks or equivalent per week     Comment: 2 x/week    Review of Systems  All other systems reviewed and are negative.     Allergies  Acetaminophen and Shellfish allergy  Home Medications   Prior to Admission medications   Medication Sig Start Date End Date Taking? Authorizing Provider  albuterol (PROVENTIL HFA;VENTOLIN HFA) 108 (90 BASE) MCG/ACT inhaler Inhale 2 puffs into the lungs every 4 (four) hours as needed for wheezing or shortness of breath. Patient not taking: Reported on 01/04/2016 09/17/15   Christiane Haorinna L Sullivan, MD  busPIRone (BUSPAR) 10 MG tablet Take 1 tablet (10 mg total) by mouth 2 (two) times daily. Patient not taking: Reported on 01/04/2016 09/17/15   Christiane Haorinna L Sullivan, MD  cyclobenzaprine (FLEXERIL) 10 MG tablet Take 10 mg by mouth 3 (three) times daily as needed for muscle spasms. Reported on 01/04/2016    Historical Provider, MD  losartan (COZAAR) 100 MG tablet Take 100 mg by mouth daily.    Historical Provider, MD  metFORMIN (GLUCOPHAGE) 500 MG tablet Take 1 tablet (500 mg total) by mouth 2 (two) times daily with a meal. Patient not taking: Reported on 01/04/2016 10/08/15  Henrietta Hoover, NP  mometasone-formoterol (DULERA) 100-5 MCG/ACT AERO Inhale 2 puffs into the lungs 2 (two) times daily. Patient not taking: Reported on 01/04/2016 09/17/15   Christiane Ha, MD  nicotine (NICODERM CQ - DOSED IN MG/24 HOURS) 21 mg/24hr patch Place 1 patch (21 mg total) onto the skin daily. Patient not taking: Reported on 01/04/2016 09/17/15   Christiane Ha, MD  omeprazole (PRILOSEC) 20 MG capsule Take 1 capsule (20 mg total) by mouth daily. 01/22/16   Tilden Fossa, MD  promethazine (PHENERGAN) 25 MG tablet Take 1 tablet (25 mg total) by mouth every 6 (six) hours as needed for nausea or vomiting. 01/22/16   Tilden Fossa, MD  Pseudoeph-Doxylamine-DM-APAP (DAYQUIL/NYQUIL COLD/FLU RELIEF PO) Take 2 capsules by mouth daily as needed (for cold). Reported on 01/04/2016    Historical Provider, MD  ranitidine (ZANTAC) 150 MG tablet Take 150 mg by mouth daily. Reported on 01/04/2016    Historical Provider, MD  sucralfate (CARAFATE) 1 GM/10ML suspension Take 10 mLs (1 g total) by mouth 4 (four) times daily -  with meals and at bedtime. 01/22/16   Tilden Fossa, MD   BP 161/108 mmHg  Pulse 78  Temp(Src) 98.4 F (36.9 C) (Oral)  Resp 20  Ht  (1.702 m)  Wt 280 lb (127.007 kg)  BMI 43.84 kg/m2  SpO2 95% Physical Exam  Constitutional: He is oriented to person, place, and time. He appears well-developed and well-nourished.  HENT:  Head: Normocephalic and atraumatic.  Cardiovascular: Normal rate and regular rhythm.   No murmur heard. Pulmonary/Chest: Effort normal and breath sounds normal. No respiratory distress.  Abdominal: Soft. There is no rebound and no guarding.  Obese abdomen with mild abdominal distention. Mild to moderate epigastric tenderness without guarding or rebound.  Musculoskeletal: He exhibits no edema or tenderness.  Neurological: He is alert and oriented to person, place, and time.  Skin: Skin is warm and dry.  Psychiatric: He has a normal mood and affect. His behavior is normal.  Nursing note and vitals reviewed.   ED Course  Procedures (including critical care time) Labs Review Labs Reviewed  COMPREHENSIVE METABOLIC PANEL - Abnormal; Notable for the following:    Chloride 99 (*)    Glucose, Bld 168 (*)    All other components within normal limits  CBC - Abnormal; Notable for the following:    WBC 14.8 (*)    All other components within normal limits  LIPASE, BLOOD    Imaging Review No results found. I have personally reviewed and evaluated these images and lab results as part of my medical decision-making.   EKG Interpretation None      MDM   Final diagnoses:   Acute alcoholic gastritis without hemorrhage    Patient with history of alcohol abuse here with epigastric pain, vomiting. He is nontoxic appearing on examination with no peritoneal findings. Presentation is not consistent with bowel obstruction, perforated peptic ulcer, pancreatitis, cholecystitis. Patient is improved and tolerating orals following pain medications, antiemetics. Discussed home care for alcoholic gastritis with oral fluid hydration, absence from alcohol, PPI, Carafate. Offered patient resources for alcohol rehabilitation and patient declined. Discussed return precautions.    Tilden Fossa, MD 01/23/16 [phone]

## 2016-03-01 ENCOUNTER — Emergency Department (HOSPITAL_BASED_OUTPATIENT_CLINIC_OR_DEPARTMENT_OTHER)
Admission: EM | Admit: 2016-03-01 | Discharge: 2016-03-01 | Disposition: A | Discharge status: Home / Self Care | Payer: Medicaid Other | Attending: Emergency Medicine | Admitting: Emergency Medicine

## 2016-03-01 ENCOUNTER — Encounter (HOSPITAL_BASED_OUTPATIENT_CLINIC_OR_DEPARTMENT_OTHER): Payer: Self-pay | Admitting: Emergency Medicine

## 2016-03-01 DIAGNOSIS — I10 Essential (primary) hypertension: Secondary | ICD-10-CM | POA: Insufficient documentation

## 2016-03-01 DIAGNOSIS — J449 Chronic obstructive pulmonary disease, unspecified: Secondary | ICD-10-CM | POA: Insufficient documentation

## 2016-03-01 DIAGNOSIS — Y9389 Activity, other specified: Secondary | ICD-10-CM | POA: Insufficient documentation

## 2016-03-01 DIAGNOSIS — Z79899 Other long term (current) drug therapy: Secondary | ICD-10-CM | POA: Insufficient documentation

## 2016-03-01 DIAGNOSIS — Z6841 Body Mass Index (BMI) 40.0 and over, adult: Secondary | ICD-10-CM | POA: Insufficient documentation

## 2016-03-01 DIAGNOSIS — Z87891 Personal history of nicotine dependence: Secondary | ICD-10-CM | POA: Insufficient documentation

## 2016-03-01 DIAGNOSIS — Y929 Unspecified place or not applicable: Secondary | ICD-10-CM | POA: Insufficient documentation

## 2016-03-01 DIAGNOSIS — W57XXXA Bitten or stung by nonvenomous insect and other nonvenomous arthropods, initial encounter: Secondary | ICD-10-CM | POA: Insufficient documentation

## 2016-03-01 DIAGNOSIS — Y999 Unspecified external cause status: Secondary | ICD-10-CM | POA: Insufficient documentation

## 2016-03-01 DIAGNOSIS — L03317 Cellulitis of buttock: Secondary | ICD-10-CM | POA: Insufficient documentation

## 2016-03-01 DIAGNOSIS — E669 Obesity, unspecified: Secondary | ICD-10-CM | POA: Insufficient documentation

## 2016-03-01 MED ORDER — TRAMADOL HCL 50 MG PO TABS: 50.0000 mg | ORAL_TABLET | Freq: Four times a day (QID) | ORAL | Status: DC | PRN

## 2016-03-01 MED ORDER — CLINDAMYCIN HCL 150 MG PO CAPS: 300.0000 mg | ORAL_CAPSULE | Freq: Three times a day (TID) | ORAL | Status: AC

## 2016-03-01 NOTE — ED Notes (Signed)
Patient has area to left buttock that is approximately 8inches in diameter.  Area noted to be erythematous and raised.  Patient reports he thinks he was bit by a spider last week on Wed or Thurs and area has progressively become larger.  Patient also now has 2 smaller erythematous areas to back.  Areas are raised and approximately 3 inches in diameter.  One noted to mid back and one to upper back.

## 2016-03-01 NOTE — ED Notes (Signed)
Pt in c/o insect bite to L posterior upper thigh/lower buttock onset last week that is getting worse and more painful. Alert, interactive, in NAD.

## 2016-03-01 NOTE — Discharge Instructions (Signed)

## 2016-03-01 NOTE — ED Provider Notes (Signed)
CSN: [id number]     Arrival date & time 03/01/16  1938 History  By signing my name below, I, Phillis Haggis, attest that this documentation has been prepared under the direction and in the presence of Alvira Monday, MD. Electronically Signed: Phillis Haggis, ED Scribe. 03/01/2016. 9:51 PM.    Chief Complaint  Patient presents with  . Insect Bite   The history is provided by the patient. No language interpreter was used.  HPI Comments: Steven Dudley is a 40 y.o. male with a hx of HTN and COPD who presents to the Emergency Department complaining of a gradually worsening insect bite onset one week ago. He states that he was working outside when he began to itch then noticed a wound to the left buttock. He believes that he was bitten by a spider. He reports a large area of redness and pain underneath the left buttock. He began to notice two red, itchy areas to the middle of the back earlier this week. He has put pressure on the area to no relief. He has not taken anything for pain  Area of buttock is severely painful worse with sitting. He denies drainage from the wound, fever, chills, nausea, or vomiting.   Past Medical History  Diagnosis Date  . Gastritis   . Acid reflux   . Anxiety   . Obesity   . History of traumatic brain injury 2006  . Hypertension     states under control with med., has been on med. x 1 yr.  . Asthma     daily and prn inhalers  . Chondromalacia of right patella 09/2014  . Meniscal cyst 09/2014    right knee  . Smokers' cough (HCC)   . COPD (chronic obstructive pulmonary disease) (HCC)   . Acute respiratory failure (HCC)   . Hyperglycemia    Past Surgical History  Procedure Laterality Date  . Appendectomy    . Tonsillectomy and adenoidectomy    . Irrigation and debridement abscess Left 02/27/2008    hand  . Tenosynovectomy Left 02/27/2008    flexor tenosynovectomy left hand  . Foreign body removal Left 02/27/2008    hand  . Incision and drainage abscess  Left 03/01/2008    hand  . Knee arthroscopy with medial menisectomy Right 09/25/2014    Procedure: RIGHT KNEE SCOPE CHONDROPLASTY/MEDIAL MENISCECTOMY;  Surgeon: Loreta Ave, MD;  [facility];  Service: Orthopedics;  Laterality: Right;  ANESTHESIA: GENERAL, KNEE BLOCK   History reviewed. No pertinent family history. Social History  Substance Use Topics  . Smoking status: Former Smoker -- 1.50 packs/day for 26 years    Types: Cigarettes  . Smokeless tobacco: Former Neurosurgeon  . Alcohol Use: 0.0 oz/week    0 Standard drinks or equivalent per week     Comment: 2 x/week    Review of Systems  Constitutional: Negative for fever and chills.  HENT: Negative for sore throat.   Eyes: Negative for visual disturbance.  Respiratory: Negative for shortness of breath.   Cardiovascular: Negative for chest pain.  Gastrointestinal: Negative for nausea, vomiting and abdominal pain.  Genitourinary: Negative for difficulty urinating.  Musculoskeletal: Negative for back pain and neck stiffness.  Skin: Positive for rash and wound.  Neurological: Negative for syncope and headaches.   Allergies  Acetaminophen and Shellfish allergy  Home Medications   Prior to Admission medications   Medication Sig Start Date End Date Taking? Authorizing Provider  albuterol (PROVENTIL HFA;VENTOLIN HFA) 108 (90 BASE) MCG/ACT inhaler  Inhale 2 puffs into the lungs every 4 (four) hours as needed for wheezing or shortness of breath. Patient not taking: Reported on 01/04/2016 09/17/15   Christiane Ha, MD  busPIRone (BUSPAR) 10 MG tablet Take 1 tablet (10 mg total) by mouth 2 (two) times daily. Patient not taking: Reported on 01/04/2016 09/17/15   Christiane Ha, MD  clindamycin (CLEOCIN) 150 MG capsule Take 2 capsules (300 mg total) by mouth 3 (three) times daily. 03/01/16 03/08/16  Alvira Monday, MD  cyclobenzaprine (FLEXERIL) 10 MG tablet Take 10 mg by mouth 3 (three) times daily as needed for  muscle spasms. Reported on 01/04/2016    Historical Provider, MD  losartan (COZAAR) 100 MG tablet Take 100 mg by mouth daily.    Historical Provider, MD  metFORMIN (GLUCOPHAGE) 500 MG tablet Take 1 tablet (500 mg total) by mouth 2 (two) times daily with a meal. Patient not taking: Reported on 01/04/2016 10/08/15   Henrietta Hoover, NP  mometasone-formoterol (DULERA) 100-5 MCG/ACT AERO Inhale 2 puffs into the lungs 2 (two) times daily. Patient not taking: Reported on 01/04/2016 09/17/15   Christiane Ha, MD  nicotine (NICODERM CQ - DOSED IN MG/24 HOURS) 21 mg/24hr patch Place 1 patch (21 mg total) onto the skin daily. Patient not taking: Reported on 01/04/2016 09/17/15   Christiane Ha, MD  omeprazole (PRILOSEC) 20 MG capsule Take 1 capsule (20 mg total) by mouth daily. 01/22/16   Tilden Fossa, MD  promethazine (PHENERGAN) 25 MG tablet Take 1 tablet (25 mg total) by mouth every 6 (six) hours as needed for nausea or vomiting. 01/22/16   Tilden Fossa, MD  Pseudoeph-Doxylamine-DM-APAP (DAYQUIL/NYQUIL COLD/FLU RELIEF PO) Take 2 capsules by mouth daily as needed (for cold). Reported on 01/04/2016    Historical Provider, MD  ranitidine (ZANTAC) 150 MG tablet Take 150 mg by mouth daily. Reported on 01/04/2016    Historical Provider, MD  sucralfate (CARAFATE) 1 GM/10ML suspension Take 10 mLs (1 g total) by mouth 4 (four) times daily -  with meals and at bedtime. 01/22/16   Tilden Fossa, MD  traMADol (ULTRAM) 50 MG tablet Take 1 tablet (50 mg total) by mouth every 6 (six) hours as needed. 03/01/16   Alvira Monday, MD   BP 154/96 mmHg  Pulse 97  Temp(Src) 99.6 F (37.6 C) (Oral)  Resp 20  Ht  (1.702 m)  Wt 280 lb (127.007 kg)  BMI 43.84 kg/m2  SpO2 97% Physical Exam  Constitutional: He is oriented to person, place, and time. He appears well-developed and well-nourished.  HENT:  Head: Normocephalic and atraumatic.  Eyes: EOM are normal. Pupils are equal, round, and reactive to light.   Neck: Normal range of motion. Neck supple.  Cardiovascular: Normal rate, regular rhythm and normal heart sounds.  Exam reveals no gallop and no friction rub.   No murmur heard. Pulmonary/Chest: Effort normal and breath sounds normal. He has no wheezes.  Abdominal: Soft. There is no tenderness.  Musculoskeletal: Normal range of motion.  Neurological: He is alert and oriented to person, place, and time.  Skin: Skin is warm and dry.  8 cm area of erythema to left buttock with a central area of ulceration and scaling; no palpable fluctuance but an area of induration 4x4 cm in size. 3x2 cm area of erythema over left lateral mid back with scattered macules and erythematous plaques and patches over his back RLE: one 3x3 erythematous patch  Psychiatric: He has a normal mood and affect.  His behavior is normal.  Nursing note and vitals reviewed.   ED Course  Procedures (including critical care time) DIAGNOSTIC STUDIES: Oxygen Saturation is 97% on RA, normal by my interpretation.    COORDINATION OF CARE: 9:45 PM-Discussed treatment plan which includes US of the area and wound care with pt at bedside and pt agreed to plan.    Labs Review Labs Reviewed - No data to display  Imaging Review No results found. I have personally reviewed and evaluated these images and lab results as part of my medical decision-making.   EKG Interpretation None      MDM   Final diagnoses:  Cellulitis of buttock   40yo male with hx of htn, COPD who presents with concern for insect bite.  Pt with erythematous patches over back that are pruritic and painful erythema to the left buttock.  Area without fluctuance, low suspicion for abscess at this time. Area consistent with cellulitis. Other areas may represent insect bites or other allergic reaction. Wrote rx for clindamycin for 1 week, recommend continued monitoring, return for worsening, fevers, other concerning symptoms.    I personally performed the  services described in this documentation, which was scribed in my presence. The recorded information has been reviewed and is accurate.    Alvira MondayErin Meira Wahba, MD 03/02/16 1255

## 2016-12-16 ENCOUNTER — Emergency Department (HOSPITAL_BASED_OUTPATIENT_CLINIC_OR_DEPARTMENT_OTHER): Payer: Self-pay

## 2016-12-16 ENCOUNTER — Encounter (HOSPITAL_BASED_OUTPATIENT_CLINIC_OR_DEPARTMENT_OTHER): Payer: Self-pay | Admitting: Emergency Medicine

## 2016-12-16 ENCOUNTER — Emergency Department (HOSPITAL_BASED_OUTPATIENT_CLINIC_OR_DEPARTMENT_OTHER)
Admission: EM | Admit: 2016-12-16 | Discharge: 2016-12-16 | Disposition: A | Discharge status: Home / Self Care | Payer: Self-pay | Attending: Emergency Medicine | Admitting: Emergency Medicine

## 2016-12-16 DIAGNOSIS — I10 Essential (primary) hypertension: Secondary | ICD-10-CM | POA: Insufficient documentation

## 2016-12-16 DIAGNOSIS — Z87891 Personal history of nicotine dependence: Secondary | ICD-10-CM | POA: Insufficient documentation

## 2016-12-16 DIAGNOSIS — M545 Low back pain, unspecified: Secondary | ICD-10-CM

## 2016-12-16 DIAGNOSIS — M549 Dorsalgia, unspecified: Secondary | ICD-10-CM

## 2016-12-16 DIAGNOSIS — Z7984 Long term (current) use of oral hypoglycemic drugs: Secondary | ICD-10-CM | POA: Insufficient documentation

## 2016-12-16 DIAGNOSIS — W11XXXA Fall on and from ladder, initial encounter: Secondary | ICD-10-CM | POA: Insufficient documentation

## 2016-12-16 DIAGNOSIS — Y939 Activity, unspecified: Secondary | ICD-10-CM | POA: Insufficient documentation

## 2016-12-16 DIAGNOSIS — W19XXXA Unspecified fall, initial encounter: Secondary | ICD-10-CM

## 2016-12-16 DIAGNOSIS — J449 Chronic obstructive pulmonary disease, unspecified: Secondary | ICD-10-CM | POA: Insufficient documentation

## 2016-12-16 DIAGNOSIS — Z79899 Other long term (current) drug therapy: Secondary | ICD-10-CM | POA: Insufficient documentation

## 2016-12-16 DIAGNOSIS — J45909 Unspecified asthma, uncomplicated: Secondary | ICD-10-CM | POA: Insufficient documentation

## 2016-12-16 DIAGNOSIS — S93491A Sprain of other ligament of right ankle, initial encounter: Secondary | ICD-10-CM | POA: Insufficient documentation

## 2016-12-16 DIAGNOSIS — E119 Type 2 diabetes mellitus without complications: Secondary | ICD-10-CM | POA: Insufficient documentation

## 2016-12-16 DIAGNOSIS — Y999 Unspecified external cause status: Secondary | ICD-10-CM | POA: Insufficient documentation

## 2016-12-16 DIAGNOSIS — Y929 Unspecified place or not applicable: Secondary | ICD-10-CM | POA: Insufficient documentation

## 2016-12-16 DIAGNOSIS — M2392 Unspecified internal derangement of left knee: Secondary | ICD-10-CM | POA: Insufficient documentation

## 2016-12-16 HISTORY — DX: Type 2 diabetes mellitus without complications: E11.9

## 2016-12-16 HISTORY — DX: Morbid (severe) obesity due to excess calories: E66.01

## 2016-12-16 HISTORY — DX: Alcohol abuse, uncomplicated: F10.10

## 2016-12-16 MED ORDER — METHOCARBAMOL 500 MG PO TABS: 500.0000 mg | ORAL_TABLET | Freq: Three times a day (TID) | ORAL | 0 refills | Status: DC | PRN

## 2016-12-16 MED ORDER — OXYCODONE HCL 5 MG PO TABS
10.0000 mg | ORAL_TABLET | Freq: Once | ORAL | Status: AC
  Administered 2016-12-16: 10 mg via ORAL
  Filled 2016-12-16: qty 2

## 2016-12-16 MED ORDER — MORPHINE SULFATE (PF) 4 MG/ML IV SOLN
4.0000 mg | INTRAVENOUS | Status: AC | PRN
  Administered 2016-12-16 (×3): 4 mg via INTRAVENOUS
  Filled 2016-12-16 (×3): qty 1

## 2016-12-16 MED ORDER — ONDANSETRON HCL 4 MG/2ML IJ SOLN
4.0000 mg | Freq: Once | INTRAMUSCULAR | Status: AC
  Administered 2016-12-16: 4 mg via INTRAVENOUS
  Filled 2016-12-16: qty 2

## 2016-12-16 MED ORDER — OXYCODONE HCL 5 MG PO TABS: 5.0000 mg | ORAL_TABLET | ORAL | 0 refills | Status: DC | PRN

## 2016-12-16 NOTE — ED Provider Notes (Signed)
[facility] Provider Note   CSN: [id number] Arrival date & time: 12/16/16  1757  By signing my name below, I, Alyssa Grove, attest that this documentation has been prepared under the direction and in the presence of Rolland Porter, MD. Electronically Signed: Alyssa Grove, ED Scribe. 12/16/16. 7:38 PM.   History   Chief Complaint Chief Complaint  Patient presents with  . Fall   The history is provided by the patient. No language interpreter was used.    HPI Comments: Steven Dudley is a 41 y.o. male who presents to the Emergency Department complaining of constant, 7/10, pulsating and shooting, left knee pain s/p fall from 9 ft ladder earlier today. No LOC or head injury. Pain is exacerbated with movement and radiates down from left knee down to foot and up to lower back. Pt reports associated abrasion and pain to right lateral ribs, and lower back pain. No hx of back problems. Pt reports shellfish allergy. Denies urinary or bowel incontinence or any other complaints at this time.  Past Medical History:  Diagnosis Date  . Acid reflux   . Acute respiratory failure (HCC)   . Alcohol abuse   . Anxiety   . Asthma    daily and prn inhalers  . Chondromalacia of right patella 09/2014  . COPD (chronic obstructive pulmonary disease) (HCC)   . Diabetes mellitus without complication (HCC)   . Gastritis   . Gastritis   . History of traumatic brain injury 2006  . Hyperglycemia   . Hypertension    states under control with med., has been on med. x 1 yr.  . Meniscal cyst 09/2014   right knee  . Morbid obesity (HCC)   . Obesity   . Smokers' cough [facility])     Patient Active Problem List   Diagnosis Date Noted  . Diabetes (HCC) 01/11/2016  . Prediabetes 09/17/2015  . URI (upper respiratory infection) 09/14/2015  . Chest pain 09/14/2015  . Cough 09/14/2015  . Acute respiratory failure with hypoxia (HCC) 09/14/2015  . COPD exacerbation (HCC) 09/14/2015  . Tobacco use disorder  09/14/2015  . Tachycardia 09/14/2015  . Hyperglycemia 09/14/2015  . Acid reflux   . Obesity   . Hypertension   . COPD (chronic obstructive pulmonary disease) (HCC)     Past Surgical History:  Procedure Laterality Date  . APPENDECTOMY    . FOREIGN BODY REMOVAL Left 02/27/2008   hand  . INCISION AND DRAINAGE ABSCESS Left 03/01/2008   hand  . IRRIGATION AND DEBRIDEMENT ABSCESS Left 02/27/2008   hand  . KNEE ARTHROSCOPY WITH MEDIAL MENISECTOMY Right 09/25/2014   Procedure: RIGHT KNEE SCOPE CHONDROPLASTY/MEDIAL MENISCECTOMY;  Surgeon: Loreta Ave, MD;  Location: [facility];  Service: Orthopedics;  Laterality: Right;  ANESTHESIA: GENERAL, KNEE BLOCK  . TENOSYNOVECTOMY Left 02/27/2008   flexor tenosynovectomy left hand  . TONSILLECTOMY AND ADENOIDECTOMY         Home Medications    Prior to Admission medications   Medication Sig Start Date End Date Taking? Authorizing Provider  albuterol (PROVENTIL HFA;VENTOLIN HFA) 108 (90 BASE) MCG/ACT inhaler Inhale 2 puffs into the lungs every 4 (four) hours as needed for wheezing or shortness of breath. Patient not taking: Reported on 01/04/2016 09/17/15   Christiane Ha, MD  busPIRone (BUSPAR) 10 MG tablet Take 1 tablet (10 mg total) by mouth 2 (two) times daily. Patient not taking: Reported on 01/04/2016 09/17/15   Christiane Ha, MD  cyclobenzaprine (FLEXERIL) 10 MG tablet  Take 10 mg by mouth 3 (three) times daily as needed for muscle spasms. Reported on 01/04/2016    Historical Provider, MD  losartan (COZAAR) 100 MG tablet Take 100 mg by mouth daily.    Historical Provider, MD  metFORMIN (GLUCOPHAGE) 500 MG tablet Take 1 tablet (500 mg total) by mouth 2 (two) times daily with a meal. Patient not taking: Reported on 01/04/2016 10/08/15   Henrietta Hoover, NP  methocarbamol (ROBAXIN) 500 MG tablet Take 1 tablet (500 mg total) by mouth 3 (three) times daily between meals as needed. 12/16/16   Rolland Porter, MD    mometasone-formoterol (DULERA) 100-5 MCG/ACT AERO Inhale 2 puffs into the lungs 2 (two) times daily. Patient not taking: Reported on 01/04/2016 09/17/15   Christiane Ha, MD  nicotine (NICODERM CQ - DOSED IN MG/24 HOURS) 21 mg/24hr patch Place 1 patch (21 mg total) onto the skin daily. Patient not taking: Reported on 01/04/2016 09/17/15   Christiane Ha, MD  omeprazole (PRILOSEC) 20 MG capsule Take 1 capsule (20 mg total) by mouth daily. 01/22/16   Tilden Fossa, MD  oxyCODONE (ROXICODONE) 5 MG immediate release tablet Take 1 tablet (5 mg total) by mouth every 4 (four) hours as needed for severe pain. 12/16/16   Rolland Porter, MD  promethazine (PHENERGAN) 25 MG tablet Take 1 tablet (25 mg total) by mouth every 6 (six) hours as needed for nausea or vomiting. 01/22/16   Tilden Fossa, MD  Pseudoeph-Doxylamine-DM-APAP (DAYQUIL/NYQUIL COLD/FLU RELIEF PO) Take 2 capsules by mouth daily as needed (for cold). Reported on 01/04/2016    Historical Provider, MD  ranitidine (ZANTAC) 150 MG tablet Take 150 mg by mouth daily. Reported on 01/04/2016    Historical Provider, MD  sucralfate (CARAFATE) 1 GM/10ML suspension Take 10 mLs (1 g total) by mouth 4 (four) times daily -  with meals and at bedtime. 01/22/16   Tilden Fossa, MD  traMADol (ULTRAM) 50 MG tablet Take 1 tablet (50 mg total) by mouth every 6 (six) hours as needed. 03/01/16   Alvira Monday, MD    Family History History reviewed. No pertinent family history.  Social History Social History  Substance Use Topics  . Smoking status: Former Smoker    Packs/day: 1.50    Years: 26.00    Types: Cigarettes  . Smokeless tobacco: Former Neurosurgeon  . Alcohol use 0.0 oz/week     Comment: 2 x/week     Allergies   Acetaminophen and Shellfish allergy   Review of Systems Review of Systems  Constitutional: Negative for appetite change, chills, diaphoresis, fatigue and fever.  HENT: Negative for mouth sores, sore throat and trouble swallowing.   Eyes:  Negative for visual disturbance.  Respiratory: Negative for cough, chest tightness, shortness of breath and wheezing.   Cardiovascular: Negative for chest pain.  Gastrointestinal: Negative for abdominal distention, abdominal pain, diarrhea, nausea and vomiting.  Endocrine: Negative for polydipsia, polyphagia and polyuria.  Genitourinary: Negative for dysuria, frequency and hematuria.  Musculoskeletal: Positive for arthralgias and back pain. Negative for gait problem.  Skin: Positive for wound. Negative for color change, pallor and rash.  Neurological: Negative for dizziness, syncope, light-headedness and headaches.  Hematological: Does not bruise/bleed easily.  Psychiatric/Behavioral: Negative for behavioral problems and confusion.     Physical Exam Updated Vital Signs BP 120/72   Pulse 98   Temp 98.3 F (36.8 C) (Oral)   Resp 16   Ht 5\' 8"  (1.727 m)   Wt 290 lb (131.5 kg)  SpO2 93%   BMI 44.09 kg/m   Physical Exam  Constitutional: He is oriented to person, place, and time. He appears well-developed and well-nourished. No distress.  HENT:  Head: Normocephalic.  Eyes: Conjunctivae are normal. Pupils are equal, round, and reactive to light. No scleral icterus.  Neck: Normal range of motion. Neck supple. No thyromegaly present.  Cardiovascular: Normal rate and regular rhythm.  Exam reveals no gallop and no friction rub.   No murmur heard. Pulmonary/Chest: Effort normal and breath sounds normal. No respiratory distress. He has no wheezes. He has no rales.  Abdominal: Soft. Bowel sounds are normal. He exhibits no distension. There is no tenderness. There is no rebound.  Musculoskeletal: Normal range of motion.  With tenderness primarily and pain with range of motion of left knee. Has possible early effusion. Nontender over the calcanei. Nontender over malleoli the ankles. Good perfusion distally. Normal capillary refill sensation distally.  Neurological: He is alert and oriented to  person, place, and time.  Skin: Skin is warm and dry. No rash noted.  Psychiatric: He has a normal mood and affect. His behavior is normal.   ED Treatments / Results  DIAGNOSTIC STUDIES: Oxygen Saturation is 95% on RA, normal by my interpretation.    COORDINATION OF CARE: 7:26 PM Discussed treatment plan with pt at bedside which includes CT of his back and XR of lower extremities and pt agreed to plan.  Labs (all labs ordered are listed, but only abnormal results are displayed) Labs Reviewed - No data to display  EKG  EKG Interpretation None       Radiology Dg Tibia/fibula Left  Result Date: 12/16/2016 CLINICAL DATA:  Larey Seat 9 feet off of a ladder. EXAM: LEFT TIBIA AND FIBULA - 2 VIEW COMPARISON:  None. FINDINGS: Two-view study was reviewed along with the left ankle films of were performed at the same time. There is no evidence for acute fracture involving the tibia or fibula. No worrisome lytic or sclerotic osseous abnormality. Overlying soft tissues are unremarkable. IMPRESSION: Negative. Electronically Signed   By: Kennith Center M.D.   On: 12/16/2016 21:37   Dg Tibia/fibula Right  Result Date: 12/16/2016 CLINICAL DATA:  Larey Seat 9 feet off a ladder, landing on his back. Bilateral lower extremity pains and paresthesias. EXAM: RIGHT TIBIA AND FIBULA - 2 VIEW COMPARISON:  None. FINDINGS: There is no evidence of fracture or other focal bone lesions. Soft tissues are unremarkable. IMPRESSION: Negative. Electronically Signed   By: Ellery Plunk M.D.   On: 12/16/2016 21:29   Dg Ankle Complete Left  Result Date: 12/16/2016 CLINICAL DATA:  Patient fell 9 feet on to his back off of a ladder. EXAM: LEFT ANKLE COMPLETE - 3+ VIEW COMPARISON:  None. FINDINGS: Lateral film rotated. Within this limitation, no fracture is identified. No subluxation or dislocation. Ankle mortise is preserved. IMPRESSION: Negative. Electronically Signed   By: Kennith Center M.D.   On: 12/16/2016 21:36   Dg Ankle  Complete Right  Result Date: 12/16/2016 CLINICAL DATA:  41 y/o M; status post fall with bilateral lower extremity pain, numbness, and tingling. EXAM: RIGHT ANKLE - COMPLETE 3+ VIEW COMPARISON:  None. FINDINGS: On the lateral view there is a cortical irregular within the dorsal fibular tip which may represent a minimally displaced fracture. Talar dome is intact. Ankle mortise is symmetric on these nonstress views. IMPRESSION: On the lateral view there is a cortical irregularity within the dorsal fibular tip which may represent a minimally displaced fracture. Correlation for  focal pain is recommended. Electronically Signed   By: Mitzi Hansen M.D.   On: 12/16/2016 21:29   Ct Lumbar Spine Wo Contrast  Result Date: 12/16/2016 CLINICAL DATA:  Patient fell 9 feet onto is back off a ladder. Back pain. EXAM: CT LUMBAR SPINE WITHOUT CONTRAST TECHNIQUE: Multidetector CT imaging of the lumbar spine was performed without intravenous contrast administration. Multiplanar CT image reconstructions were also generated. COMPARISON:  None. FINDINGS: Segmentation: Normal Alignment: Maintained lumbar lordosis Vertebrae: No acute fracture. Superior endplate L3 Schmorl's node of incidental note. Small osteophytes noted off the superior endplate of T11, inferior endplate of L2 and superior endplate of L3. Small bone island of L5. There is mild facet sclerosis and hypertrophy from L4 through S1 consistent with degenerative facet arthropathy. Paraspinal and other soft tissues: Negative for paraspinal mass or hematoma. No adenopathy. Disc levels: No focal disc herniation or canal stenosis. Mild circumferential disc bulge at L2-3. No significant neural foraminal encroachment. IMPRESSION: 1. No acute fracture or malalignment of the lumbar spine. 2. No focal disc herniation, canal stenosis nor neural foraminal encroachment. Mild circumferential disc bulge at L2-3. 3. Mild lumbar facet arthropathy L4 through S1. Electronically  Signed   By: Tollie Eth M.D.   On: 12/16/2016 21:35   Dg Knee Complete 4 Views Left  Result Date: 12/16/2016 CLINICAL DATA:  Larey Seat off a ladder with pain to the lateral knee EXAM: LEFT KNEE - COMPLETE 4+ VIEW COMPARISON:  None. FINDINGS: No fracture or dislocation. Small to moderate suprapatellar joint effusion. Joint space compartments are grossly maintained. IMPRESSION: No acute osseous abnormality. Small to moderate suprapatellar joint effusion. Electronically Signed   By: Jasmine Pang M.D.   On: 12/16/2016 22:28    Procedures Procedures (including critical care time)  Medications Ordered in ED Medications  oxyCODONE (Oxy IR/ROXICODONE) immediate release tablet 10 mg (not administered)  ondansetron (ZOFRAN) injection 4 mg (4 mg Intravenous Given 12/16/16 2024)  morphine 4 MG/ML injection 4 mg (4 mg Intravenous Given 12/16/16 2233)     Initial Impression / Assessment and Plan / ED Course  I have reviewed the triage vital signs and the nursing notes.  Pertinent labs & imaging results that were available during my care of the patient were reviewed by me and considered in my medical decision making (see chart for details).    I personally performed the services described in this documentation, which was scribed in my presence. The recorded information has been reviewed and is accurate. No lumbar spine acute bony injuries noted. No compressions. No cranial fractures after his fall. He has subtle lucency suggestive of lateral malleolus fracture. However he does not have direct tenderness over this area. This was discussed with him. He has an effusion in his left knee and this is his area of most significant pain. I discussed with them the possibility of internal derangement. He is placed on crutches and fitted with an immobilizer. He'll be nonweightbearing until following up with orthopedics. Discharged with Percocet for pain and Robaxin for muscle spasms. He is neurologically intact without  neurological deficits.   Final Clinical Impressions(s) / ED Diagnoses   Final diagnoses:  Back pain  Fall  Fall, initial encounter  Acute midline low back pain without sciatica  Internal derangement of left knee  Sprain of anterior talofibular ligament of right ankle, initial encounter    New Prescriptions New Prescriptions   METHOCARBAMOL (ROBAXIN) 500 MG TABLET    Take 1 tablet (500 mg total) by mouth 3 (three)  times daily between meals as needed.   OXYCODONE (ROXICODONE) 5 MG IMMEDIATE RELEASE TABLET    Take 1 tablet (5 mg total) by mouth every 4 (four) hours as needed for severe pain.     Aurelia Gras, Rolland PorterMD 12/16/16 [phone]

## 2016-12-16 NOTE — ED Notes (Signed)
Patient transported to CT 

## 2016-12-16 NOTE — ED Notes (Signed)
Pt sats of 87%. Pt states "I have COPD. I feel fine and do not want oxygen." EMT still offered O2. RN and RRT notified.

## 2016-12-16 NOTE — Discharge Instructions (Signed)
Non weight bearing on left knee until seen by Orthopedic Dr. Valetta Closerutches as directed. You may have a subtle tiny fracture near the ligament of the right ankle. This is treated similar to a ankle sprain. Weight-bear as tolerated. Percocet for pain. Robaxin for muscle spasms.

## 2016-12-16 NOTE — ED Notes (Signed)
ED Provider at bedside. 

## 2016-12-16 NOTE — ED Triage Notes (Signed)
Patient states that he fell 9 feet onto his back off a ladder, reports that he is having pain to his bilateral lower extremitied with numbness and tingling to his left leg and feet. Abraisions noted to his right ribs. Denies any SOB  - no distress at this this time

## 2016-12-16 NOTE — ED Notes (Signed)
2220 pt returned from xray.

## 2018-04-22 ENCOUNTER — Emergency Department (HOSPITAL_COMMUNITY): Payer: Self-pay

## 2018-04-22 ENCOUNTER — Encounter (HOSPITAL_COMMUNITY): Payer: Self-pay

## 2018-04-22 ENCOUNTER — Other Ambulatory Visit: Payer: Self-pay

## 2018-04-22 ENCOUNTER — Inpatient Hospital Stay (HOSPITAL_COMMUNITY)
Admission: EM | Admit: 2018-04-22 | Discharge: 2018-04-25 | DRG: 871 | Disposition: A | Discharge status: Home / Self Care | Payer: Self-pay | Attending: Internal Medicine | Admitting: Internal Medicine

## 2018-04-22 DIAGNOSIS — Z9089 Acquired absence of other organs: Secondary | ICD-10-CM

## 2018-04-22 DIAGNOSIS — E669 Obesity, unspecified: Secondary | ICD-10-CM | POA: Diagnosis present

## 2018-04-22 DIAGNOSIS — J9601 Acute respiratory failure with hypoxia: Secondary | ICD-10-CM | POA: Diagnosis present

## 2018-04-22 DIAGNOSIS — Z800 Family history of malignant neoplasm of digestive organs: Secondary | ICD-10-CM

## 2018-04-22 DIAGNOSIS — Z79899 Other long term (current) drug therapy: Secondary | ICD-10-CM

## 2018-04-22 DIAGNOSIS — J441 Chronic obstructive pulmonary disease with (acute) exacerbation: Secondary | ICD-10-CM | POA: Diagnosis present

## 2018-04-22 DIAGNOSIS — Z7951 Long term (current) use of inhaled steroids: Secondary | ICD-10-CM

## 2018-04-22 DIAGNOSIS — Z91013 Allergy to seafood: Secondary | ICD-10-CM

## 2018-04-22 DIAGNOSIS — J00 Acute nasopharyngitis [common cold]: Secondary | ICD-10-CM

## 2018-04-22 DIAGNOSIS — A419 Sepsis, unspecified organism: Principal | ICD-10-CM | POA: Diagnosis present

## 2018-04-22 DIAGNOSIS — K2100 Gastro-esophageal reflux disease with esophagitis, without bleeding: Secondary | ICD-10-CM

## 2018-04-22 DIAGNOSIS — G4733 Obstructive sleep apnea (adult) (pediatric): Secondary | ICD-10-CM | POA: Diagnosis present

## 2018-04-22 DIAGNOSIS — Z23 Encounter for immunization: Secondary | ICD-10-CM

## 2018-04-22 DIAGNOSIS — Z7984 Long term (current) use of oral hypoglycemic drugs: Secondary | ICD-10-CM

## 2018-04-22 DIAGNOSIS — Z9119 Patient's noncompliance with other medical treatment and regimen: Secondary | ICD-10-CM

## 2018-04-22 DIAGNOSIS — E785 Hyperlipidemia, unspecified: Secondary | ICD-10-CM | POA: Diagnosis present

## 2018-04-22 DIAGNOSIS — J440 Chronic obstructive pulmonary disease with acute lower respiratory infection: Secondary | ICD-10-CM | POA: Diagnosis present

## 2018-04-22 DIAGNOSIS — J410 Simple chronic bronchitis: Secondary | ICD-10-CM

## 2018-04-22 DIAGNOSIS — F17210 Nicotine dependence, cigarettes, uncomplicated: Secondary | ICD-10-CM | POA: Diagnosis present

## 2018-04-22 DIAGNOSIS — E1169 Type 2 diabetes mellitus with other specified complication: Secondary | ICD-10-CM | POA: Diagnosis present

## 2018-04-22 DIAGNOSIS — F419 Anxiety disorder, unspecified: Secondary | ICD-10-CM | POA: Diagnosis present

## 2018-04-22 DIAGNOSIS — F1020 Alcohol dependence, uncomplicated: Secondary | ICD-10-CM | POA: Diagnosis present

## 2018-04-22 DIAGNOSIS — Z6841 Body Mass Index (BMI) 40.0 and over, adult: Secondary | ICD-10-CM

## 2018-04-22 DIAGNOSIS — Z886 Allergy status to analgesic agent status: Secondary | ICD-10-CM

## 2018-04-22 DIAGNOSIS — I10 Essential (primary) hypertension: Secondary | ICD-10-CM | POA: Diagnosis present

## 2018-04-22 DIAGNOSIS — K219 Gastro-esophageal reflux disease without esophagitis: Secondary | ICD-10-CM | POA: Diagnosis present

## 2018-04-22 DIAGNOSIS — Z87820 Personal history of traumatic brain injury: Secondary | ICD-10-CM

## 2018-04-22 DIAGNOSIS — J189 Pneumonia, unspecified organism: Secondary | ICD-10-CM | POA: Diagnosis present

## 2018-04-22 DIAGNOSIS — E119 Type 2 diabetes mellitus without complications: Secondary | ICD-10-CM | POA: Diagnosis present

## 2018-04-22 DIAGNOSIS — Z9049 Acquired absence of other specified parts of digestive tract: Secondary | ICD-10-CM

## 2018-04-22 LAB — CBC WITH DIFFERENTIAL/PLATELET
BASOS ABS: 0 10*3/uL (ref 0.0–0.1)
Basophils Relative: 0 %
Eosinophils Absolute: 0.2 10*3/uL (ref 0.0–0.7)
Eosinophils Relative: 1 %
HCT: 49.6 % (ref 39.0–52.0)
HEMOGLOBIN: 16.6 g/dL (ref 13.0–17.0)
Lymphocytes Relative: 18 %
Lymphs Abs: 3.2 10*3/uL (ref 0.7–4.0)
MCH: 31.1 pg (ref 26.0–34.0)
MCHC: 33.5 g/dL (ref 30.0–36.0)
MCV: 93.1 fL (ref 78.0–100.0)
MONO ABS: 1.6 10*3/uL — AB (ref 0.1–1.0)
Monocytes Relative: 9 %
NEUTROS PCT: 72 %
Neutro Abs: 12.8 10*3/uL — ABNORMAL HIGH (ref 1.7–7.7)
PLATELETS: 321 10*3/uL (ref 150–400)
RBC: 5.33 MIL/uL (ref 4.22–5.81)
RDW: 13 % (ref 11.5–15.5)
WBC: 17.8 10*3/uL — AB (ref 4.0–10.5)

## 2018-04-22 LAB — BASIC METABOLIC PANEL
Anion gap: 12 (ref 5–15)
BUN: 10 mg/dL (ref 6–20)
CO2: 36 mmol/L — ABNORMAL HIGH (ref 22–32)
Calcium: 9 mg/dL (ref 8.9–10.3)
Chloride: 93 mmol/L — ABNORMAL LOW (ref 98–111)
Creatinine, Ser: 0.81 mg/dL (ref 0.61–1.24)
Glucose, Bld: 89 mg/dL (ref 70–99)
POTASSIUM: 3.2 mmol/L — AB (ref 3.5–5.1)
Sodium: 141 mmol/L (ref 135–145)

## 2018-04-22 LAB — EXPECTORATED SPUTUM ASSESSMENT W GRAM STAIN, RFLX TO RESP C

## 2018-04-22 LAB — EXPECTORATED SPUTUM ASSESSMENT W REFEX TO RESP CULTURE

## 2018-04-22 LAB — GLUCOSE, CAPILLARY: Glucose-Capillary: 293 mg/dL — ABNORMAL HIGH (ref 70–99)

## 2018-04-22 LAB — TROPONIN I

## 2018-04-22 LAB — CBG MONITORING, ED: Glucose-Capillary: 111 mg/dL — ABNORMAL HIGH (ref 70–99)

## 2018-04-22 LAB — MAGNESIUM: MAGNESIUM: 2 mg/dL (ref 1.7–2.4)

## 2018-04-22 LAB — BRAIN NATRIURETIC PEPTIDE: B NATRIURETIC PEPTIDE 5: 70.8 pg/mL (ref 0.0–100.0)

## 2018-04-22 LAB — TSH: TSH: 0.826 u[IU]/mL (ref 0.350–4.500)

## 2018-04-22 MED ORDER — ASPIRIN 81 MG PO CHEW
81.0000 mg | CHEWABLE_TABLET | Freq: Every day | ORAL | Status: DC
  Administered 2018-04-22 – 2018-04-25 (×4): 81 mg via ORAL
  Filled 2018-04-22 (×4): qty 1

## 2018-04-22 MED ORDER — FUROSEMIDE 40 MG PO TABS
20.0000 mg | ORAL_TABLET | Freq: Two times a day (BID) | ORAL | Status: DC
  Administered 2018-04-22 – 2018-04-25 (×6): 20 mg via ORAL
  Filled 2018-04-22 (×6): qty 1

## 2018-04-22 MED ORDER — METHYLPREDNISOLONE SODIUM SUCC 40 MG IJ SOLR
40.0000 mg | Freq: Two times a day (BID) | INTRAMUSCULAR | Status: DC
  Administered 2018-04-23 – 2018-04-24 (×3): 40 mg via INTRAVENOUS
  Filled 2018-04-22 (×3): qty 1

## 2018-04-22 MED ORDER — HYDRALAZINE HCL 20 MG/ML IJ SOLN: 10.0000 mg | INTRAMUSCULAR | Status: DC | PRN

## 2018-04-22 MED ORDER — ALBUTEROL SULFATE (2.5 MG/3ML) 0.083% IN NEBU
2.5000 mg | INHALATION_SOLUTION | RESPIRATORY_TRACT | Status: DC
  Administered 2018-04-22: 2.5 mg via RESPIRATORY_TRACT
  Filled 2018-04-22: qty 3

## 2018-04-22 MED ORDER — ONDANSETRON HCL 4 MG/2ML IJ SOLN: 4.0000 mg | Freq: Four times a day (QID) | INTRAMUSCULAR | Status: DC | PRN

## 2018-04-22 MED ORDER — ALBUTEROL SULFATE (2.5 MG/3ML) 0.083% IN NEBU
5.0000 mg | INHALATION_SOLUTION | Freq: Once | RESPIRATORY_TRACT | Status: AC
  Administered 2018-04-22: 5 mg via RESPIRATORY_TRACT
  Filled 2018-04-22: qty 6

## 2018-04-22 MED ORDER — INSULIN ASPART 100 UNIT/ML ~~LOC~~ SOLN
0.0000 [IU] | Freq: Three times a day (TID) | SUBCUTANEOUS | Status: DC
  Administered 2018-04-23 (×3): 3 [IU] via SUBCUTANEOUS
  Administered 2018-04-24: 5 [IU] via SUBCUTANEOUS
  Administered 2018-04-24: 2 [IU] via SUBCUTANEOUS
  Administered 2018-04-24: 3 [IU] via SUBCUTANEOUS
  Administered 2018-04-25 (×2): 1 [IU] via SUBCUTANEOUS

## 2018-04-22 MED ORDER — IPRATROPIUM BROMIDE 0.02 % IN SOLN
0.5000 mg | RESPIRATORY_TRACT | Status: DC
  Administered 2018-04-22: 0.5 mg via RESPIRATORY_TRACT
  Filled 2018-04-22: qty 2.5

## 2018-04-22 MED ORDER — ALBUTEROL (5 MG/ML) CONTINUOUS INHALATION SOLN
10.0000 mg/h | INHALATION_SOLUTION | RESPIRATORY_TRACT | Status: DC
  Administered 2018-04-22: 10 mg/h via RESPIRATORY_TRACT
  Filled 2018-04-22: qty 20

## 2018-04-22 MED ORDER — CEFTRIAXONE SODIUM 1 G IJ SOLR
1.0000 g | Freq: Once | INTRAMUSCULAR | Status: AC
  Administered 2018-04-22: 1 g via INTRAVENOUS
  Filled 2018-04-22: qty 10

## 2018-04-22 MED ORDER — ENOXAPARIN SODIUM 60 MG/0.6ML ~~LOC~~ SOLN
60.0000 mg | Freq: Every day | SUBCUTANEOUS | Status: DC
  Administered 2018-04-22 – 2018-04-24 (×3): 60 mg via SUBCUTANEOUS
  Filled 2018-04-22 (×3): qty 0.6

## 2018-04-22 MED ORDER — SODIUM CHLORIDE 0.9 % IV SOLN
500.0000 mg | INTRAVENOUS | Status: DC
  Administered 2018-04-23: 500 mg via INTRAVENOUS
  Filled 2018-04-22: qty 500

## 2018-04-22 MED ORDER — PNEUMOCOCCAL VAC POLYVALENT 25 MCG/0.5ML IJ INJ
0.5000 mL | INJECTION | INTRAMUSCULAR | Status: AC
  Administered 2018-04-23: 0.5 mL via INTRAMUSCULAR
  Filled 2018-04-22: qty 0.5

## 2018-04-22 MED ORDER — LOSARTAN POTASSIUM 50 MG PO TABS
50.0000 mg | ORAL_TABLET | Freq: Every day | ORAL | Status: DC
  Administered 2018-04-23 – 2018-04-25 (×3): 50 mg via ORAL
  Filled 2018-04-22 (×3): qty 1

## 2018-04-22 MED ORDER — ALBUTEROL SULFATE (2.5 MG/3ML) 0.083% IN NEBU
2.5000 mg | INHALATION_SOLUTION | RESPIRATORY_TRACT | Status: DC | PRN
Start: 2018-04-22 — End: 2018-04-25

## 2018-04-22 MED ORDER — SODIUM CHLORIDE 0.9 % IV SOLN
1.0000 g | INTRAVENOUS | Status: DC
  Administered 2018-04-23 – 2018-04-24 (×2): 1 g via INTRAVENOUS
  Filled 2018-04-22: qty 10
  Filled 2018-04-22: qty 1

## 2018-04-22 MED ORDER — ONDANSETRON HCL 4 MG PO TABS: 4.0000 mg | ORAL_TABLET | Freq: Four times a day (QID) | ORAL | Status: DC | PRN

## 2018-04-22 MED ORDER — ATORVASTATIN CALCIUM 20 MG PO TABS
20.0000 mg | ORAL_TABLET | Freq: Every day | ORAL | Status: DC
  Administered 2018-04-23 – 2018-04-24 (×2): 20 mg via ORAL
  Filled 2018-04-22 (×2): qty 1

## 2018-04-22 MED ORDER — BUDESONIDE 0.25 MG/2ML IN SUSP
0.2500 mg | Freq: Two times a day (BID) | RESPIRATORY_TRACT | Status: DC
  Administered 2018-04-22 – 2018-04-25 (×6): 0.25 mg via RESPIRATORY_TRACT
  Filled 2018-04-22 (×5): qty 2

## 2018-04-22 MED ORDER — IPRATROPIUM-ALBUTEROL 0.5-2.5 (3) MG/3ML IN SOLN: 3.0000 mL | Freq: Four times a day (QID) | RESPIRATORY_TRACT | Status: DC

## 2018-04-22 MED ORDER — VITAMIN B-1 100 MG PO TABS
100.0000 mg | ORAL_TABLET | Freq: Every day | ORAL | Status: DC
  Administered 2018-04-22 – 2018-04-25 (×4): 100 mg via ORAL
  Filled 2018-04-22 (×4): qty 1

## 2018-04-22 MED ORDER — NICOTINE 21 MG/24HR TD PT24
21.0000 mg | MEDICATED_PATCH | Freq: Once | TRANSDERMAL | Status: AC
  Administered 2018-04-22: 21 mg via TRANSDERMAL
  Filled 2018-04-22: qty 1

## 2018-04-22 MED ORDER — SODIUM CHLORIDE 0.9 % IV SOLN
500.0000 mg | Freq: Once | INTRAVENOUS | Status: AC
  Administered 2018-04-22: 500 mg via INTRAVENOUS
  Filled 2018-04-22: qty 500

## 2018-04-22 MED ORDER — METHYLPREDNISOLONE SODIUM SUCC 125 MG IJ SOLR
125.0000 mg | Freq: Once | INTRAMUSCULAR | Status: AC
Start: 2018-04-22 — End: 2018-04-22
  Administered 2018-04-22: 125 mg via INTRAVENOUS
  Filled 2018-04-22: qty 2

## 2018-04-22 MED ORDER — SODIUM CHLORIDE 0.9 % IV SOLN
Freq: Once | INTRAVENOUS | Status: AC
  Administered 2018-04-22: 18:00:00 via INTRAVENOUS

## 2018-04-22 MED ORDER — POTASSIUM CHLORIDE CRYS ER 20 MEQ PO TBCR
40.0000 meq | EXTENDED_RELEASE_TABLET | Freq: Once | ORAL | Status: AC
  Administered 2018-04-22: 40 meq via ORAL
  Filled 2018-04-22: qty 2

## 2018-04-22 MED ORDER — GUAIFENESIN-DM 100-10 MG/5ML PO SYRP
10.0000 mL | ORAL_SOLUTION | Freq: Four times a day (QID) | ORAL | Status: DC | PRN
  Administered 2018-04-22 – 2018-04-25 (×6): 10 mL via ORAL
  Filled 2018-04-22 (×6): qty 10

## 2018-04-22 MED ORDER — IPRATROPIUM-ALBUTEROL 0.5-2.5 (3) MG/3ML IN SOLN
3.0000 mL | RESPIRATORY_TRACT | Status: DC
  Administered 2018-04-23 – 2018-04-24 (×8): 3 mL via RESPIRATORY_TRACT
  Filled 2018-04-22 (×8): qty 3

## 2018-04-22 MED ORDER — TRAMADOL HCL 50 MG PO TABS
50.0000 mg | ORAL_TABLET | Freq: Four times a day (QID) | ORAL | Status: DC | PRN
  Administered 2018-04-22 – 2018-04-24 (×5): 50 mg via ORAL
  Filled 2018-04-22 (×5): qty 1

## 2018-04-22 NOTE — ED Notes (Signed)
RN floor unable to take report at this time.

## 2018-04-22 NOTE — Progress Notes (Signed)
Rx Brief note: Lovenox  Wt=129 kg, CrCl~ 156 ml/min, BMI=44  Rx adjusted Lovenox to 60 mg daily in pt with BMI>30  Thanks Lorenza EvangelistGreen, Glyn Gerads R 04/22/2018 9:51 PM

## 2018-04-22 NOTE — H&P (Signed)
History and Physical    Steven Dudley ZOX:[id number] DOB: 31-Jan-1976 DOA: 04/22/2018  PCP: Macy Mis, MD  Patient coming from: Home.  Chief Complaint: Shortness of breath.  HPI: Steven Dudley is a 42 y.o. male with history of COPD diabetes mellitus, hypertension hyperlipidemia presents to the ER with complaint of shortness of breath and productive cough which is been ongoing for last 10 days.  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  Has been having discolored sputum.  Chest pain only on coughing.  Otherwise chest pain-free.  Denies any fever chills.  ED Course: The ER patient was found to be wheezing chest x-ray shows possibility of pneumonia patient admitted for COPD with possible pneumonia.  Blood pressure was elevated.  Review of Systems: As per HPI, rest all negative.   Past Medical History:  Diagnosis Date  . Acid reflux   . Acute respiratory failure (HCC)   . Alcohol abuse   . Anxiety   . Asthma    daily and prn inhalers  . Chondromalacia of right patella 09/2014  . COPD (chronic obstructive pulmonary disease) (HCC)   . Diabetes mellitus without complication (HCC)    TYPE II  . Gastritis   . Gastritis   . History of traumatic brain injury 2006  . Hyperglycemia   . Hypertension    states under control with med., has been on med. x 1 yr.  . Meniscal cyst 09/2014   right knee  . Morbid obesity (HCC)   . Obesity   . Smokers' cough [facility])     Past Surgical History:  Procedure Laterality Date  . APPENDECTOMY    . FOREIGN BODY REMOVAL Left 02/27/2008   hand  . INCISION AND DRAINAGE ABSCESS Left 03/01/2008   hand  . IRRIGATION AND DEBRIDEMENT ABSCESS Left 02/27/2008   hand  . KNEE ARTHROSCOPY WITH MEDIAL MENISECTOMY Right 09/25/2014   Procedure: RIGHT KNEE SCOPE CHONDROPLASTY/MEDIAL MENISCECTOMY;  Surgeon: Loreta Ave, MD;  Location: [facility];  Service: Orthopedics;  Laterality: Right;   ANESTHESIA: GENERAL, KNEE BLOCK  . TENOSYNOVECTOMY Left 02/27/2008   flexor tenosynovectomy left hand  . TONSILLECTOMY AND ADENOIDECTOMY       reports that he has quit smoking. His smoking use included cigarettes. He has a 39.00 pack-year smoking history. He has quit using smokeless tobacco. He reports that he drinks alcohol. He reports that he does not use drugs.  Allergies  Allergen Reactions  . Acetaminophen Other (See Comments)    GI BLEEDING Other reaction(s): Other GI BLEEDING  . Shellfish Allergy Nausea And Vomiting, Nausea Only and Other (See Comments)    Headache, heat flashes. Other reaction(s): Other Headache, heat flashes.    Family History  Problem Relation Age of Onset  . Stomach cancer Mother     Prior to Admission medications   Medication Sig Start Date End Date Taking? Authorizing Provider  aspirin 81 MG chewable tablet Chew 81 mg by mouth daily.   Yes [provider]  atorvastatin (LIPITOR) 20 MG tablet Take 20 mg by mouth daily.   Yes [provider]  Fluticasone-Salmeterol (ADVAIR) 250-50 MCG/DOSE AEPB Inhale 1 puff into the lungs 2 (two) times daily.   Yes [provider]  furosemide (LASIX) 20 MG tablet Take 20 mg by mouth 2 (two) times daily.   Yes [provider]  glipiZIDE (GLUCOTROL XL) 5 MG 24 hr tablet Take 5 mg by mouth daily with  breakfast.   Yes [provider]  losartan (COZAAR) 100 MG tablet Take 50 mg by mouth daily.    Yes [provider]  metFORMIN (GLUCOPHAGE XR) 500 MG 24 hr tablet Take 2,000 mg by mouth See admin instructions. Take 1000 mg twice daily with a meal 02/01/18 02/01/19 Yes [provider]  metFORMIN (GLUCOPHAGE) 500 MG tablet Take 1 tablet (500 mg total) by mouth 2 (two) times daily with a meal. Patient taking differently: Take 1,000 mg by mouth 2 (two) times daily with a meal.  10/08/15  Yes Henrietta HooverBernhardt, Linda C, NP  sildenafil (REVATIO) 20 MG tablet Take 40-100 mg by  mouth daily as needed (sexual activity).   Yes [provider]  albuterol (PROVENTIL HFA;VENTOLIN HFA) 108 (90 BASE) MCG/ACT inhaler Inhale 2 puffs into the lungs every 4 (four) hours as needed for wheezing or shortness of breath. Patient not taking: Reported on 01/04/2016 09/17/15   Christiane HaSullivan, Corinna L, MD  busPIRone (BUSPAR) 10 MG tablet Take 1 tablet (10 mg total) by mouth 2 (two) times daily. Patient not taking: Reported on 01/04/2016 09/17/15   Christiane HaSullivan, Corinna L, MD  methocarbamol (ROBAXIN) 500 MG tablet Take 1 tablet (500 mg total) by mouth 3 (three) times daily between meals as needed. 12/16/16   Rolland PorterJames, Mark, MD  mometasone-formoterol [facility](DULERA) 100-5 MCG/ACT AERO Inhale 2 puffs into the lungs 2 (two) times daily. Patient not taking: Reported on 01/04/2016 09/17/15   Christiane HaSullivan, Corinna L, MD  nicotine (NICODERM CQ - DOSED IN MG/24 HOURS) 21 mg/24hr patch Place 1 patch (21 mg total) onto the skin daily. Patient not taking: Reported on 01/04/2016 09/17/15   Christiane HaSullivan, Corinna L, MD  omeprazole (PRILOSEC) 20 MG capsule Take 1 capsule (20 mg total) by mouth daily. 01/22/16   Tilden Fossaees, Elizabeth, MD  oxyCODONE (ROXICODONE) 5 MG immediate release tablet Take 1 tablet (5 mg total) by mouth every 4 (four) hours as needed for severe pain. 12/16/16   Rolland PorterJames, Mark, MD  promethazine (PHENERGAN) 25 MG tablet Take 1 tablet (25 mg total) by mouth every 6 (six) hours as needed for nausea or vomiting. 01/22/16   Tilden Fossaees, Elizabeth, MD  Pseudoeph-Doxylamine-DM-APAP (DAYQUIL/NYQUIL COLD/FLU RELIEF PO) Take 2 capsules by mouth daily as needed (for cold). Reported on 01/04/2016    [provider]  ranitidine (ZANTAC) 150 MG tablet Take 150 mg by mouth daily. Reported on 01/04/2016    [provider]  sucralfate (CARAFATE) 1 GM/10ML suspension Take 10 mLs (1 g total) by mouth 4 (four) times daily -  with meals and at bedtime. 01/22/16   Tilden Fossaees, Elizabeth, MD  traMADol (ULTRAM) 50 MG tablet Take 1 tablet (50 mg total)  by mouth every 6 (six) hours as needed. 03/01/16   Alvira MondaySchlossman, Erin, MD    Physical Exam: Vitals:   04/22/18 1930 04/22/18 2000 04/22/18 2030 04/22/18 2034  BP: (!) 171/72 (!) 159/67 (!) 146/80 (!) 162/81  Pulse: (!) 102 98 95 (!) 102  Resp: 16 19 (!) 21 20  Temp:      TempSrc:      SpO2: 90% 93% 90% (!) 89%  Weight:      Height:          Constitutional: Moderately built and nourished. Vitals:   04/22/18 1930 04/22/18 2000 04/22/18 2030 04/22/18 2034  BP: (!) 171/72 (!) 159/67 (!) 146/80 (!) 162/81  Pulse: (!) 102 98 95 (!) 102  Resp: 16 19 (!) 21 20  Temp:      TempSrc:  SpO2: 90% 93% 90% (!) 89%  Weight:      Height:       Eyes: Anicteric no pallor. ENMT: No discharge from the ears eyes nose or mouth. Neck: No neck rigidity but no JVD appreciated. Respiratory: Bilateral expiratory wheeze and no crepitations. Cardiovascular: S1-S2 heard no murmurs appreciated. Abdomen: Soft nontender bowel sounds present. Musculoskeletal: No edema.  No joint effusion. Skin: No rash.  Skin appears warm. Neurologic: Alert awake oriented to time place and person.  Moves all extremities. Psychiatric: Appears normal per normal affect.   Labs on Admission: I have personally reviewed following labs and imaging studies  CBC: Recent Labs  Lab 04/22/18 1751  WBC 17.8*  NEUTROABS 12.8*  HGB 16.6  HCT 49.6  MCV 93.1  PLT 321   Basic Metabolic Panel: Recent Labs  Lab 04/22/18 1751  NA 141  K 3.2*  CL 93*  CO2 36*  GLUCOSE 89  BUN 10  CREATININE 0.81  CALCIUM 9.0   GFR: Estimated Creatinine Clearance: 156.7 mL/min (by C-G formula based on SCr of 0.81 mg/dL). Liver Function Tests: No results for input(s): AST, ALT, ALKPHOS, BILITOT, PROT, ALBUMIN in the last 168 hours. No results for input(s): LIPASE, AMYLASE in the last 168 hours. No results for input(s): AMMONIA in the last 168 hours. Coagulation Profile: No results for input(s): INR, PROTIME in the last 168  hours. Cardiac Enzymes: Recent Labs  Lab 04/22/18 1751  TROPONINI <0.03   BNP (last 3 results) No results for input(s): PROBNP in the last 8760 hours. HbA1C: No results for input(s): HGBA1C in the last 72 hours. CBG: Recent Labs  Lab 04/22/18 1642  GLUCAP 111*   Lipid Profile: No results for input(s): CHOL, HDL, LDLCALC, TRIG, CHOLHDL, LDLDIRECT in the last 72 hours. Thyroid Function Tests: No results for input(s): TSH, T4TOTAL, FREET4, T3FREE, THYROIDAB in the last 72 hours. Anemia Panel: No results for input(s): VITAMINB12, FOLATE, FERRITIN, TIBC, IRON, RETICCTPCT in the last 72 hours. Urine analysis:    Component Value Date/Time   COLORURINE AMBER (A) 09/14/2015 1645   APPEARANCEUR HAZY (A) 09/14/2015 1645   LABSPEC 1.021 09/14/2015 1645   PHURINE 6.0 09/14/2015 1645   GLUCOSEU NEGATIVE 09/14/2015 1645   HGBUR SMALL (A) 09/14/2015 1645   BILIRUBINUR NEGATIVE 09/14/2015 1645   KETONESUR NEGATIVE 09/14/2015 1645   PROTEINUR 100 (A) 09/14/2015 1645   UROBILINOGEN 0.2 08/11/2013 1315   NITRITE NEGATIVE 09/14/2015 1645   LEUKOCYTESUR NEGATIVE 09/14/2015 1645   Sepsis Labs: @LABRCNTIP (procalcitonin:4,lacticidven:4) )No results found for this or any previous visit (from the past 240 hour(s)).   Radiological Exams on Admission: Dg Chest 2 View  Result Date: 04/22/2018 CLINICAL DATA:  42 y/o M; shortness of breath and cough. History of COPD, diabetes, and smoking. EXAM: CHEST - 2 VIEW COMPARISON:  09/14/2015 chest radiograph FINDINGS: Stable normal cardiac silhouette given projection and technique. Diffuse reticular opacities similar to prior chest radiograph. No consolidation, effusion, or pneumothorax. Bones are unremarkable. IMPRESSION: Diffuse reticular opacities possibly representing atypical pneumonia or bronchitis. No consolidation. Electronically Signed   By: Mitzi Hansen M.D.   On: 04/22/2018 17:28      Assessment/Plan Principal Problem:   Acute  exacerbation of chronic obstructive pulmonary disease (COPD) (HCC) Active Problems:   Hypertension   Diabetes mellitus type 2 in obese (HCC)    1. Acute respiratory failure with hypoxia likely from COPD exacerbation and possible pneumonia.  Patient was placed on Solu-Medrol nebulizer treatment Pulmicort and antibiotics. 2. Possible pneumonia on  ceftriaxone and Zithromax.  Follow sputum cultures urine for Legionella and strep antigen. 3. Hypertension uncontrolled in addition to home medication Cozaar will place patient on PRN IV hydralazine. 4. Diabetes mellitus type 2 -holding oral hypoglycemics while inpatient and keeping patient on sliding scale coverage.  Closely follow CBGs since patient is on IV steroids. 5. Hyperlipidemia on statins. 6. Previous history of alcoholism patient states patient has cut down his alcohol use and has not had any alcohol last 5 days.  Closely follow for any signs of withdrawal.  On thiamine. 7. History of taking Lasix for lower extremity edema.  Presently no edema.  EKG pending.   DVT prophylaxis: Lovenox. Code Status: Full code. Family Communication: Discussed with patient. Disposition Plan: Home. Consults called: None. Admission status: Observation.   Eduard Clos MD [facility] Pager [phone].  If 7PM-7AM, please contact night-coverage [email or web address] Password [facility]  04/22/2018, 8:43 PM

## 2018-04-22 NOTE — ED Triage Notes (Signed)
PT C/O PRODUCTIVE COUGH WITH GREEN SPUTUM AND SOB X 2 WEEKS. PT STS HE HAS A HX OF COPD, AND HAS BEEN USING HIS NEBULIZER W/O RELIEF. PT STS HE WENT TO [facility] FOR HELP PRIOR TO COMING HERE, AND WAS TURNED AWAY. PT STS SOME FEVER ON AND OFF AS WELL.

## 2018-04-22 NOTE — ED Provider Notes (Signed)
[facility]-EMERGENCY DEPT Provider Note   CSN: [id number] Arrival date & time: 04/22/18  1547     History   Chief Complaint Chief Complaint  Patient presents with  . Cough  . Shortness of Breath    HPI Steven Dudley is a 42 y.o. male.  HPI Patient with long history of smoking, COPD, actively using cigarettes presents with chest pain, dyspnea, cough. Onset was 10 days ago, and since onset it is been persistent in spite of using double his typical amount of albuterol at home. No objective fever, though he does have subjective sensation of this. Chest pain as tightness, diffuse across the anterior chest. No confusion, disorientation, unilateral weakness. Patient is here with his wife who assists with the HPI.  Past Medical History:  Diagnosis Date  . Acid reflux   . Acute respiratory failure (HCC)   . Alcohol abuse   . Anxiety   . Asthma    daily and prn inhalers  . Chondromalacia of right patella 09/2014  . COPD (chronic obstructive pulmonary disease) (HCC)   . Diabetes mellitus without complication (HCC)    TYPE II  . Gastritis   . Gastritis   . History of traumatic brain injury 2006  . Hyperglycemia   . Hypertension    states under control with med., has been on med. x 1 yr.  . Meniscal cyst 09/2014   right knee  . Morbid obesity (HCC)   . Obesity   . Smokers' cough [facility])     Patient Active Problem List   Diagnosis Date Noted  . Diabetes (HCC) 01/11/2016  . Prediabetes 09/17/2015  . URI (upper respiratory infection) 09/14/2015  . Chest pain 09/14/2015  . Cough 09/14/2015  . Acute respiratory failure with hypoxia (HCC) 09/14/2015  . COPD exacerbation (HCC) 09/14/2015  . Tobacco use disorder 09/14/2015  . Tachycardia 09/14/2015  . Hyperglycemia 09/14/2015  . Acid reflux   . Obesity   . Hypertension   . COPD (chronic obstructive pulmonary disease) (HCC)     Past Surgical History:  Procedure Laterality Date  . APPENDECTOMY     . FOREIGN BODY REMOVAL Left 02/27/2008   hand  . INCISION AND DRAINAGE ABSCESS Left 03/01/2008   hand  . IRRIGATION AND DEBRIDEMENT ABSCESS Left 02/27/2008   hand  . KNEE ARTHROSCOPY WITH MEDIAL MENISECTOMY Right 09/25/2014   Procedure: RIGHT KNEE SCOPE CHONDROPLASTY/MEDIAL MENISCECTOMY;  Surgeon: Loreta Ave, MD;  Location: [facility];  Service: Orthopedics;  Laterality: Right;  ANESTHESIA: GENERAL, KNEE BLOCK  . TENOSYNOVECTOMY Left 02/27/2008   flexor tenosynovectomy left hand  . TONSILLECTOMY AND ADENOIDECTOMY          Home Medications    Prior to Admission medications   Medication Sig Start Date End Date Taking? Authorizing Provider  aspirin 81 MG chewable tablet Chew 81 mg by mouth daily.   Yes [provider]  atorvastatin (LIPITOR) 20 MG tablet Take 20 mg by mouth daily.   Yes [provider]  Fluticasone-Salmeterol (ADVAIR) 250-50 MCG/DOSE AEPB Inhale 1 puff into the lungs 2 (two) times daily.   Yes [provider]  furosemide (LASIX) 20 MG tablet Take 20 mg by mouth 2 (two) times daily.   Yes [provider]  glipiZIDE (GLUCOTROL XL) 5 MG 24 hr tablet Take 5 mg by mouth daily with breakfast.   Yes [provider]  losartan (COZAAR) 100 MG tablet Take 50 mg by mouth daily.    Yes [provider]  metFORMIN (GLUCOPHAGE XR) 500 MG 24 hr tablet Take 2,000 mg by mouth See admin instructions. Take 1000 mg twice daily with a meal 02/01/18 02/01/19 Yes [provider]  metFORMIN (GLUCOPHAGE) 500 MG tablet Take 1 tablet (500 mg total) by mouth 2 (two) times daily with a meal. Patient taking differently: Take 1,000 mg by mouth 2 (two) times daily with a meal.  10/08/15  Yes Henrietta Hoover, NP  sildenafil (REVATIO) 20 MG tablet Take 40-100 mg by mouth daily as needed (sexual activity).   Yes [provider]  albuterol (PROVENTIL HFA;VENTOLIN HFA) 108 (90 BASE) MCG/ACT inhaler Inhale 2 puffs into  the lungs every 4 (four) hours as needed for wheezing or shortness of breath. Patient not taking: Reported on 01/04/2016 09/17/15   Christiane Ha, MD  busPIRone (BUSPAR) 10 MG tablet Take 1 tablet (10 mg total) by mouth 2 (two) times daily. Patient not taking: Reported on 01/04/2016 09/17/15   Christiane Ha, MD  methocarbamol (ROBAXIN) 500 MG tablet Take 1 tablet (500 mg total) by mouth 3 (three) times daily between meals as needed. 12/16/16   Rolland Porter, MD  mometasone-formoterol [facility]) 100-5 MCG/ACT AERO Inhale 2 puffs into the lungs 2 (two) times daily. Patient not taking: Reported on 01/04/2016 09/17/15   Christiane Ha, MD  nicotine (NICODERM CQ - DOSED IN MG/24 HOURS) 21 mg/24hr patch Place 1 patch (21 mg total) onto the skin daily. Patient not taking: Reported on 01/04/2016 09/17/15   Christiane Ha, MD  omeprazole (PRILOSEC) 20 MG capsule Take 1 capsule (20 mg total) by mouth daily. 01/22/16   Tilden Fossa, MD  oxyCODONE (ROXICODONE) 5 MG immediate release tablet Take 1 tablet (5 mg total) by mouth every 4 (four) hours as needed for severe pain. 12/16/16   Rolland Porter, MD  promethazine (PHENERGAN) 25 MG tablet Take 1 tablet (25 mg total) by mouth every 6 (six) hours as needed for nausea or vomiting. 01/22/16   Tilden Fossa, MD  Pseudoeph-Doxylamine-DM-APAP (DAYQUIL/NYQUIL COLD/FLU RELIEF PO) Take 2 capsules by mouth daily as needed (for cold). Reported on 01/04/2016    [provider]  ranitidine (ZANTAC) 150 MG tablet Take 150 mg by mouth daily. Reported on 01/04/2016    [provider]  sucralfate (CARAFATE) 1 GM/10ML suspension Take 10 mLs (1 g total) by mouth 4 (four) times daily -  with meals and at bedtime. 01/22/16   Tilden Fossa, MD  traMADol (ULTRAM) 50 MG tablet Take 1 tablet (50 mg total) by mouth every 6 (six) hours as needed. 03/01/16   Alvira Monday, MD    Family History History reviewed. No pertinent family history.  Social  History Social History   Tobacco Use  . Smoking status: Former Smoker    Packs/day: 1.50    Years: 26.00    Pack years: 39.00    Types: Cigarettes  . Smokeless tobacco: Former Engineer, water Use Topics  . Alcohol use: Yes    Alcohol/week: 0.0 oz    Comment: 2 x/week  . Drug use: No     Allergies   Acetaminophen and Shellfish allergy   Review of Systems Review of Systems  Constitutional:       Per HPI, otherwise negative  HENT:       Per HPI, otherwise negative  Respiratory:       Per HPI, otherwise negative  Cardiovascular:       Per HPI, otherwise negative  Gastrointestinal: Negative for vomiting.  Endocrine:       Negative aside from HPI  Genitourinary:       Neg aside from HPI   Musculoskeletal:       Per HPI, otherwise negative  Skin: Negative.   Neurological: Negative for syncope.     Physical Exam Updated Vital Signs BP (!) 171/72   Pulse (!) 102   Temp 98.1 F (36.7 C) (Oral)   Resp 16   Ht 5\' 7"  (1.702 m)   Wt 129.3 kg (285 lb)   SpO2 90%   BMI 44.64 kg/m   Physical Exam  Constitutional: He is oriented to person, place, and time. He appears well-developed. No distress.  HENT:  Head: Normocephalic and atraumatic.  Mouth/Throat: Oropharynx is clear and moist.  Eyes: Conjunctivae and EOM are normal.  Cardiovascular: Normal rate and regular rhythm.  Pulmonary/Chest: Tachypnea noted. He has decreased breath sounds.  Abdominal: He exhibits no distension.  Musculoskeletal: He exhibits no edema.  Neurological: He is alert and oriented to person, place, and time.  Skin: Skin is warm and dry.  Psychiatric: He has a normal mood and affect.  Nursing note and vitals reviewed.    ED Treatments / Results  Labs (all labs ordered are listed, but only abnormal results are displayed) Labs Reviewed  CBC WITH DIFFERENTIAL/PLATELET - Abnormal; Notable for the following components:      Result Value   WBC 17.8 (*)    Neutro Abs 12.8 (*)    Monocytes  Absolute 1.6 (*)    All other components within normal limits  BASIC METABOLIC PANEL - Abnormal; Notable for the following components:   Potassium 3.2 (*)    Chloride 93 (*)    CO2 36 (*)    All other components within normal limits  CBG MONITORING, ED - Abnormal; Notable for the following components:   Glucose-Capillary 111 (*)    All other components within normal limits  CULTURE, EXPECTORATED SPUTUM-ASSESSMENT  TROPONIN I    EKG None  Radiology Dg Chest 2 View  Result Date: 04/22/2018 CLINICAL DATA:  42 y/o M; shortness of breath and cough. History of COPD, diabetes, and smoking. EXAM: CHEST - 2 VIEW COMPARISON:  09/14/2015 chest radiograph FINDINGS: Stable normal cardiac silhouette given projection and technique. Diffuse reticular opacities similar to prior chest radiograph. No consolidation, effusion, or pneumothorax. Bones are unremarkable. IMPRESSION: Diffuse reticular opacities possibly representing atypical pneumonia or bronchitis. No consolidation. Electronically Signed   By: Mitzi Hansen M.D.   On: 04/22/2018 17:28    Procedures Procedures (including critical care time)  Medications Ordered in ED Medications  albuterol (PROVENTIL,VENTOLIN) solution continuous neb (0 mg/hr Nebulization Stopped 04/22/18 1917)  cefTRIAXone (ROCEPHIN) 1 g in sodium chloride 0.9 % 100 mL IVPB (1 g Intravenous New Bag/Given 04/22/18 1936)  nicotine (NICODERM CQ - dosed in mg/24 hours) patch 21 mg (21 mg Transdermal Patch Applied 04/22/18 1858)  albuterol (PROVENTIL) (2.5 MG/3ML) 0.083% nebulizer solution 5 mg (5 mg Nebulization Given 04/22/18 1637)  methylPREDNISolone sodium succinate (SOLU-MEDROL) 125 mg/2 mL injection 125 mg (125 mg Intravenous Given 04/22/18 1753)  azithromycin (ZITHROMAX) 500 mg in sodium chloride 0.9 % 250 mL IVPB (0 mg Intravenous Stopped 04/22/18 1932)  0.9 %  sodium chloride infusion ( Intravenous New Bag/Given 04/22/18 1828)     Initial Impression / Assessment  and Plan / ED Course  I have reviewed the triage vital signs and the nursing notes.  Pertinent labs & imaging results that were available during my care of  the patient were reviewed by me and considered in my medical decision making (see chart for details).    With hypoxia on room air after the initial evaluation the patient started on supplemental oxygen, 2 L, nasal cannula, received albuterol treatment.  Update, Patient slightly better  7:39 PM Patient awake and alert, remains hypoxic, though with 3 L nasal cannula feels slightly better.  This 42 year old male with history of smoking, COPD presents with ongoing cough, dyspnea, is found to have increased work of breathing, hypoxia on room air, and x-ray concerning for pneumonia. In spite of initial albuterol, the patient continued to receive/require supplemental oxygen for appropriate oxygenation. Patient's mentating was appropriate, he was afebrile. He received steroids, antibiotics, albuterol, was admitted for further evaluation and management. Final Clinical Impressions(s) / ED Diagnoses  Atypical pneumonia Hypoxia   Gerhard MunchLockwood, Josua Ferrebee, MD 04/22/18 1940

## 2018-04-22 NOTE — ED Notes (Signed)
PT SUPPOSE TO BE ON 0XYGEN 2-3L [place] HOWEVER NON COMPLIANT. SOB WITH CHEST PAIN X 10 DAYS. PRODUCTIVE GREEN COUGH. SLIGHT FEVER.

## 2018-04-22 NOTE — ED Notes (Signed)
ED TO INPATIENT HANDOFF REPORT  Name/Age/Gender Steven Dudley 42 y.o. male  Code Status    Code Status Orders  (From admission, onward)        Start     Ordered   04/22/18 2040  Full code  Continuous     04/22/18 2043    Code Status History    Date Active Date Inactive Code Status Order ID Comments User Context   09/14/2015 1558 09/17/2015 1629 Full Code [id number]  Radene Gunning, NP Inpatient   09/25/2014 4098 09/25/2014 1510 Full Code [id number]  Marda Stalker, PA-C Inpatient      Home/SNF/Other Home  Chief Complaint COPD   Level of Care/Admitting Diagnosis ED Disposition    ED Disposition Condition [place]: [facility] [[id number]]  Level of Care: Telemetry [5]  Admit to tele based on following criteria: Monitor for Ischemic changes  Diagnosis: Acute exacerbation of chronic obstructive pulmonary disease (COPD) [facility]) [562130]  Admitting Physician: Rise Patience [phone]  Attending Physician: Rise Patience Lei.Right  Estimated length of stay: past midnight tomorrow  Certification:: I certify this patient will need inpatient services for at least 2 midnights  PT Class (Do Not Modify): Inpatient [101]  PT Acc Code (Do Not Modify): Private [1]       Medical History Past Medical History:  Diagnosis Date  . Acid reflux   . Acute respiratory failure ([place])   . Alcohol abuse   . Anxiety   . Asthma    daily and prn inhalers  . Chondromalacia of right patella 09/2014  . COPD (chronic obstructive pulmonary disease) ([place])   . Diabetes mellitus without complication ([place])    TYPE II  . Gastritis   . Gastritis   . History of traumatic brain injury 2006  . Hyperglycemia   . Hypertension    states under control with med., has been on med. x 1 yr.  . Meniscal cyst 09/2014   right knee  . Morbid obesity ([place])   . Obesity   . Smokers' cough (HCC)     Allergies Allergies  Allergen Reactions  . Acetaminophen Other  (See Comments)    GI BLEEDING Other reaction(s): Other GI BLEEDING  . Shellfish Allergy Nausea And Vomiting, Nausea Only and Other (See Comments)    Headache, heat flashes. Other reaction(s): Other Headache, heat flashes.    IV Location/Drains/Wounds Patient Lines/Drains/Airways Status   Active Line/Drains/Airways    Name:   Placement date:   Placement time:   Site:   Days:   Peripheral IV 04/22/18 Left Forearm   04/22/18    1750    Forearm   less than 1          Labs/Imaging Results for orders placed or performed during the hospital encounter of 04/22/18 (from the past 48 hour(s))  CBG monitoring, ED     Status: Abnormal   Collection Time: 04/22/18  4:42 PM  Result Value Ref Range   Glucose-Capillary 111 (H) 70 - 99 mg/dL  CBC with Differential     Status: Abnormal   Collection Time: 04/22/18  5:51 PM  Result Value Ref Range   WBC 17.8 (H) 4.0 - 10.5 K/uL   RBC 5.33 4.22 - 5.81 MIL/uL   Hemoglobin 16.6 13.0 - 17.0 g/dL   HCT 49.6 39.0 - 52.0 %   MCV 93.1 78.0 - 100.0 fL   MCH 31.1 26.0 - 34.0 pg   MCHC 33.5 30.0 - 36.0  g/dL   RDW 13.0 11.5 - 15.5 %   Platelets 321 150 - 400 K/uL   Neutrophils Relative % 72 %   Lymphocytes Relative 18 %   Monocytes Relative 9 %   Eosinophils Relative 1 %   Basophils Relative 0 %   Neutro Abs 12.8 (H) 1.7 - 7.7 K/uL   Lymphs Abs 3.2 0.7 - 4.0 K/uL   Monocytes Absolute 1.6 (H) 0.1 - 1.0 K/uL   Eosinophils Absolute 0.2 0.0 - 0.7 K/uL   Basophils Absolute 0.0 0.0 - 0.1 K/uL   Smear Review MORPHOLOGY UNREMARKABLE     Comment: Performed at [facility], [place] [address] [place], [place] [address]  Basic metabolic panel     Status: Abnormal   Collection Time: 04/22/18  5:51 PM  Result Value Ref Range   Sodium 141 135 - 145 mmol/L   Potassium 3.2 (L) 3.5 - 5.1 mmol/L   Chloride 93 (L) 98 - 111 mmol/L    Comment: Please note change in reference range.   CO2 36 (H) 22 - 32 mmol/L   Glucose, Bld 89 70 - 99 mg/dL     Comment: Please note change in reference range.   BUN 10 6 - 20 mg/dL    Comment: Please note change in reference range.   Creatinine, Ser 0.81 0.61 - 1.24 mg/dL   Calcium 9.0 8.9 - 10.3 mg/dL   GFR calc non Af Amer >60 >60 mL/min   GFR calc Af Amer >60 >60 mL/min    Comment: (NOTE) The eGFR has been calculated using the CKD EPI equation. This calculation has not been validated in all clinical situations. eGFR's persistently <60 mL/min signify possible Chronic Kidney Disease.    Anion gap 12 5 - 15    Comment: Performed at [facility], [place] [address], [place], [place] [address]  Troponin I     Status: None   Collection Time: 04/22/18  5:51 PM  Result Value Ref Range   Troponin I <0.03 <0.03 ng/mL    Comment: Performed at [facility], [place] [address] [place], [place] [address]  Culture, expectorated sputum-assessment     Status: None   Collection Time: 04/22/18  8:12 PM  Result Value Ref Range   Specimen Description SPUTUM    Special Requests NONE    Sputum evaluation      Sputum specimen not acceptable for testing.  Please recollect.   RESULT CALLED TO, READ BACK BY AND VERIFIED WITH: J HOLT,RN 04/22/18 2109 RHOLMES Performed at [facility], [place] [address] [place], [place] [address]    Report Status 04/22/2018 FINAL    Dg Chest 2 View  Result Date: 04/22/2018 CLINICAL DATA:  42 y/o M; shortness of breath and cough. History of COPD, diabetes, and smoking. EXAM: CHEST - 2 VIEW COMPARISON:  09/14/2015 chest radiograph FINDINGS: Stable normal cardiac silhouette given projection and technique. Diffuse reticular opacities similar to prior chest radiograph. No consolidation, effusion, or pneumothorax. Bones are unremarkable. IMPRESSION: Diffuse reticular opacities possibly representing atypical pneumonia or bronchitis. No consolidation. Electronically Signed   By: Kristine Garbe M.D.   On: 04/22/2018 17:28    Pending  Labs Unresulted Labs (From admission, onward)   Start     Ordered   04/29/18 0500  Creatinine, serum  (enoxaparin (LOVENOX)    CrCl >/= 30 ml/min)  Weekly,   R    Comments:  while on enoxaparin therapy    04/22/18 2043  04/23/18 3664  Basic metabolic panel  Tomorrow morning,   R     04/22/18 2043   04/23/18 0500  CBC  Tomorrow morning,   R     04/22/18 2043   04/22/18 2042  Brain natriuretic peptide  Once,   R     04/22/18 2043   04/22/18 2041  Magnesium  Once,   R     04/22/18 2043   04/22/18 2041  TSH  Once,   R     04/22/18 2043   04/22/18 2041  Troponin I  Now then every 6 hours,   R     04/22/18 2043   04/22/18 2040  CBC  (enoxaparin (LOVENOX)    CrCl >/= 30 ml/min)  Once,   R    Comments:  Baseline for enoxaparin therapy IF NOT ALREADY DRAWN.  Notify MD if PLT < 100 K.    04/22/18 2043   04/22/18 2040  Creatinine, serum  (enoxaparin (LOVENOX)    CrCl >/= 30 ml/min)  Once,   R    Comments:  Baseline for enoxaparin therapy IF NOT ALREADY DRAWN.    04/22/18 2043   04/22/18 2039  Legionella Pneumophila Serogp 1 Ur Ag  Once,   R     04/22/18 2043   04/22/18 2038  Culture, sputum-assessment  Once,   R     04/22/18 2043   04/22/18 2038  Gram stain  Once,   R     04/22/18 2043   04/22/18 2038  Strep pneumoniae urinary antigen  Once,   R     04/22/18 2043   04/22/18 2038  HIV antibody (Routine Testing)  Once,   R     04/22/18 2043      Vitals/Pain Today's Vitals   04/22/18 1930 04/22/18 2000 04/22/18 2030 04/22/18 2034  BP: (!) 171/72 (!) 159/67 (!) 146/80 (!) 162/81  Pulse: (!) 102 98 95 (!) 102  Resp: 16 19 (!) 21 20  Temp:      TempSrc:      SpO2: 90% 93% 90% (!) 89%  Weight:      Height:      PainSc:        Isolation Precautions No active isolations  Medications Medications  nicotine (NICODERM CQ - dosed in mg/24 hours) patch 21 mg (21 mg Transdermal Patch Applied 04/22/18 1858)  traMADol (ULTRAM) tablet 50 mg (has no administration in time range)   aspirin chewable tablet 81 mg (has no administration in time range)  atorvastatin (LIPITOR) tablet 20 mg (has no administration in time range)  furosemide (LASIX) tablet 20 mg (has no administration in time range)  losartan (COZAAR) tablet 50 mg (has no administration in time range)  ondansetron (ZOFRAN) tablet 4 mg (has no administration in time range)    Or  ondansetron (ZOFRAN) injection 4 mg (has no administration in time range)  cefTRIAXone (ROCEPHIN) 1 g in sodium chloride 0.9 % 100 mL IVPB (has no administration in time range)  azithromycin (ZITHROMAX) 500 mg in sodium chloride 0.9 % 250 mL IVPB (has no administration in time range)  insulin aspart (novoLOG) injection 0-9 Units (has no administration in time range)  enoxaparin (LOVENOX) injection 40 mg (has no administration in time range)  albuterol (PROVENTIL) (2.5 MG/3ML) 0.083% nebulizer solution 2.5 mg (has no administration in time range)  albuterol (PROVENTIL) (2.5 MG/3ML) 0.083% nebulizer solution 2.5 mg (has no administration in time range)  ipratropium (ATROVENT) nebulizer solution 0.5 mg (has no administration  in time range)  budesonide (PULMICORT) nebulizer solution 0.25 mg (has no administration in time range)  hydrALAZINE (APRESOLINE) injection 10 mg (has no administration in time range)  thiamine (VITAMIN B-1) tablet 100 mg (has no administration in time range)  methylPREDNISolone sodium succinate (SOLU-MEDROL) 40 mg/mL injection 40 mg (has no administration in time range)  potassium chloride SA (K-DUR,KLOR-CON) CR tablet 40 mEq (has no administration in time range)  albuterol (PROVENTIL) (2.5 MG/3ML) 0.083% nebulizer solution 5 mg (5 mg Nebulization Given 04/22/18 1637)  methylPREDNISolone sodium succinate (SOLU-MEDROL) 125 mg/2 mL injection 125 mg (125 mg Intravenous Given 04/22/18 1753)  cefTRIAXone (ROCEPHIN) 1 g in sodium chloride 0.9 % 100 mL IVPB (0 g Intravenous Stopped 04/22/18 2029)  azithromycin (ZITHROMAX) 500  mg in sodium chloride 0.9 % 250 mL IVPB (0 mg Intravenous Stopped 04/22/18 1932)  0.9 %  sodium chloride infusion ( Intravenous Stopped 04/22/18 2036)    Mobility walks

## 2018-04-23 LAB — GLUCOSE, CAPILLARY
GLUCOSE-CAPILLARY: 247 mg/dL — AB (ref 70–99)
GLUCOSE-CAPILLARY: 233 mg/dL — AB (ref 70–99)
Glucose-Capillary: 232 mg/dL — ABNORMAL HIGH (ref 70–99)
Glucose-Capillary: 240 mg/dL — ABNORMAL HIGH (ref 70–99)

## 2018-04-23 LAB — STREP PNEUMONIAE URINARY ANTIGEN: Strep Pneumo Urinary Antigen: NEGATIVE

## 2018-04-23 LAB — CBC
HEMATOCRIT: 50 % (ref 39.0–52.0)
Hemoglobin: 16.1 g/dL (ref 13.0–17.0)
MCH: 30.4 pg (ref 26.0–34.0)
MCHC: 32.2 g/dL (ref 30.0–36.0)
MCV: 94.3 fL (ref 78.0–100.0)
Platelets: 303 10*3/uL (ref 150–400)
RBC: 5.3 MIL/uL (ref 4.22–5.81)
RDW: 12.9 % (ref 11.5–15.5)
WBC: 16 10*3/uL — ABNORMAL HIGH (ref 4.0–10.5)

## 2018-04-23 LAB — BASIC METABOLIC PANEL
Anion gap: 11 (ref 5–15)
BUN: 9 mg/dL (ref 6–20)
CHLORIDE: 93 mmol/L — AB (ref 98–111)
CO2: 35 mmol/L — AB (ref 22–32)
CREATININE: 0.72 mg/dL (ref 0.61–1.24)
Calcium: 9 mg/dL (ref 8.9–10.3)
GFR calc non Af Amer: >60 mL/min (ref >60)
Glucose, Bld: 286 mg/dL — ABNORMAL HIGH (ref 70–99)
POTASSIUM: 4.2 mmol/L (ref 3.5–5.1)
Sodium: 139 mmol/L (ref 135–145)

## 2018-04-23 LAB — HEPATIC FUNCTION PANEL
ALBUMIN: 3.4 g/dL — AB (ref 3.5–5.0)
ALK PHOS: 79 U/L (ref 38–126)
ALT: 17 U/L (ref 0–44)
AST: 15 U/L (ref 15–41)
BILIRUBIN TOTAL: 0.1 mg/dL — AB (ref 0.3–1.2)
Bilirubin, Direct: 0.1 mg/dL (ref 0.0–0.2)
Indirect Bilirubin: 0 mg/dL — ABNORMAL LOW (ref 0.3–0.9)
TOTAL PROTEIN: 7.6 g/dL (ref 6.5–8.1)

## 2018-04-23 LAB — RESPIRATORY PANEL BY PCR
Adenovirus: NOT DETECTED
Bordetella pertussis: NOT DETECTED
CORONAVIRUS 229E-RVPPCR: NOT DETECTED
CORONAVIRUS HKU1-RVPPCR: NOT DETECTED
CORONAVIRUS NL63-RVPPCR: NOT DETECTED
CORONAVIRUS OC43-RVPPCR: NOT DETECTED
Chlamydophila pneumoniae: NOT DETECTED
INFLUENZA A-RVPPCR: NOT DETECTED
Influenza B: NOT DETECTED
MYCOPLASMA PNEUMONIAE-RVPPCR: NOT DETECTED
Metapneumovirus: NOT DETECTED
PARAINFLUENZA VIRUS 1-RVPPCR: NOT DETECTED
PARAINFLUENZA VIRUS 4-RVPPCR: NOT DETECTED
Parainfluenza Virus 2: NOT DETECTED
Parainfluenza Virus 3: NOT DETECTED
Respiratory Syncytial Virus: NOT DETECTED
Rhinovirus / Enterovirus: NOT DETECTED

## 2018-04-23 LAB — TROPONIN I
Troponin I: <0.03 ng/mL (ref <0.03)
Troponin I: <0.03 ng/mL (ref <0.03)

## 2018-04-23 LAB — PROCALCITONIN

## 2018-04-23 LAB — HEMOGLOBIN A1C
Hgb A1c MFr Bld: 7.8 % — ABNORMAL HIGH (ref 4.8–5.6)
MEAN PLASMA GLUCOSE: 177.16 mg/dL

## 2018-04-23 LAB — HIV ANTIBODY (ROUTINE TESTING W REFLEX): HIV Screen 4th Generation wRfx: NONREACTIVE

## 2018-04-23 MED ORDER — NICOTINE 21 MG/24HR TD PT24
21.0000 mg | MEDICATED_PATCH | Freq: Every day | TRANSDERMAL | Status: DC
  Administered 2018-04-23 – 2018-04-25 (×3): 21 mg via TRANSDERMAL
  Filled 2018-04-23 (×3): qty 1

## 2018-04-23 MED ORDER — SODIUM CHLORIDE 3 % IN NEBU
4.0000 mL | INHALATION_SOLUTION | Freq: Three times a day (TID) | RESPIRATORY_TRACT | Status: DC
  Administered 2018-04-24: 4 mL via RESPIRATORY_TRACT
  Filled 2018-04-23 (×3): qty 4

## 2018-04-23 NOTE — Progress Notes (Signed)
PROGRESS NOTE  Steven Dudley [id number] DOB: 02/22/1976 DOA: 04/22/2018 PCP: Macy MisBriscoe, Kim K, MD  HPI/Recap of past 24 hours: Steven Dudley is a 42 y.o. male with history of COPD diabetes mellitus, hypertension hyperlipidemia presents to the ER with complaint of shortness of breath and productive cough which is been ongoing for last 10 days.  Admitted for sepsis secondary to community-acquired pneumonia and acute hypoxic respiratory failure.  04/23/18: Patient seen and examined at his bedside.  Admits to productive cough with greenish sputum.      Assessment/Plan: Principal Problem:   Acute exacerbation of chronic obstructive pulmonary disease (COPD) (HCC) Active Problems:   Hypertension   Diabetes mellitus type 2 in obese (HCC)  Sepsis secondary to community-acquired pneumonia HR 107, RR 29, WBC 17 k on admission Continue ceftriaxone and azithromycin Started pulmonary toilet Continue duo nebs every 6 hours and every 2 hours as needed Continue hypersaline nebs Continue Mucinex 1200 mg p.o. twice daily Continue Robitussin 10 cc every 6 hours as needed for cough Continue IV Solu-Medrol 40 mg twice daily for wheezes Abstain from tobacco use  Acute hypoxic respiratory failure secondary to community-acquired pneumonia Management as stated above O2 supplementation to maintain O2 saturation 88 to 92%  COPD Appears to be in exacerbation secondary to community-acquired pneumonia Continue COPD medications  OSA, noncompliant with CPAP Start CPAP at night  Morbid obesity BMI 42 Recommend weight loss outpatient with regular physical activity and healthy dieting  Tobacco use disorder Admits to smoking 2 packs/day Nicotine patch 21 mg daily    Code Status: Full code  Family Communication: None at bedside  Disposition Plan: Home in 2 to 3 days   Consultants:  None  Procedures:  None  Antimicrobials:  IV azithromycin  IV ceftriaxone  DVT prophylaxis:  sq lovenox daily   Objective: Vitals:   04/23/18 0820 04/23/18 1136 04/23/18 1412 04/23/18 1512  BP:   122/65   Pulse:   83   Resp:   18   Temp:   97.6 F (36.4 C)   TempSrc:   Oral   SpO2: 92% 92% 94% 92%  Weight:      Height:        Intake/Output Summary (Last 24 hours) at 04/23/2018 1533 Last data filed at 04/23/2018 1454 Gross per 24 hour  Intake 1440 ml  Output -  Net 1440 ml   Filed Weights   04/22/18 1625 04/22/18 2336 04/23/18 0418  Weight: 129.3 kg (285 lb) 124 kg (273 lb 5.9 oz) 123.2 kg (271 lb 8 oz)    Exam:  . General: 42 y.o. year-old male well developed well nourished in no acute distress.  Alert and oriented x3. . Cardiovascular: Regular rate and rhythm with no rubs or gallops.  No thyromegaly or JVD noted.   Marland Kitchen. Respiratory: Diffuse rales bilaterally with mild expiratory wheezes anteriorly.  Good inspiratory effort.  Abdomen: Soft nontender nondistended with normal bowel sounds x4 quadrants. . Musculoskeletal: Trace lower extremity edema. 2/4 pulses in all 4 extremities. . Skin: No ulcerative lesions noted or rashes.  Tattoos throughout. Marland Kitchen. Psychiatry: Mood is appropriate for condition and setting   Data Reviewed: CBC: Recent Labs  Lab 04/22/18 1751 04/23/18 0521  WBC 17.8* 16.0*  NEUTROABS 12.8*  --   HGB 16.6 16.1  HCT 49.6 50.0  MCV 93.1 94.3  PLT 321 303   Basic Metabolic Panel: Recent Labs  Lab 04/22/18 1751 04/22/18 2222 04/23/18 0521  NA 141  --  139  K  3.2*  --  4.2  CL 93*  --  93*  CO2 36*  --  35*  GLUCOSE 89  --  286*  BUN 10  --  9  CREATININE 0.81  --  0.72  CALCIUM 9.0  --  9.0  MG  --  2.0  --    GFR: Estimated Creatinine Clearance: 154.3 mL/min (by C-G formula based on SCr of 0.72 mg/dL). Liver Function Tests: Recent Labs  Lab 04/23/18 0521  AST 15  ALT 17  ALKPHOS 79  BILITOT 0.1*  PROT 7.6  ALBUMIN 3.4*   No results for input(s): LIPASE, AMYLASE in the last 168 hours. No results for input(s): AMMONIA in  the last 168 hours. Coagulation Profile: No results for input(s): INR, PROTIME in the last 168 hours. Cardiac Enzymes: Recent Labs  Lab 04/22/18 1751 04/22/18 2222 04/23/18 0521 04/23/18 1008  TROPONINI <0.03 <0.03 <0.03 <0.03   BNP (last 3 results) No results for input(s): PROBNP in the last 8760 hours. HbA1C: No results for input(s): HGBA1C in the last 72 hours. CBG: Recent Labs  Lab 04/22/18 1642 04/22/18 2203 04/23/18 0738 04/23/18 1138  GLUCAP 111* 293* 232* 240*   Lipid Profile: No results for input(s): CHOL, HDL, LDLCALC, TRIG, CHOLHDL, LDLDIRECT in the last 72 hours. Thyroid Function Tests: Recent Labs    04/22/18 2222  TSH 0.826   Anemia Panel: No results for input(s): VITAMINB12, FOLATE, FERRITIN, TIBC, IRON, RETICCTPCT in the last 72 hours. Urine analysis:    Component Value Date/Time   COLORURINE AMBER (A) 09/14/2015 1645   APPEARANCEUR HAZY (A) 09/14/2015 1645   LABSPEC 1.021 09/14/2015 1645   PHURINE 6.0 09/14/2015 1645   GLUCOSEU NEGATIVE 09/14/2015 1645   HGBUR SMALL (A) 09/14/2015 1645   BILIRUBINUR NEGATIVE 09/14/2015 1645   KETONESUR NEGATIVE 09/14/2015 1645   PROTEINUR 100 (A) 09/14/2015 1645   UROBILINOGEN 0.2 08/11/2013 1315   NITRITE NEGATIVE 09/14/2015 1645   LEUKOCYTESUR NEGATIVE 09/14/2015 1645   Sepsis Labs: @LABRCNTIP (procalcitonin:4,lacticidven:4)  ) Recent Results (from the past 240 hour(s))  Culture, expectorated sputum-assessment     Status: None   Collection Time: 04/22/18  8:12 PM  Result Value Ref Range Status   Specimen Description SPUTUM  Final   Special Requests NONE  Final   Sputum evaluation   Final    Sputum specimen not acceptable for testing.  Please recollect.   RESULT CALLED TO, READ BACK BY AND VERIFIED WITH: J HOLT,RN 04/22/18 2109 RHOLMES Performed at [facility], [address] [place], [place] [address]    Report Status 04/22/2018 FINAL  Final      Studies: Dg Chest 2  View  Result Date: 04/22/2018 CLINICAL DATA:  42 y/o M; shortness of breath and cough. History of COPD, diabetes, and smoking. EXAM: CHEST - 2 VIEW COMPARISON:  09/14/2015 chest radiograph FINDINGS: Stable normal cardiac silhouette given projection and technique. Diffuse reticular opacities similar to prior chest radiograph. No consolidation, effusion, or pneumothorax. Bones are unremarkable. IMPRESSION: Diffuse reticular opacities possibly representing atypical pneumonia or bronchitis. No consolidation. Electronically Signed   By: Mitzi Hansen M.D.   On: 04/22/2018 17:28    Scheduled Meds: . aspirin  81 mg Oral Daily  . atorvastatin  20 mg Oral q1800  . budesonide (PULMICORT) nebulizer solution  0.25 mg Nebulization BID  . enoxaparin (LOVENOX) injection  60 mg Subcutaneous QHS  . furosemide  20 mg Oral BID  . insulin aspart  0-9 Units Subcutaneous TID WC  .  ipratropium-albuterol  3 mL Nebulization Q4H  . losartan  50 mg Oral Daily  . methylPREDNISolone (SOLU-MEDROL) injection  40 mg Intravenous Q12H  . nicotine  21 mg Transdermal Once  . nicotine  21 mg Transdermal Daily  . thiamine  100 mg Oral Daily    Continuous Infusions: . azithromycin    . cefTRIAXone (ROCEPHIN)  IV       LOS: 1 day     Darlin Drop, MD [facility] Pager [phone]  If 7PM-7AM, please contact night-coverage [email or web address] Password [facility] 04/23/2018, 3:33 PM

## 2018-04-24 ENCOUNTER — Inpatient Hospital Stay (HOSPITAL_COMMUNITY): Payer: Self-pay

## 2018-04-24 DIAGNOSIS — I10 Essential (primary) hypertension: Secondary | ICD-10-CM

## 2018-04-24 LAB — BASIC METABOLIC PANEL
Anion gap: 8 (ref 5–15)
BUN: 14 mg/dL (ref 6–20)
CHLORIDE: 93 mmol/L — AB (ref 98–111)
CO2: 39 mmol/L — AB (ref 22–32)
CREATININE: 0.81 mg/dL (ref 0.61–1.24)
Calcium: 9.1 mg/dL (ref 8.9–10.3)
GFR calc Af Amer: >60 mL/min (ref >60)
GFR calc non Af Amer: >60 mL/min (ref >60)
GLUCOSE: 220 mg/dL — AB (ref 70–99)
Potassium: 4.5 mmol/L (ref 3.5–5.1)
SODIUM: 140 mmol/L (ref 135–145)

## 2018-04-24 LAB — CBC
HCT: 49.1 % (ref 39.0–52.0)
HEMOGLOBIN: 15.3 g/dL (ref 13.0–17.0)
MCH: 30.1 pg (ref 26.0–34.0)
MCHC: 31.2 g/dL (ref 30.0–36.0)
MCV: 96.5 fL (ref 78.0–100.0)
Platelets: 338 10*3/uL (ref 150–400)
RBC: 5.09 MIL/uL (ref 4.22–5.81)
RDW: 13.1 % (ref 11.5–15.5)
WBC: 23.2 10*3/uL — ABNORMAL HIGH (ref 4.0–10.5)

## 2018-04-24 LAB — ECHOCARDIOGRAM COMPLETE
Height: 67 in
Weight: 4361.6 oz

## 2018-04-24 LAB — GLUCOSE, CAPILLARY
GLUCOSE-CAPILLARY: 287 mg/dL — AB (ref 70–99)
GLUCOSE-CAPILLARY: 250 mg/dL — AB (ref 70–99)
GLUCOSE-CAPILLARY: 155 mg/dL — AB (ref 70–99)
Glucose-Capillary: 197 mg/dL — ABNORMAL HIGH (ref 70–99)

## 2018-04-24 LAB — BRAIN NATRIURETIC PEPTIDE: B Natriuretic Peptide: 139.1 pg/mL — ABNORMAL HIGH (ref 0.0–100.0)

## 2018-04-24 LAB — LEGIONELLA PNEUMOPHILA SEROGP 1 UR AG: L. PNEUMOPHILA SEROGP 1 UR AG: NEGATIVE

## 2018-04-24 MED ORDER — PREDNISONE 20 MG PO TABS
40.0000 mg | ORAL_TABLET | Freq: Every day | ORAL | Status: DC
  Administered 2018-04-25: 40 mg via ORAL
  Filled 2018-04-24: qty 2

## 2018-04-24 MED ORDER — PERFLUTREN LIPID MICROSPHERE
1.0000 mL | INTRAVENOUS | Status: AC | PRN
  Filled 2018-04-24: qty 10

## 2018-04-24 MED ORDER — AZITHROMYCIN 250 MG PO TABS
500.0000 mg | ORAL_TABLET | Freq: Every day | ORAL | Status: DC
  Administered 2018-04-24: 500 mg via ORAL
  Filled 2018-04-24: qty 2

## 2018-04-24 MED ORDER — IPRATROPIUM-ALBUTEROL 0.5-2.5 (3) MG/3ML IN SOLN
3.0000 mL | Freq: Four times a day (QID) | RESPIRATORY_TRACT | Status: DC
  Administered 2018-04-24 – 2018-04-25 (×4): 3 mL via RESPIRATORY_TRACT
  Filled 2018-04-24 (×4): qty 3

## 2018-04-24 MED ORDER — PERFLUTREN LIPID MICROSPHERE
INTRAVENOUS | Status: AC
  Filled 2018-04-24: qty 10

## 2018-04-24 MED ORDER — SODIUM CHLORIDE 3 % IN NEBU
4.0000 mL | INHALATION_SOLUTION | Freq: Two times a day (BID) | RESPIRATORY_TRACT | Status: DC
  Administered 2018-04-24: 4 mL via RESPIRATORY_TRACT
  Filled 2018-04-24 (×3): qty 4

## 2018-04-24 NOTE — Evaluation (Signed)
Physical Therapy Evaluation & Discharge Patient Details Name: March Steyer MRN: [id number] DOB: 30-Oct-1975 Today's Date: 04/24/2018   History of Present Illness  Steven Dudley is a 42 y.o. male with history of COPD diabetes mellitus, hypertension hyperlipidemia, admitted with COPD exacerbation/possible pnuemonia.  Clinical Impression  Patient presents with mobility at baseline and is independent with ambulation, stairs, and standing balance tasks.  Noted significant desaturation ambulating on RA.  See separate note for documentation.  No further skilled PT needs.  Will sign off.     Follow Up Recommendations No PT follow up    Equipment Recommendations  None recommended by PT    Recommendations for Other Services       Precautions / Restrictions Precautions Precaution Comments: O2 dependent      Mobility  Bed Mobility Overal bed mobility: Independent                Transfers Overall transfer level: Independent                  Ambulation/Gait Ambulation/Gait assistance: Independent Gait Distance (Feet): 250 Feet Assistive device: None Gait Pattern/deviations: WFL(Within Functional Limits)     General Gait Details: SpO2 on RA 75% with ambulation, 91% on 4L O2  Stairs Stairs: Yes Stairs assistance: Modified independent (Device/Increase time) Stair Management: Alternating pattern;One rail Right Number of Stairs: 4    Wheelchair Mobility    Modified Rankin (Stroke Patients Only)       Balance Overall balance assessment: Independent                                           Pertinent Vitals/Pain Pain Assessment: No/denies pain    Home Living Family/patient expects to be discharged to:: Private residence Living Arrangements: Spouse/significant other;Children Available Help at Discharge: Family Type of Home: House Home Access: Stairs to enter   Secretary/administrator of Steps: 1 Home Layout: One level Home  Equipment: Other (comment) Additional Comments: nebulizer, pulsoximier, CBG monitor    Prior Function Level of Independence: Independent         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 [employer]        Extremity/Trunk Assessment   Upper Extremity Assessment Upper Extremity Assessment: Overall WFL for tasks assessed    Lower Extremity Assessment Lower Extremity Assessment: Overall WFL for tasks assessed       Communication   Communication: No difficulties  Cognition Arousal/Alertness: Awake/alert Behavior During Therapy: WFL for tasks assessed/performed Overall Cognitive Status: Within Functional Limits for tasks assessed                                        General Comments      Exercises     Assessment/Plan    PT Assessment Patent does not need any further PT services  PT Problem List         PT Treatment Interventions      PT Goals (Current goals can be found in the Care Plan section)  Acute Rehab PT Goals PT Goal Formulation: All assessment and education complete, DC therapy    Frequency     Barriers to discharge        Co-evaluation  AM-PAC PT "6 Clicks" Daily Activity  Outcome Measure Difficulty turning over in bed (including adjusting bedclothes, sheets and blankets)?: None Difficulty moving from lying on back to sitting on the side of the bed? : None Difficulty sitting down on and standing up from a chair with arms (e.g., wheelchair, bedside commode, etc,.)?: None Help needed moving to and from a bed to chair (including a wheelchair)?: None Help needed walking in hospital room?: None Help needed climbing 3-5 steps with a railing? : A Little 6 Click Score: 23    End of Session Equipment Utilized During Treatment: Oxygen Activity Tolerance: Patient tolerated treatment well Patient left: with family/visitor present;with nursing/sitter in room;Other (comment)(in bathroom to shower)         Time: [id number] PT Time Calculation (min) (ACUTE ONLY): 12 min   Charges:   PT Evaluation $PT Eval Low Complexity: 1 Low     PT G CodesSheran Lawless:       Cyndi Wynn, [place] CarolinaPT [phone] 04/24/2018   Elray Mcgregorynthia Wynn 04/24/2018, 11:17 AM

## 2018-04-24 NOTE — Progress Notes (Signed)
*  PRELIMINARY RESULTS* Echocardiogram 2D Echocardiogram has been performed with Definity given by nurse.  Stacey DrainWhite, Laney Bagshaw J 04/24/2018, 4:02 PM

## 2018-04-24 NOTE — Progress Notes (Signed)
Inpatient Diabetes Program Recommendations  AACE/ADA: New Consensus Statement on Inpatient Glycemic Control (2015)  Target Ranges:  Prepandial:   less than 140 mg/dL      Peak postprandial:   less than 180 mg/dL (1-2 hours)      Critically ill patients:  140 - 180 mg/dL   Lab Results  Component Value Date   GLUCAP 287 (H) 04/24/2018   HGBA1C 7.8 (H) 04/23/2018    Review of Glycemic Control Results for Steven GoonBARBIERI, Inez (MRN [id number]) as of 04/24/2018 12:22  Ref. Range 04/23/2018 17:12 04/23/2018 20:59 04/24/2018 07:43 04/24/2018 11:42  Glucose-Capillary Latest Ref Range: 70 - 99 mg/dL 469247 (H) 629233 (H) 528197 (H) 287 (H)   Diabetes history: DM2 Outpatient Diabetes medications: Glipizide 5 mg + metformin 1 gm bid Current orders for Inpatient glycemic control: Novolog senstive correction tid  Inpatient Diabetes Program Recommendations:   While oral medications held: - Lantus 25 units daily Noted steroids tapering.  Thank you, Billy FischerJudy E. Ryon Layton, RN, MSN, CDE  Diabetes Coordinator Inpatient Glycemic Control Team Team Pager [phone] (8am-5pm) 04/24/2018 12:27 PM

## 2018-04-24 NOTE — Progress Notes (Signed)
Pt declined cpap tonight stating it makes him cough more.  Pt remains on 4lnc.  Cpap machine remains in room on standby.  Pt was advised that RT is available all night and was encouraged to call, should he change his mind.

## 2018-04-24 NOTE — Care Management Note (Signed)
Case Management Note  Patient Details  Name: Steven GoonMatthew Dudley MRN: [id number] Date of Birth: 1976/03/25  Subjective/Objective:   42 y/o m admitted w/Acute exac of COPD. From hme. No health insurance, or pcp-provided w/pcp listing-encouraged [facility], also given $4 [facility] med list. Noted 02 sats-will monitor if needed @ home.                Action/Plan:d/c plan home.   Expected Discharge Date:  04/25/18               Expected Discharge Plan:  Home/Self Care  In-House Referral:     Discharge planning Services  CM Consult, Indigent Health Clinic  Post Acute Care Choice:    Choice offered to:     DME Arranged:    DME Agency:     HH Arranged:    HH Agency:     Status of Service:  In process, will continue to follow  If discussed at Long Length of Stay Meetings, dates discussed:    Additional Comments:  Lanier ClamMahabir, [name], RN 04/24/2018, 1:11 PM

## 2018-04-24 NOTE — Progress Notes (Signed)
Pt placed on CPAP at 00:32.  Pt stated he doesn't wear one at home but was willing to try it out here.  Upon arriving for breathing treatment at 04:26 Pt was sleeping with his nasal cannula on.  Pt stated he was unable to tolerate CPAP.

## 2018-04-24 NOTE — Progress Notes (Signed)
SATURATION QUALIFICATIONS: (This note is used to comply with regulatory documentation for home oxygen)  Patient Saturations on Room Air at Rest = --%  Patient Saturations on Room Air while Ambulating = 75%  Patient Saturations on 4 Liters of oxygen while Ambulating = 91%  Please briefly explain why patient needs home oxygen:  Patient with significant hypoxia on RA with ambulation.  Will need home O2 upon d/c.  Marfayndi Dez Stauffer, [place] [phone] 04/24/2018

## 2018-04-24 NOTE — Progress Notes (Signed)
PROGRESS NOTE  Steven Dudley ZOX:[id number] DOB: 04-Nov-1975 DOA: 04/22/2018 PCP: Macy Mis, MD  HPI/Recap of past 24 hours: Steven Dudley is a 42 y.o. male with history of COPD diabetes mellitus, hypertension hyperlipidemia presents to the ER with complaint of shortness of breath and productive cough which is been ongoing for last 10 days.  Admitted for sepsis secondary to community-acquired pneumonia and acute hypoxic respiratory failure.  04/24/2018: Seen and examined at bedside.  No new complaints.  Admits to productive cough which is improving.    Assessment/Plan: Principal Problem:   Acute exacerbation of chronic obstructive pulmonary disease (COPD) (HCC) Active Problems:   Hypertension   Diabetes mellitus type 2 in obese (HCC)  Sepsis secondary to community-acquired pneumonia HR 107, RR 29, WBC 17 k on admission Continue ceftriaxone and p.o. azithromycin Continue pulmonary toilet Continue duo nebs every 6 hours and every 2 hours as needed Continue hypersaline nebs Continue Mucinex 1200 mg p.o. twice daily Continue Robitussin 10 cc every 6 hours as needed for cough Completed IV Solu-Medrol 40 mg twice daily for wheezes Start p.o. prednisone 40 mg daily Abstain from tobacco use  Acute hypoxic respiratory failure secondary to community-acquired pneumonia Management as stated above O2 supplementation to maintain O2 saturation 88 to 92% Obtain home O2 evaluation prior to discharge  Acute COPD exacerbation secondary to community-acquired pneumonia  Appears to be in exacerbation secondary to community-acquired pneumonia Continue COPD medications  OSA, noncompliant with CPAP Continue CPAP at night  Morbid obesity BMI 42 Recommend weight loss outpatient with regular physical activity and healthy dieting  Tobacco use disorder Admits to smoking 2 packs/day Nicotine patch 21 mg daily    Code Status: Full code  Family Communication: None at  bedside  Disposition Plan: To home in 1 to 2 days   Consultants:  None  Procedures:  None  Antimicrobials:  P.o. azithromycin  IV ceftriaxone  DVT prophylaxis:  sq lovenox daily   Objective: Vitals:   04/24/18 0527 04/24/18 0900 04/24/18 1242 04/24/18 1427  BP: 124/80  133/82   Pulse: 69 73 69 71  Resp: 20 18  18   Temp: 98.9 F (37.2 C)  98 F (36.7 C)   TempSrc:   Oral   SpO2: 95% 92% 97% 94%  Weight: 123.7 kg (272 lb 9.6 oz)     Height:        Intake/Output Summary (Last 24 hours) at 04/24/2018 1923 Last data filed at 04/24/2018 1300 Gross per 24 hour  Intake 965.83 ml  Output 1625 ml  Net -659.17 ml   Filed Weights   04/22/18 2336 04/23/18 0418 04/24/18 0527  Weight: 124 kg (273 lb 5.9 oz) 123.2 kg (271 lb 8 oz) 123.7 kg (272 lb 9.6 oz)    Exam:  . General: 42 y.o. year-old male obese in no acute distress.  Alert and oriented x3.   . Cardiovascular: Regular rate and rhythm with no rubs or gallops.  No thyromegaly or JVD noted. Marland Kitchen Respiratory: Diffuse rales bilaterally with minimal expiratory wheezes anteriorly.  Good inspiratory effort.   . Abdomen: Soft nontender nondistended with normal bowel sounds x4 quadrants. . Musculoskeletal: Trace lower extremity edema. 2/4 pulses in all 4 extremities. . Skin: No ulcerative lesions noted or rashes.  Tattoos throughout. Marland Kitchen Psychiatry: Mood is appropriate for condition and setting   Data Reviewed: CBC: Recent Labs  Lab 04/22/18 1751 04/23/18 0521 04/24/18 0545  WBC 17.8* 16.0* 23.2*  NEUTROABS 12.8*  --   --  HGB 16.6 16.1 15.3  HCT 49.6 50.0 49.1  MCV 93.1 94.3 96.5  PLT 321 303 338   Basic Metabolic Panel: Recent Labs  Lab 04/22/18 1751 04/22/18 2222 04/23/18 0521 04/24/18 0545  NA 141  --  139 140  K 3.2*  --  4.2 4.5  CL 93*  --  93* 93*  CO2 36*  --  35* 39*  GLUCOSE 89  --  286* 220*  BUN 10  --  9 14  CREATININE 0.81  --  0.72 0.81  CALCIUM 9.0  --  9.0 9.1  MG  --  2.0  --   --     GFR: Estimated Creatinine Clearance: 152.8 mL/min (by C-G formula based on SCr of 0.81 mg/dL). Liver Function Tests: Recent Labs  Lab 04/23/18 0521  AST 15  ALT 17  ALKPHOS 79  BILITOT 0.1*  PROT 7.6  ALBUMIN 3.4*   No results for input(s): LIPASE, AMYLASE in the last 168 hours. No results for input(s): AMMONIA in the last 168 hours. Coagulation Profile: No results for input(s): INR, PROTIME in the last 168 hours. Cardiac Enzymes: Recent Labs  Lab 04/22/18 1751 04/22/18 2222 04/23/18 0521 04/23/18 1008  TROPONINI <0.03 <0.03 <0.03 <0.03   BNP (last 3 results) No results for input(s): PROBNP in the last 8760 hours. HbA1C: Recent Labs    04/23/18 0521  HGBA1C 7.8*   CBG: Recent Labs  Lab 04/23/18 1712 04/23/18 2059 04/24/18 0743 04/24/18 1142 04/24/18 1713  GLUCAP 247* 233* 197* 287* 250*   Lipid Profile: No results for input(s): CHOL, HDL, LDLCALC, TRIG, CHOLHDL, LDLDIRECT in the last 72 hours. Thyroid Function Tests: Recent Labs    04/22/18 2222  TSH 0.826   Anemia Panel: No results for input(s): VITAMINB12, FOLATE, FERRITIN, TIBC, IRON, RETICCTPCT in the last 72 hours. Urine analysis:    Component Value Date/Time   COLORURINE AMBER (A) 09/14/2015 1645   APPEARANCEUR HAZY (A) 09/14/2015 1645   LABSPEC 1.021 09/14/2015 1645   PHURINE 6.0 09/14/2015 1645   GLUCOSEU NEGATIVE 09/14/2015 1645   HGBUR SMALL (A) 09/14/2015 1645   BILIRUBINUR NEGATIVE 09/14/2015 1645   KETONESUR NEGATIVE 09/14/2015 1645   PROTEINUR 100 (A) 09/14/2015 1645   UROBILINOGEN 0.2 08/11/2013 1315   NITRITE NEGATIVE 09/14/2015 1645   LEUKOCYTESUR NEGATIVE 09/14/2015 1645   Sepsis Labs: @LABRCNTIP (procalcitonin:4,lacticidven:4)  ) Recent Results (from the past 240 hour(s))  Culture, expectorated sputum-assessment     Status: None   Collection Time: 04/22/18  8:12 PM  Result Value Ref Range Status   Specimen Description SPUTUM  Final   Special Requests NONE  Final    Sputum evaluation   Final    Sputum specimen not acceptable for testing.  Please recollect.   RESULT CALLED TO, READ BACK BY AND VERIFIED WITH: J HOLT,RN 04/22/18 2109 RHOLMES Performed at [facility], [address] [place], [place] [address]    Report Status 04/22/2018 FINAL  Final  Respiratory Panel by PCR     Status: None   Collection Time: 04/23/18 12:23 PM  Result Value Ref Range Status   Adenovirus NOT DETECTED NOT DETECTED Final   Coronavirus 229E NOT DETECTED NOT DETECTED Final   Coronavirus HKU1 NOT DETECTED NOT DETECTED Final   Coronavirus NL63 NOT DETECTED NOT DETECTED Final   Coronavirus OC43 NOT DETECTED NOT DETECTED Final   Metapneumovirus NOT DETECTED NOT DETECTED Final   Rhinovirus / Enterovirus NOT DETECTED NOT DETECTED Final   Influenza A NOT  DETECTED NOT DETECTED Final   Influenza B NOT DETECTED NOT DETECTED Final   Parainfluenza Virus 1 NOT DETECTED NOT DETECTED Final   Parainfluenza Virus 2 NOT DETECTED NOT DETECTED Final   Parainfluenza Virus 3 NOT DETECTED NOT DETECTED Final   Parainfluenza Virus 4 NOT DETECTED NOT DETECTED Final   Respiratory Syncytial Virus NOT DETECTED NOT DETECTED Final   Bordetella pertussis NOT DETECTED NOT DETECTED Final   Chlamydophila pneumoniae NOT DETECTED NOT DETECTED Final   Mycoplasma pneumoniae NOT DETECTED NOT DETECTED Final    Comment: Performed at [facility], [address], [place], [place] [address]      Studies: No results found.  Scheduled Meds: . aspirin  81 mg Oral Daily  . atorvastatin  20 mg Oral q1800  . azithromycin  500 mg Oral q1800  . budesonide (PULMICORT) nebulizer solution  0.25 mg Nebulization BID  . enoxaparin (LOVENOX) injection  60 mg Subcutaneous QHS  . furosemide  20 mg Oral BID  . insulin aspart  0-9 Units Subcutaneous TID WC  . ipratropium-albuterol  3 mL Nebulization Q6H  . losartan  50 mg Oral Daily  . nicotine  21 mg Transdermal Daily  . [START ON 04/25/2018]  predniSONE  40 mg Oral Q breakfast  . sodium chloride HYPERTONIC  4 mL Nebulization BID  . thiamine  100 mg Oral Daily    Continuous Infusions: . cefTRIAXone (ROCEPHIN)  IV Stopped (04/24/18 1809)     LOS: 2 days     Darlin Drop, MD [facility] Pager [phone]  If 7PM-7AM, please contact night-coverage [email or web address] Password [facility] 04/24/2018, 7:23 PM

## 2018-04-25 ENCOUNTER — Inpatient Hospital Stay (HOSPITAL_COMMUNITY): Payer: Self-pay

## 2018-04-25 ENCOUNTER — Encounter (HOSPITAL_COMMUNITY): Payer: Self-pay | Admitting: Radiology

## 2018-04-25 DIAGNOSIS — J00 Acute nasopharyngitis [common cold]: Secondary | ICD-10-CM

## 2018-04-25 LAB — CBC
HEMATOCRIT: 50.5 % (ref 39.0–52.0)
HEMOGLOBIN: 15.5 g/dL (ref 13.0–17.0)
MCH: 29.9 pg (ref 26.0–34.0)
MCHC: 30.7 g/dL (ref 30.0–36.0)
MCV: 97.3 fL (ref 78.0–100.0)
Platelets: 337 10*3/uL (ref 150–400)
RBC: 5.19 MIL/uL (ref 4.22–5.81)
RDW: 13.2 % (ref 11.5–15.5)
WBC: 16.1 10*3/uL — ABNORMAL HIGH (ref 4.0–10.5)

## 2018-04-25 LAB — BASIC METABOLIC PANEL
ANION GAP: 13 (ref 5–15)
BUN: 21 mg/dL — ABNORMAL HIGH (ref 6–20)
CO2: 37 mmol/L — ABNORMAL HIGH (ref 22–32)
Calcium: 8.6 mg/dL — ABNORMAL LOW (ref 8.9–10.3)
Chloride: 92 mmol/L — ABNORMAL LOW (ref 98–111)
Creatinine, Ser: 0.83 mg/dL (ref 0.61–1.24)
GFR calc Af Amer: >60 mL/min (ref >60)
GLUCOSE: 155 mg/dL — AB (ref 70–99)
POTASSIUM: 3.7 mmol/L (ref 3.5–5.1)
Sodium: 142 mmol/L (ref 135–145)

## 2018-04-25 LAB — GLUCOSE, CAPILLARY
Glucose-Capillary: 148 mg/dL — ABNORMAL HIGH (ref 70–99)
Glucose-Capillary: 138 mg/dL — ABNORMAL HIGH (ref 70–99)

## 2018-04-25 MED ORDER — LOSARTAN POTASSIUM 100 MG PO TABS: 50.0000 mg | ORAL_TABLET | Freq: Every day | ORAL | 1 refills | Status: DC

## 2018-04-25 MED ORDER — CEFDINIR 300 MG PO CAPS: 300.0000 mg | ORAL_CAPSULE | Freq: Two times a day (BID) | ORAL | 0 refills | Status: AC

## 2018-04-25 MED ORDER — SALINE SPRAY 0.65 % NA SOLN: 1.0000 | NASAL | 2 refills | Status: DC | PRN

## 2018-04-25 MED ORDER — FUROSEMIDE 20 MG PO TABS: 20.0000 mg | ORAL_TABLET | Freq: Two times a day (BID) | ORAL | 1 refills | Status: AC

## 2018-04-25 MED ORDER — IOPAMIDOL (ISOVUE-370) INJECTION 76%
INTRAVENOUS | Status: AC
  Filled 2018-04-25: qty 100

## 2018-04-25 MED ORDER — PREDNISONE 20 MG PO TABS: 40.0000 mg | ORAL_TABLET | Freq: Every day | ORAL | 0 refills | Status: AC

## 2018-04-25 MED ORDER — THIAMINE HCL 100 MG PO TABS: 100.0000 mg | ORAL_TABLET | Freq: Every day | ORAL | 2 refills | Status: DC

## 2018-04-25 MED ORDER — CEFDINIR 300 MG PO CAPS
300.0000 mg | ORAL_CAPSULE | Freq: Two times a day (BID) | ORAL | Status: DC
  Filled 2018-04-25: qty 1

## 2018-04-25 MED ORDER — GUAIFENESIN-DM 100-10 MG/5ML PO SYRP: 10.0000 mL | ORAL_SOLUTION | Freq: Four times a day (QID) | ORAL | 0 refills | Status: DC | PRN

## 2018-04-25 MED ORDER — IOPAMIDOL (ISOVUE-370) INJECTION 76%
100.0000 mL | Freq: Once | INTRAVENOUS | Status: AC | PRN
  Administered 2018-04-25: 100 mL via INTRAVENOUS

## 2018-04-25 MED ORDER — SALINE SPRAY 0.65 % NA SOLN
1.0000 | NASAL | Status: DC | PRN
  Administered 2018-04-25: 1 via NASAL
  Filled 2018-04-25: qty 44

## 2018-04-25 NOTE — Care Management Note (Signed)
Case Management Note  Patient Details  Name: Sampson GoonMatthew Knust MRN: [id number] Date of Birth: 1975/12/31  Subjective/Objective: Noted home 02 placed. [facility] rep Clydie BraunKaren aware & wil deliver home 02 travel tank to rm prior d/c. No further CM needs.                   Action/Plan:d/c home w/home 02.   Expected Discharge Date:  04/25/18               Expected Discharge Plan:  Home/Self Care  In-House Referral:     Discharge planning Services  CM Consult, [facility]  Post Acute Care Choice:    Choice offered to:     DME Arranged:    DME Agency:     HH Arranged:    HH Agency:     Status of Service:  In process, will continue to follow  If discussed at Long Length of Stay Meetings, dates discussed:    Additional Comments:  Lanier ClamMahabir, Jayonna Meyering, RN 04/25/2018, 3:55 PM

## 2018-04-25 NOTE — Discharge Summary (Signed)
Physician Discharge Summary  [name] MRN: [id number] DOB/AGE: 11/23/1975 42 y.o.  PCP: Katherina Mires, MD   Admit date: 04/22/2018 Discharge date: 04/25/2018  Discharge Diagnoses:    Principal Problem:   Acute exacerbation of chronic obstructive pulmonary disease (COPD) ([facility]) Active Problems:   Hypertension   Diabetes mellitus type 2 in obese [facility])    Follow-up recommendations Follow-up with PCP in 3-5 days , including all  additional recommended appointments as below Follow-up CBC, magnesium,CMP in 3-5 days       Allergies as of 04/25/2018      Reactions   Acetaminophen Other (See Comments)   GI BLEEDING Other reaction(s): Other GI BLEEDING   Shellfish Allergy Nausea And Vomiting, Nausea Only, Other (See Comments)   Headache, heat flashes. Other reaction(s): Other Headache, heat flashes.      Medication List    TAKE these medications   albuterol 108 (90 Base) MCG/ACT inhaler Commonly known as:  PROVENTIL HFA;VENTOLIN HFA Inhale 2 puffs into the lungs every 4 (four) hours as needed for wheezing or shortness of breath.   aspirin 81 MG chewable tablet Chew 81 mg by mouth daily.   atorvastatin 20 MG tablet Commonly known as:  LIPITOR Take 20 mg by mouth daily.   busPIRone 10 MG tablet Commonly known as:  BUSPAR Take 1 tablet (10 mg total) by mouth 2 (two) times daily.   cefdinir 300 MG capsule Commonly known as:  OMNICEF Take 1 capsule (300 mg total) by mouth every 12 (twelve) hours for 7 days.   DAYQUIL/NYQUIL COLD/FLU RELIEF PO Take 2 capsules by mouth daily as needed (for cold). Reported on 01/04/2016   Fluticasone-Salmeterol 250-50 MCG/DOSE Aepb Commonly known as:  ADVAIR Inhale 1 puff into the lungs 2 (two) times daily.   furosemide 20 MG tablet Commonly known as:  LASIX Take 1 tablet (20 mg total) by mouth 2 (two) times daily.   glipiZIDE 5 MG 24 hr tablet Commonly known as:  GLUCOTROL XL Take 5 mg by mouth daily with breakfast.    guaiFENesin-dextromethorphan 100-10 MG/5ML syrup Commonly known as:  ROBITUSSIN DM Take 10 mLs by mouth every 6 (six) hours as needed for cough.   losartan 100 MG tablet Commonly known as:  COZAAR Take 0.5 tablets (50 mg total) by mouth daily.   metFORMIN 500 MG tablet Commonly known as:  GLUCOPHAGE Take 1 tablet (500 mg total) by mouth 2 (two) times daily with a meal. What changed:  how much to take   GLUCOPHAGE XR 500 MG 24 hr tablet Generic drug:  metFORMIN Take 2,000 mg by mouth See admin instructions. Take 1000 mg twice daily with a meal What changed:  Another medication with the same name was changed. Make sure you understand how and when to take each.   methocarbamol 500 MG tablet Commonly known as:  ROBAXIN Take 1 tablet (500 mg total) by mouth 3 (three) times daily between meals as needed.   mometasone-formoterol 100-5 MCG/ACT Aero Commonly known as:  DULERA Inhale 2 puffs into the lungs 2 (two) times daily.   nicotine 21 mg/24hr patch Commonly known as:  NICODERM CQ - dosed in mg/24 hours Place 1 patch (21 mg total) onto the skin daily.   omeprazole 20 MG capsule Commonly known as:  PRILOSEC Take 1 capsule (20 mg total) by mouth daily.   oxyCODONE 5 MG immediate release tablet Commonly known as:  ROXICODONE Take 1 tablet (5 mg total) by mouth every 4 (four) hours as needed  for severe pain.   predniSONE 20 MG tablet Commonly known as:  DELTASONE Take 2 tablets (40 mg total) by mouth daily with breakfast for 5 days. Start taking on:  04/26/2018   promethazine 25 MG tablet Commonly known as:  PHENERGAN Take 1 tablet (25 mg total) by mouth every 6 (six) hours as needed for nausea or vomiting.   ranitidine 150 MG tablet Commonly known as:  ZANTAC Take 150 mg by mouth daily. Reported on 01/04/2016   sildenafil 20 MG tablet Commonly known as:  REVATIO Take 40-100 mg by mouth daily as needed (sexual activity).   sodium chloride 0.65 % Soln nasal  spray Commonly known as:  OCEAN Place 1 spray into both nostrils as needed for congestion (nose irritation).   sucralfate 1 GM/10ML suspension Commonly known as:  CARAFATE Take 10 mLs (1 g total) by mouth 4 (four) times daily -  with meals and at bedtime.   thiamine 100 MG tablet Take 1 tablet (100 mg total) by mouth daily. Start taking on:  04/26/2018   traMADol 50 MG tablet Commonly known as:  ULTRAM Take 1 tablet (50 mg total) by mouth every 6 (six) hours as needed.            Durable Medical Equipment  (From admission, onward)        Start     Ordered   04/25/18 0000  For home use only DME oxygen    Question Answer Comment  Mode or (Route) Nasal cannula   Liters per Minute 2   Frequency Continuous (stationary and portable oxygen unit needed)   Oxygen conserving device Yes   Oxygen delivery system Gas      04/25/18 1158       Discharge Condition:    Discharge Instructions Get Medicines reviewed and adjusted: Please take all your medications with you for your next visit with your Primary MD  Please request your Primary MD to go over all hospital tests and procedure/radiological results at the follow up, please ask your Primary MD to get all Hospital records sent to his/her office.  If you experience worsening of your admission symptoms, develop shortness of breath, life threatening emergency, suicidal or homicidal thoughts you must seek medical attention immediately by calling 911 or calling your MD immediately  if symptoms less severe.  You must read complete instructions/literature along with all the possible adverse reactions/side effects for all the Medicines you take and that have been prescribed to you. Take any new Medicines after you have completely understood and accpet all the possible adverse reactions/side effects.   Do not drive when taking Pain medications.   Do not take more than prescribed Pain, Sleep and Anxiety Medications  Special  Instructions: If you have smoked or chewed Tobacco  in the last 2 yrs please stop smoking, stop any regular Alcohol  and or any Recreational drug use.  Wear Seat belts while driving.  Please note  You were cared for by a hospitalist during your hospital stay. Once you are discharged, your primary care physician will handle any further medical issues. Please note that NO REFILLS for any discharge medications will be authorized once you are discharged, as it is imperative that you return to your primary care physician (or establish a relationship with a primary care physician if you do not have one) for your aftercare needs so that they can reassess your need for medications and monitor your lab values.  Discharge Instructions    For home  use only DME oxygen   Complete by:  As directed    Mode or (Route):  Nasal cannula   Liters per Minute:  2   Frequency:  Continuous (stationary and portable oxygen unit needed)   Oxygen conserving device:  Yes   Oxygen delivery system:  Gas       Allergies  Allergen Reactions  . Acetaminophen Other (See Comments)    GI BLEEDING Other reaction(s): Other GI BLEEDING  . Shellfish Allergy Nausea And Vomiting, Nausea Only and Other (See Comments)    Headache, heat flashes. Other reaction(s): Other Headache, heat flashes.      Disposition: Discharge disposition: 01-Home or Self Care      consults none       Significant Diagnostic Studies:  Dg Chest 2 View  Result Date: 04/22/2018 CLINICAL DATA:  42 y/o M; shortness of breath and cough. History of COPD, diabetes, and smoking. EXAM: CHEST - 2 VIEW COMPARISON:  09/14/2015 chest radiograph FINDINGS: Stable normal cardiac silhouette given projection and technique. Diffuse reticular opacities similar to prior chest radiograph. No consolidation, effusion, or pneumothorax. Bones are unremarkable. IMPRESSION: Diffuse reticular opacities possibly representing atypical pneumonia or bronchitis. No  consolidation. Electronically Signed   By: Kristine Garbe M.D.   On: 04/22/2018 17:28   Ct Angio Chest Pe W Or Wo Contrast  Result Date: 04/25/2018 CLINICAL DATA:  COPD, hypertension, acute accessory shin EXAM: CT ANGIOGRAPHY CHEST WITH CONTRAST TECHNIQUE: Multidetector CT imaging of the chest was performed using the standard protocol during bolus administration of intravenous contrast. Multiplanar CT image reconstructions and MIPs were obtained to evaluate the vascular anatomy. CONTRAST:  191m ISOVUE-370 IOPAMIDOL (ISOVUE-370) INJECTION 76% COMPARISON:  None. FINDINGS: Cardiovascular: Satisfactory opacification of the pulmonary arteries to the segmental level. No evidence of pulmonary embolism. Normal heart size. No pericardial effusion. Normal caliber thoracic aorta. No thoracic aortic dissection. Mediastinum/Nodes: No enlarged mediastinal, hilar, or axillary lymph nodes. Thyroid gland, trachea, and esophagus demonstrate no significant findings. Lungs/Pleura: Lungs are clear. No pleural effusion or pneumothorax. Upper Abdomen: No acute abnormality. Musculoskeletal: No chest wall abnormality. No acute or significant osseous findings. Review of the MIP images confirms the above findings. IMPRESSION: 1. No evidence pulmonary embolus. 2. No acute cardiopulmonary disease. Electronically Signed   By: HKathreen Devoid  On: 04/25/2018 14:53    echocardiogram  LV EF: 50% -   55%  ------------------------------------------------------------------- Indications:      Abnormal EKG 794.31.  ------------------------------------------------------------------- History:   PMH:  Acquired from the patient and from the patient&'s chart.  Chronic obstructive pulmonary disease.  PMH:  Obesity. Risk factors:  Tobacco use disorder. Hypertension. Diabetes mellitus.  ------------------------------------------------------------------- Study Conclusions  - Left ventricle: The cavity size was mildly dilated.  Wall   thickness was increased in a pattern of mild LVH. Systolic   function was normal. The estimated ejection fraction was in the   range of 50% to 55%. Wall motion was normal; there were no   regional wall motion abnormalities. Left ventricular diastolic   function parameters were normal. - Mitral valve: Calcified annulus.  Impressions:  - Technically difficult; definity used; normal LV function; mild   LVE and LVH.      Filed Weights   04/22/18 2336 04/23/18 0418 04/24/18 0527  Weight: 124 kg (273 lb 5.9 oz) 123.2 kg (271 lb 8 oz) 123.7 kg (272 lb 9.6 oz)     Microbiology: Recent Results (from the past 240 hour(s))  Culture, expectorated sputum-assessment  Status: None   Collection Time: 04/22/18  8:12 PM  Result Value Ref Range Status   Specimen Description SPUTUM  Final   Special Requests NONE  Final   Sputum evaluation   Final    Sputum specimen not acceptable for testing.  Please recollect.   RESULT CALLED TO, READ BACK BY AND VERIFIED WITH: J HOLT,RN 04/22/18 2109 RHOLMES Performed at [facility], [place] [address] [place], [place] [address]    Report Status 04/22/2018 FINAL  Final  Respiratory Panel by PCR     Status: None   Collection Time: 04/23/18 12:23 PM  Result Value Ref Range Status   Adenovirus NOT DETECTED NOT DETECTED Final   Coronavirus 229E NOT DETECTED NOT DETECTED Final   Coronavirus HKU1 NOT DETECTED NOT DETECTED Final   Coronavirus NL63 NOT DETECTED NOT DETECTED Final   Coronavirus OC43 NOT DETECTED NOT DETECTED Final   Metapneumovirus NOT DETECTED NOT DETECTED Final   Rhinovirus / Enterovirus NOT DETECTED NOT DETECTED Final   Influenza A NOT DETECTED NOT DETECTED Final   Influenza B NOT DETECTED NOT DETECTED Final   Parainfluenza Virus 1 NOT DETECTED NOT DETECTED Final   Parainfluenza Virus 2 NOT DETECTED NOT DETECTED Final   Parainfluenza Virus 3 NOT DETECTED NOT DETECTED Final   Parainfluenza Virus 4 NOT DETECTED NOT  DETECTED Final   Respiratory Syncytial Virus NOT DETECTED NOT DETECTED Final   Bordetella pertussis NOT DETECTED NOT DETECTED Final   Chlamydophila pneumoniae NOT DETECTED NOT DETECTED Final   Mycoplasma pneumoniae NOT DETECTED NOT DETECTED Final    Comment: Performed at [facility], [place] [address] [place], [name] [address]       Blood Culture    Component Value Date/Time   SDES SPUTUM 04/22/2018 2012   SPECREQUEST NONE 04/22/2018 2012   CULT NO GROWTH Performed at [facility] 08/11/2013 1315   REPTSTATUS 04/22/2018 FINAL 04/22/2018 2012      Labs: Results for orders placed or performed during the hospital encounter of 04/22/18 (from the past 48 hour(s))  Glucose, capillary     Status: Abnormal   Collection Time: 04/23/18  5:12 PM  Result Value Ref Range   Glucose-Capillary 247 (H) 70 - 99 mg/dL  Glucose, capillary     Status: Abnormal   Collection Time: 04/23/18  8:59 PM  Result Value Ref Range   Glucose-Capillary 233 (H) 70 - 99 mg/dL  CBC     Status: Abnormal   Collection Time: 04/24/18  5:45 AM  Result Value Ref Range   WBC 23.2 (H) 4.0 - 10.5 K/uL   RBC 5.09 4.22 - 5.81 MIL/uL   Hemoglobin 15.3 13.0 - 17.0 g/dL   HCT 49.1 39.0 - 52.0 %   MCV 96.5 78.0 - 100.0 fL   MCH 30.1 26.0 - 34.0 pg   MCHC 31.2 30.0 - 36.0 g/dL   RDW 13.1 11.5 - 15.5 %   Platelets 338 150 - 400 K/uL    Comment: Performed at [facility], [place] [address] [place],  [address]  Basic metabolic panel     Status: Abnormal   Collection Time: 04/24/18  5:45 AM  Result Value Ref Range   Sodium 140 135 - 145 mmol/L   Potassium 4.5 3.5 - 5.1 mmol/L   Chloride 93 (L) 98 - 111 mmol/L    Comment: Please note change in reference range.   CO2 39 (H) 22 - 32 mmol/L   Glucose, Bld 220 (H)  70 - 99 mg/dL    Comment: Please note change in reference range.   BUN 14 6 - 20 mg/dL    Comment: Please note change in reference range.   Creatinine, Ser 0.81 0.61 - 1.24  mg/dL   Calcium 9.1 8.9 - 10.3 mg/dL   GFR calc non Af Amer >60 >60 mL/min   GFR calc Af Amer >60 >60 mL/min    Comment: (NOTE) The eGFR has been calculated using the CKD EPI equation. This calculation has not been validated in all clinical situations. eGFR's persistently <60 mL/min signify possible Chronic Kidney Disease.    Anion gap 8 5 - 15    Comment: Performed at [facility], [place] [address] [place], [place] [address]  Brain natriuretic peptide     Status: Abnormal   Collection Time: 04/24/18  5:45 AM  Result Value Ref Range   B Natriuretic Peptide 139.1 (H) 0.0 - 100.0 pg/mL    Comment: Performed at [facility], [place] [address] [place], [place] [address]  Glucose, capillary     Status: Abnormal   Collection Time: 04/24/18  7:43 AM  Result Value Ref Range   Glucose-Capillary 197 (H) 70 - 99 mg/dL  Glucose, capillary     Status: Abnormal   Collection Time: 04/24/18 11:42 AM  Result Value Ref Range   Glucose-Capillary 287 (H) 70 - 99 mg/dL  Glucose, capillary     Status: Abnormal   Collection Time: 04/24/18  5:13 PM  Result Value Ref Range   Glucose-Capillary 250 (H) 70 - 99 mg/dL  Glucose, capillary     Status: Abnormal   Collection Time: 04/24/18  9:03 PM  Result Value Ref Range   Glucose-Capillary 155 (H) 70 - 99 mg/dL  CBC     Status: Abnormal   Collection Time: 04/25/18  5:18 AM  Result Value Ref Range   WBC 16.1 (H) 4.0 - 10.5 K/uL   RBC 5.19 4.22 - 5.81 MIL/uL   Hemoglobin 15.5 13.0 - 17.0 g/dL   HCT 50.5 39.0 - 52.0 %   MCV 97.3 78.0 - 100.0 fL   MCH 29.9 26.0 - 34.0 pg   MCHC 30.7 30.0 - 36.0 g/dL   RDW 13.2 11.5 - 15.5 %   Platelets 337 150 - 400 K/uL    Comment: Performed at [facility], [place] [address] [place], [place] [address]  Basic metabolic panel     Status: Abnormal   Collection Time: 04/25/18  5:18 AM  Result Value Ref Range   Sodium 142 135 - 145 mmol/L   Potassium 3.7 3.5 - 5.1  mmol/L    Comment: DELTA CHECK NOTED REPEATED TO VERIFY    Chloride 92 (L) 98 - 111 mmol/L    Comment: Please note change in reference range.   CO2 37 (H) 22 - 32 mmol/L   Glucose, Bld 155 (H) 70 - 99 mg/dL    Comment: Please note change in reference range.   BUN 21 (H) 6 - 20 mg/dL    Comment: Please note change in reference range.   Creatinine, Ser 0.83 0.61 - 1.24 mg/dL   Calcium 8.6 (L) 8.9 - 10.3 mg/dL   GFR calc non Af Amer >60 >60 mL/min   GFR calc Af Amer >60 >60 mL/min    Comment: (NOTE) The eGFR has been calculated using the CKD EPI equation. This calculation has not been validated in all clinical situations. eGFR's persistently <60 mL/min signify possible  Chronic Kidney Disease.    Anion gap 13 5 - 15    Comment: Performed at [facility], [place] [address], [place], [place] [address]  Glucose, capillary     Status: Abnormal   Collection Time: 04/25/18  7:29 AM  Result Value Ref Range   Glucose-Capillary 148 (H) 70 - 99 mg/dL  Glucose, capillary     Status: Abnormal   Collection Time: 04/25/18 11:31 AM  Result Value Ref Range   Glucose-Capillary 138 (H) 70 - 99 mg/dL     Lipid Panel     Component Value Date/Time   CHOL 147 01/04/2016 1612   TRIG 184 (H) 01/04/2016 1612   HDL 34 (L) 01/04/2016 1612   CHOLHDL 4.3 01/04/2016 1612   VLDL 37 (H) 01/04/2016 1612   LDLCALC 76 01/04/2016 1612     Lab Results  Component Value Date   HGBA1C 7.8 (H) 04/23/2018   HGBA1C 6.4 (H) 01/04/2016   HGBA1C 6.6 (H) 09/14/2015     Lab Results  Component Value Date   LDLCALC 76 01/04/2016   CREATININE 0.83 04/25/2018     HPI Steven Dudley is a 42 y.o. male with history of COPD diabetes mellitus, hypertension hyperlipidemia presents to the ER with complaint of shortness of breath and productive cough which is been ongoing for last 10 days.  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  Has been having  discolored sputum.  Chest pain only on coughing.  Otherwise chest pain-free.  Denies any fever chills.  ED Course: The ER patient was found to be wheezing chest x-ray shows possibility of pneumonia patient admitted for COPD with possible pneumonia.  Blood pressure was elevated.    HOSPITAL COURSE:     Sepsis presume to be secondary to COPD exacerbation, no pneumonia on CT HR 107, RR 29, WBC 17 k on admission Started on ceftriaxone and p.o. azithromycin   pulmonary toilet   duo nebs every 6 hours and every 2 hours as needed   hypersaline nebs   Mucinex 1200 mg p.o. twice daily  Robitussin 10 cc every 6 hours as needed for cough Treated with IV Solu-Medrol 40 mg twice daily for wheezes, switch to prednisone taper  patient found to be 96% at rest, 83% with ambulation  also found to be noncompliant with CPap at night Patient set up for home oxygen 2 L continuous and encouraged to use CPAP   Acute hypoxic respiratory failure secondary to community-acquired pneumonia Management as stated above O2 supplementation to maintain O2 saturation 88 to 92%    Acute COPD exacerbation secondary to community-acquired pneumonia  Appears to be in exacerbation secondary to viral bronchitis Continue COPD medications  OSA, noncompliant with CPAP Continue CPAP at night  Morbid obesity BMI 42 Recommend weight loss outpatient with regular physical activity and healthy dieting  Tobacco use disorder Admits to smoking 2 packs/day Nicotine patch 21 mg daily provided      Discharge Exam:   Blood pressure (!) 151/93, pulse 64, temperature 97.8 F (36.6 C), temperature source Oral, resp. rate (!) 21, height _0  (1.702 m), weight 123.7 kg (272 lb 9.6 oz), SpO2 (!) 86 %. Respiratory: Bilateral expiratory wheeze and no crepitations. Cardiovascular: S1-S2 heard no murmurs appreciated. Abdomen: Soft nontender bowel sounds present. Musculoskeletal: No edema.  No joint effusion. Skin: No  rash.  Skin appears warm. Neurologic: Alert awake oriented to time place and person.  Moves all extremities.  Psychiatric: Appears normal per normal affect.        Follow-up Information    Katherina Mires, MD. Call.   Specialty:  Family Medicine Why:  Hospital follow-up in 3-5 days Contact information: [place] [address] [phone]           Signed: Reyne Dumas 04/25/2018, 3:06 PM      Time needed to  prevent discharge, discussed with the patient and family 74 minutes

## 2018-04-25 NOTE — Progress Notes (Signed)
Received call from nsg for home 02-informed nurse will need documented 02 sats-ra @ rest,ambulating without 02,& recovery with 02. Also  MD home 02 order with liter flow,freq,duration, & route. Then will contact [facility] for dme travel tank for home.

## 2018-04-25 NOTE — Progress Notes (Addendum)
SATURATION QUALIFICATIONS: (This note is used to comply with regulatory documentation for home oxygen)  Patient Saturations on Room Air at Rest = 96%  Patient Saturations on Room Air while Ambulating = 83%  Patient Saturations on 3 Liters of oxygen while Ambulating = 92%  Please briefly explain why patient needs home oxygen: pt has hx of COPD and requires O2 while ambulating.

## 2018-06-22 ENCOUNTER — Emergency Department (HOSPITAL_BASED_OUTPATIENT_CLINIC_OR_DEPARTMENT_OTHER): Payer: Self-pay

## 2018-06-22 ENCOUNTER — Other Ambulatory Visit: Payer: Self-pay

## 2018-06-22 ENCOUNTER — Emergency Department (HOSPITAL_BASED_OUTPATIENT_CLINIC_OR_DEPARTMENT_OTHER)
Admission: EM | Admit: 2018-06-22 | Discharge: 2018-06-22 | Disposition: A | Discharge status: Home / Self Care | Payer: Self-pay | Attending: Emergency Medicine | Admitting: Emergency Medicine

## 2018-06-22 ENCOUNTER — Encounter (HOSPITAL_BASED_OUTPATIENT_CLINIC_OR_DEPARTMENT_OTHER): Payer: Self-pay | Admitting: *Deleted

## 2018-06-22 DIAGNOSIS — Z7982 Long term (current) use of aspirin: Secondary | ICD-10-CM | POA: Insufficient documentation

## 2018-06-22 DIAGNOSIS — M79602 Pain in left arm: Secondary | ICD-10-CM

## 2018-06-22 DIAGNOSIS — J449 Chronic obstructive pulmonary disease, unspecified: Secondary | ICD-10-CM | POA: Insufficient documentation

## 2018-06-22 DIAGNOSIS — E119 Type 2 diabetes mellitus without complications: Secondary | ICD-10-CM | POA: Insufficient documentation

## 2018-06-22 DIAGNOSIS — Z87891 Personal history of nicotine dependence: Secondary | ICD-10-CM | POA: Insufficient documentation

## 2018-06-22 DIAGNOSIS — Z7984 Long term (current) use of oral hypoglycemic drugs: Secondary | ICD-10-CM | POA: Insufficient documentation

## 2018-06-22 DIAGNOSIS — I10 Essential (primary) hypertension: Secondary | ICD-10-CM | POA: Insufficient documentation

## 2018-06-22 DIAGNOSIS — Z79899 Other long term (current) drug therapy: Secondary | ICD-10-CM | POA: Insufficient documentation

## 2018-06-22 MED ORDER — IBUPROFEN 400 MG PO TABS
600.0000 mg | ORAL_TABLET | Freq: Once | ORAL | Status: AC
  Administered 2018-06-22: 600 mg via ORAL
  Filled 2018-06-22: qty 1

## 2018-06-22 MED ORDER — DICLOFENAC SODIUM 1 % TD GEL: 4.0000 g | Freq: Four times a day (QID) | TRANSDERMAL | 0 refills | Status: DC

## 2018-06-22 NOTE — Discharge Instructions (Addendum)
Please read and follow all provided instructions.  You have been seen today for left wrist pain  Tests performed today include: An x-ray of the affected area - does NOT show any broken bones or dislocations.  Vital signs. See below for your results today.   Home care instructions: -- *PRICE in the first 24-48 hours after injury: Protect (with brace, splint, sling), if given by your provider Rest Ice- Do not apply ice pack directly to your skin, place towel or similar between your skin and ice/ice pack. Apply ice for 20 min, then remove for 40 min while awake Compression- Wear brace, elastic bandage, splint as directed by your provider Elevate affected extremity above the level of your heart when not walking around for the first 24-48 hours   Follow-up instructions: Please follow-up with your primary care provider or the provided orthopedic physician (bone specialist) if you continue to have significant pain in 1 week. In this case you may have a more severe injury that requires further care.   Return instructions:  Please return if your toes or feet are numb or tingling, appear gray or blue, or you have severe pain (also elevate the leg and loosen splint or wrap if you were given one) Please return to the Emergency Department if you experience worsening symptoms.  Please return if you have any other emergent concerns. Additional Information:  Please use your oxygen at home. You were offered workup for you low oxygen levels today. You deferred at this time. Please return for any cough, chest pain, shortness of breath or concerning symptoms.   Your vital signs today were: BP 117/71 (BP Location: Right Arm)    Pulse 91    Resp 20    Ht 5\' 7"  (1.702 m)    Wt 122.5 kg    SpO2 (!) 89%    BMI 42.29 kg/m  If your blood pressure (BP) was elevated above 135/85 this visit, please have this repeated by your doctor within one month. ---------------

## 2018-06-22 NOTE — ED Triage Notes (Signed)
Pt reports working on a deck yesterday and a railing fell onto his left forearm. Abrasion and swelling noted. + radial pulse, dec rom.

## 2018-06-22 NOTE — ED Provider Notes (Signed)
[facility] EMERGENCY DEPARTMENT Provider Note   CSN: [id number] Arrival date & time: 06/22/18  1024     History   Chief Complaint Chief Complaint  Patient presents with  . Arm Pain    HPI Steven Dudley is a 42 y.o. male with a history of COPD, diabetes, gastritis, tobacco abuse who presents emergency department today for left wrist pain.  Patient reports that yesterday he was working on a railing when the railing fell and smacked him on his left wrist.  He is right-handed.  He is noted to have a small abrasion over the left wrist.  There is now swelling and bruising to the area.  He reports constant pain that is throbbing in nature worse with range of motion.  Patient does not take anything for symptoms.  He reports tingling but denies any numbness. Tetanus is up to date.   HPI  Past Medical History:  Diagnosis Date  . Acid reflux   . Acute respiratory failure (HCC)   . Alcohol abuse   . Anxiety   . Asthma    daily and prn inhalers  . Chondromalacia of right patella 09/2014  . COPD (chronic obstructive pulmonary disease) (HCC)   . Diabetes mellitus without complication (HCC)    TYPE II  . Gastritis   . Gastritis   . History of traumatic brain injury 2006  . Hyperglycemia   . Hypertension    states under control with med., has been on med. x 1 yr.  . Meniscal cyst 09/2014   right knee  . Morbid obesity (HCC)   . Obesity   . Smokers' cough [facility])     Patient Active Problem List   Diagnosis Date Noted  . Acute exacerbation of chronic obstructive pulmonary disease (COPD) (HCC) 04/22/2018  . Diabetes mellitus type 2 in obese (HCC) 04/22/2018  . Atypical pneumonia   . Diabetes (HCC) 01/11/2016  . Prediabetes 09/17/2015  . URI (upper respiratory infection) 09/14/2015  . Chest pain 09/14/2015  . Cough 09/14/2015  . Acute respiratory failure with hypoxia (HCC) 09/14/2015  . COPD exacerbation (HCC) 09/14/2015  . Tobacco use disorder 09/14/2015  .  Tachycardia 09/14/2015  . Hyperglycemia 09/14/2015  . Acid reflux   . Obesity   . Hypertension   . COPD (chronic obstructive pulmonary disease) (HCC)     Past Surgical History:  Procedure Laterality Date  . APPENDECTOMY    . FOREIGN BODY REMOVAL Left 02/27/2008   hand  . INCISION AND DRAINAGE ABSCESS Left 03/01/2008   hand  . IRRIGATION AND DEBRIDEMENT ABSCESS Left 02/27/2008   hand  . KNEE ARTHROSCOPY WITH MEDIAL MENISECTOMY Right 09/25/2014   Procedure: RIGHT KNEE SCOPE CHONDROPLASTY/MEDIAL MENISCECTOMY;  Surgeon: Loreta Ave, MD;  Location: [facility];  Service: Orthopedics;  Laterality: Right;  ANESTHESIA: GENERAL, KNEE BLOCK  . TENOSYNOVECTOMY Left 02/27/2008   flexor tenosynovectomy left hand  . TONSILLECTOMY AND ADENOIDECTOMY          Home Medications    Prior to Admission medications   Medication Sig Start Date End Date Taking? Authorizing Provider  albuterol (PROVENTIL HFA;VENTOLIN HFA) 108 (90 BASE) MCG/ACT inhaler Inhale 2 puffs into the lungs every 4 (four) hours as needed for wheezing or shortness of breath. Patient not taking: Reported on 01/04/2016 09/17/15   Christiane Ha, MD  aspirin 81 MG chewable tablet Chew 81 mg by mouth daily.    [provider]  atorvastatin (LIPITOR) 20 MG tablet Take 20 mg by  mouth daily.    [provider]  busPIRone (BUSPAR) 10 MG tablet Take 1 tablet (10 mg total) by mouth 2 (two) times daily. Patient not taking: Reported on 01/04/2016 09/17/15   Christiane Ha, MD  Fluticasone-Salmeterol (ADVAIR) 250-50 MCG/DOSE AEPB Inhale 1 puff into the lungs 2 (two) times daily.    [provider]  furosemide (LASIX) 20 MG tablet Take 1 tablet (20 mg total) by mouth 2 (two) times daily. 04/25/18   Richarda Overlie, MD  glipiZIDE (GLUCOTROL XL) 5 MG 24 hr tablet Take 5 mg by mouth daily with breakfast.    [provider]  guaiFENesin-dextromethorphan (ROBITUSSIN DM) 100-10 MG/5ML syrup Take  10 mLs by mouth every 6 (six) hours as needed for cough. 04/25/18   Richarda Overlie, MD  losartan (COZAAR) 100 MG tablet Take 0.5 tablets (50 mg total) by mouth daily. 04/25/18   Richarda Overlie, MD  metFORMIN (GLUCOPHAGE XR) 500 MG 24 hr tablet Take 2,000 mg by mouth See admin instructions. Take 1000 mg twice daily with a meal 02/01/18 02/01/19  [provider]  metFORMIN (GLUCOPHAGE) 500 MG tablet Take 1 tablet (500 mg total) by mouth 2 (two) times daily with a meal. Patient taking differently: Take 1,000 mg by mouth 2 (two) times daily with a meal.  10/08/15   Henrietta Hoover, NP  methocarbamol (ROBAXIN) 500 MG tablet Take 1 tablet (500 mg total) by mouth 3 (three) times daily between meals as needed. 12/16/16   Rolland Porter, MD  mometasone-formoterol [facility]) 100-5 MCG/ACT AERO Inhale 2 puffs into the lungs 2 (two) times daily. Patient not taking: Reported on 01/04/2016 09/17/15   Christiane Ha, MD  nicotine (NICODERM CQ - DOSED IN MG/24 HOURS) 21 mg/24hr patch Place 1 patch (21 mg total) onto the skin daily. Patient not taking: Reported on 01/04/2016 09/17/15   Christiane Ha, MD  omeprazole (PRILOSEC) 20 MG capsule Take 1 capsule (20 mg total) by mouth daily. 01/22/16   Tilden Fossa, MD  oxyCODONE (ROXICODONE) 5 MG immediate release tablet Take 1 tablet (5 mg total) by mouth every 4 (four) hours as needed for severe pain. 12/16/16   Rolland Porter, MD  promethazine (PHENERGAN) 25 MG tablet Take 1 tablet (25 mg total) by mouth every 6 (six) hours as needed for nausea or vomiting. 01/22/16   Tilden Fossa, MD  Pseudoeph-Doxylamine-DM-APAP (DAYQUIL/NYQUIL COLD/FLU RELIEF PO) Take 2 capsules by mouth daily as needed (for cold). Reported on 01/04/2016    [provider]  ranitidine (ZANTAC) 150 MG tablet Take 150 mg by mouth daily. Reported on 01/04/2016    [provider]  sildenafil (REVATIO) 20 MG tablet Take 40-100 mg by mouth daily as needed (sexual activity).    [provider]  sodium chloride (OCEAN) 0.65 % SOLN nasal spray Place 1 spray into both nostrils as needed for congestion (nose irritation). 04/25/18   Richarda Overlie, MD  sucralfate (CARAFATE) 1 GM/10ML suspension Take 10 mLs (1 g total) by mouth 4 (four) times daily -  with meals and at bedtime. 01/22/16   Tilden Fossa, MD  thiamine 100 MG tablet Take 1 tablet (100 mg total) by mouth daily. 04/26/18   Richarda Overlie, MD  traMADol (ULTRAM) 50 MG tablet Take 1 tablet (50 mg total) by mouth every 6 (six) hours as needed. 03/01/16   Alvira Monday, MD    Family History Family History  Problem Relation Age of Onset  . Stomach cancer Mother  Social History Social History   Tobacco Use  . Smoking status: Former Smoker    Packs/day: 1.50    Years: 26.00    Pack years: 39.00    Types: Cigarettes  . Smokeless tobacco: Former Engineer, waterUser  Substance Use Topics  . Alcohol use: Yes    Alcohol/week: 0.0 standard drinks    Comment: 2 x/week  . Drug use: No     Allergies   Acetaminophen and Shellfish allergy   Review of Systems Review of Systems  All other systems reviewed and are negative.    Physical Exam Updated Vital Signs BP 117/71 (BP Location: Right Arm)   Pulse 91   Resp 20   Ht 5\' 7"  (1.702 m)   Wt 122.5 kg   SpO2 (!) 89%   BMI 42.29 kg/m   Physical Exam  Constitutional: He appears well-developed and well-nourished.  HENT:  Head: Normocephalic and atraumatic.  Right Ear: External ear normal.  Left Ear: External ear normal.  Nose: Nose normal.  Mouth/Throat: Uvula is midline, oropharynx is clear and moist and mucous membranes are normal. No tonsillar exudate.  Eyes: Pupils are equal, round, and reactive to light. Right eye exhibits no discharge. Left eye exhibits no discharge. No scleral icterus.  Neck: Trachea normal. Neck supple. No spinous process tenderness present. No neck rigidity. Normal range of motion present.  Cardiovascular: Normal rate, regular rhythm and  intact distal pulses.  No murmur heard. Pulses:      Radial pulses are 2+ on the right side, and 2+ on the left side.       Dorsalis pedis pulses are 2+ on the right side, and 2+ on the left side.       Posterior tibial pulses are 2+ on the right side, and 2+ on the left side.  Non-pitting edema to lower extremities b/l. No calf ttp.   Pulmonary/Chest: Effort normal. He has wheezes (end expiratory). He exhibits no tenderness.  Abdominal: Soft. Bowel sounds are normal. There is no tenderness. There is no rebound and no guarding.  Musculoskeletal: He exhibits no edema.       Left elbow: Normal.       Left wrist: He exhibits tenderness.       Left forearm: Normal.       Left hand: He exhibits normal range of motion, no tenderness and normal capillary refill. Normal sensation noted. Normal strength noted.       Hands: No snuffbox ttp. Neurovascularly intact distally. Compartments soft above and below affected joint.   Lymphadenopathy:    He has no cervical adenopathy.  Neurological: He is alert. He has normal strength. No sensory deficit.  Skin: Skin is warm and dry. No rash noted. He is not diaphoretic. No erythema.  Psychiatric: He has a normal mood and affect.  Nursing note and vitals reviewed.    ED Treatments / Results  Labs (all labs ordered are listed, but only abnormal results are displayed) Labs Reviewed - No data to display  EKG None  Radiology Dg Forearm Left  Result Date: 06/22/2018 CLINICAL DATA:  Pt had a hand rail fall on his arm this am and is having posterior wrist pain and some numbness to mid forearm EXAM: LEFT FOREARM - 2 VIEW COMPARISON:  None. FINDINGS: There is no evidence of fracture or other focal bone lesions. Soft tissues are unremarkable. IMPRESSION: Negative. Electronically Signed   By: Bary RichardStan  Maynard M.D.   On: 06/22/2018 10:55   Dg Wrist Complete Left  Result Date: 06/22/2018 CLINICAL DATA:  Pt had a hand rail fall on his arm this am and is having  posterior wrist pain and some numbness to mid forearm EXAM: LEFT WRIST - COMPLETE 3+ VIEW COMPARISON:  None. FINDINGS: There is no evidence of fracture or dislocation. There is no evidence of arthropathy or other focal bone abnormality. Soft tissues are unremarkable. IMPRESSION: Negative. Electronically Signed   By: Bary Richard M.D.   On: 06/22/2018 10:56    Procedures Procedures (including critical care time)  Medications Ordered in ED Medications  ibuprofen (ADVIL,MOTRIN) tablet 600 mg (has no administration in time range)     Initial Impression / Assessment and Plan / ED Course  I have reviewed the triage vital signs and the nursing notes.  Pertinent labs & imaging results that were available during my care of the patient were reviewed by me and considered in my medical decision making (see chart for details).     42 y.o. male with left, nondominant wrist/hand pain after being hit by a metal object yesterday.  He does have a small abrasion over his left dorsal wrist.  His tetanus is up-to-date.  There is no evidence of tendon injury.  X-rays obtained and negative.  No fracture or dislocations.  Will treat conservatively. Pt history of gastritis.  Will avoid oral NSAIDs.  Will give Voltaren.  Recommended Tylenol otherwise for pain.  Referral to sports medicine given her as-needed basis. I advised the patient to follow-up with PCP this week. Specific return precautions discussed. Time was given for all questions to be answered. The patient verbalized understanding and agreement with plan. The patient appears safe for discharge home.  Review of vital signs patient was noted to be hypoxic.  He does have a history of COPD and tobacco abuse.  He states he is not compliant with his home oxygen and is supposed to wear 24/7.  He denies any current shortness of breath, cough or chest pain.  He does have mild wheezing and expiratory.  Recommended blood work, x-ray and further evaluation but patient  deferred at this time and states he does not wish for work-up.  He states understanding of risks and states he will follow-up with his primary care provider early next week. Patient case discussed with Dr. Criss Alvine who is in agreement with plan.  Final Clinical Impressions(s) / ED Diagnoses   Final diagnoses:  Left arm pain    ED Discharge Orders         Ordered    diclofenac sodium (VOLTAREN) 1 % GEL  4 times daily     06/22/18 1348           Jacinto Halim, Cordelia Poche 06/22/18 1407    Pricilla Loveless, MD 06/22/18 1457

## 2019-02-17 ENCOUNTER — Emergency Department (HOSPITAL_COMMUNITY): Payer: Medicaid Other

## 2019-02-17 ENCOUNTER — Encounter (HOSPITAL_COMMUNITY): Payer: Self-pay | Admitting: Emergency Medicine

## 2019-02-17 ENCOUNTER — Emergency Department (HOSPITAL_COMMUNITY)
Admission: EM | Admit: 2019-02-17 | Discharge: 2019-02-17 | Discharge status: Against Medical Advice | Payer: Medicaid Other | Source: Home / Self Care | Attending: Emergency Medicine | Admitting: Emergency Medicine

## 2019-02-17 ENCOUNTER — Other Ambulatory Visit: Payer: Self-pay

## 2019-02-17 DIAGNOSIS — Z5320 Procedure and treatment not carried out because of patient's decision for unspecified reasons: Secondary | ICD-10-CM | POA: Insufficient documentation

## 2019-02-17 DIAGNOSIS — R69 Illness, unspecified: Secondary | ICD-10-CM | POA: Insufficient documentation

## 2019-02-17 DIAGNOSIS — F17210 Nicotine dependence, cigarettes, uncomplicated: Secondary | ICD-10-CM | POA: Insufficient documentation

## 2019-02-17 DIAGNOSIS — I509 Heart failure, unspecified: Secondary | ICD-10-CM | POA: Insufficient documentation

## 2019-02-17 DIAGNOSIS — J45909 Unspecified asthma, uncomplicated: Secondary | ICD-10-CM | POA: Insufficient documentation

## 2019-02-17 DIAGNOSIS — Z7984 Long term (current) use of oral hypoglycemic drugs: Secondary | ICD-10-CM | POA: Insufficient documentation

## 2019-02-17 DIAGNOSIS — I110 Hypertensive heart disease with heart failure: Secondary | ICD-10-CM | POA: Insufficient documentation

## 2019-02-17 DIAGNOSIS — R079 Chest pain, unspecified: Secondary | ICD-10-CM | POA: Insufficient documentation

## 2019-02-17 DIAGNOSIS — J449 Chronic obstructive pulmonary disease, unspecified: Secondary | ICD-10-CM | POA: Insufficient documentation

## 2019-02-17 DIAGNOSIS — R109 Unspecified abdominal pain: Secondary | ICD-10-CM | POA: Insufficient documentation

## 2019-02-17 DIAGNOSIS — E119 Type 2 diabetes mellitus without complications: Secondary | ICD-10-CM | POA: Insufficient documentation

## 2019-02-17 DIAGNOSIS — G4733 Obstructive sleep apnea (adult) (pediatric): Secondary | ICD-10-CM

## 2019-02-17 DIAGNOSIS — Z7982 Long term (current) use of aspirin: Secondary | ICD-10-CM | POA: Insufficient documentation

## 2019-02-17 DIAGNOSIS — Z79899 Other long term (current) drug therapy: Secondary | ICD-10-CM | POA: Insufficient documentation

## 2019-02-17 HISTORY — DX: Disorder of kidney and ureter, unspecified: N28.9

## 2019-02-17 HISTORY — DX: Heart failure, unspecified: I50.9

## 2019-02-17 LAB — COMPREHENSIVE METABOLIC PANEL
ALT: 21 U/L (ref 0–44)
AST: 13 U/L — ABNORMAL LOW (ref 15–41)
Albumin: 4.1 g/dL (ref 3.5–5.0)
Alkaline Phosphatase: 71 U/L (ref 38–126)
Anion gap: 11 (ref 5–15)
BUN: 7 mg/dL (ref 6–20)
CO2: 33 mmol/L — ABNORMAL HIGH (ref 22–32)
Calcium: 9 mg/dL (ref 8.9–10.3)
Chloride: 94 mmol/L — ABNORMAL LOW (ref 98–111)
Creatinine, Ser: 0.74 mg/dL (ref 0.61–1.24)
GFR calc Af Amer: >60 mL/min (ref >60)
GFR calc non Af Amer: >60 mL/min (ref >60)
Glucose, Bld: 176 mg/dL — ABNORMAL HIGH (ref 70–99)
Potassium: 3.7 mmol/L (ref 3.5–5.1)
Sodium: 138 mmol/L (ref 135–145)
Total Bilirubin: 0.8 mg/dL (ref 0.3–1.2)
Total Protein: 7.6 g/dL (ref 6.5–8.1)

## 2019-02-17 LAB — CBC
HCT: 56.9 % — ABNORMAL HIGH (ref 39.0–52.0)
Hemoglobin: 18.7 g/dL — ABNORMAL HIGH (ref 13.0–17.0)
MCH: 30.6 pg (ref 26.0–34.0)
MCHC: 32.9 g/dL (ref 30.0–36.0)
MCV: 93 fL (ref 80.0–100.0)
Platelets: 220 10*3/uL (ref 150–400)
RBC: 6.12 MIL/uL — ABNORMAL HIGH (ref 4.22–5.81)
RDW: 13.7 % (ref 11.5–15.5)
WBC: 9.9 10*3/uL (ref 4.0–10.5)
nRBC: 0 % (ref 0.0–0.2)

## 2019-02-17 LAB — URINALYSIS, ROUTINE W REFLEX MICROSCOPIC
Bilirubin Urine: NEGATIVE
Glucose, UA: NEGATIVE mg/dL
Hgb urine dipstick: NEGATIVE
Ketones, ur: NEGATIVE mg/dL
Leukocytes,Ua: NEGATIVE
Nitrite: NEGATIVE
Protein, ur: NEGATIVE mg/dL
Specific Gravity, Urine: 1.013 (ref 1.005–1.030)
pH: 7 (ref 5.0–8.0)

## 2019-02-17 LAB — TROPONIN I: Troponin I: <0.03 ng/mL (ref <0.03)

## 2019-02-17 LAB — LIPASE, BLOOD: Lipase: 28 U/L (ref 11–51)

## 2019-02-17 MED ORDER — ONDANSETRON 4 MG PO TBDP: 4.0000 mg | ORAL_TABLET | ORAL | 0 refills | Status: DC | PRN

## 2019-02-17 MED ORDER — SODIUM CHLORIDE (PF) 0.9 % IJ SOLN
INTRAMUSCULAR | Status: AC
  Filled 2019-02-17: qty 50

## 2019-02-17 MED ORDER — FENTANYL CITRATE (PF) 100 MCG/2ML IJ SOLN
50.0000 ug | INTRAMUSCULAR | Status: DC | PRN
  Administered 2019-02-17: 50 ug via INTRAVENOUS
  Filled 2019-02-17: qty 2

## 2019-02-17 MED ORDER — DOCUSATE SODIUM 100 MG PO CAPS: 100.0000 mg | ORAL_CAPSULE | Freq: Two times a day (BID) | ORAL | 0 refills | Status: DC

## 2019-02-17 MED ORDER — SODIUM CHLORIDE 0.9% FLUSH
3.0000 mL | Freq: Once | INTRAVENOUS | Status: AC
  Administered 2019-02-17: 3 mL via INTRAVENOUS

## 2019-02-17 MED ORDER — IOHEXOL 300 MG/ML  SOLN
100.0000 mL | Freq: Once | INTRAMUSCULAR | Status: AC | PRN
  Administered 2019-02-17: 100 mL via INTRAVENOUS

## 2019-02-17 MED ORDER — ONDANSETRON HCL 4 MG/2ML IJ SOLN
4.0000 mg | Freq: Once | INTRAMUSCULAR | Status: AC
  Administered 2019-02-17: 4 mg via INTRAVENOUS
  Filled 2019-02-17: qty 2

## 2019-02-17 MED ORDER — FAMOTIDINE 20 MG PO TABS: 20.0000 mg | ORAL_TABLET | Freq: Two times a day (BID) | ORAL | 2 refills | Status: AC

## 2019-02-17 NOTE — ED Notes (Signed)
Patient transported to CT 

## 2019-02-17 NOTE — ED Notes (Signed)
Patient transported to X-ray 

## 2019-02-17 NOTE — Discharge Instructions (Signed)
1.  You must stop your Glucophage (metformin) for 48 hours after your CT scan.  Stay hydrated.  Rest.  You should not be doing heavy physical labor in the heat for the next 48 hours.  You Risk having problems with your kidneys and blood acid level if you do not follow these instructions. 2.  You are leaving AGAINST MEDICAL ADVICE.  I have significant concern for your risk of a sudden heart attack with permanent disability or death.  You have multiple medical problems that need to be addressed. 3.  Follow instructions for gastritis and start Pepcid twice daily.  You may also take Zofran as prescribed if needed for nausea.  Start taking Colace as a stool softener twice daily for constipation. 4.  Return to the emergency department immediately if you have any concerns or worsening of symptoms.

## 2019-02-17 NOTE — ED Provider Notes (Signed)
[facility]-EMERGENCY DEPT Provider Note   CSN: [id number] Arrival date & time: 02/17/19  1429    History   Chief Complaint Chief Complaint  Patient presents with  . Abdominal Pain  . Chest Pain    HPI [facility] is a 43 y.o. male.     HPI   He presents for evaluation of abdominal pain with nausea and constipation.  Onset of symptoms 3 days ago.  He has decreased appetite but has been able to eat some.  Last bowel movement was 3 days ago.  He denies vomiting, fever, chills, change in chronic cough cough, or shortness of breath.  He feels like his abdomen is distended.  No prior abdominal surgery other than appendectomy.  He continues to smoke cigarettes and drink alcohol.  He works as a Surveyor, minerals.  No known sick contacts.  There are no other known modifying factors.  Past Medical History:  Diagnosis Date  . Acid reflux   . Acute respiratory failure (HCC)   . Alcohol abuse   . Anxiety   . Asthma    daily and prn inhalers  . CHF (congestive heart failure) (HCC)   . Chondromalacia of right patella 09/2014  . COPD (chronic obstructive pulmonary disease) (HCC)   . Diabetes mellitus without complication (HCC)    TYPE II  . Gastritis   . Gastritis   . History of traumatic brain injury 2006  . Hyperglycemia   . Hypertension    states under control with med., has been on med. x 1 yr.  . Meniscal cyst 09/2014   right knee  . Morbid obesity (HCC)   . Obesity   . Renal disorder   . Smokers' cough [facility])     Patient Active Problem List   Diagnosis Date Noted  . Acute exacerbation of chronic obstructive pulmonary disease (COPD) (HCC) 04/22/2018  . Diabetes mellitus type 2 in obese (HCC) 04/22/2018  . Atypical pneumonia   . Diabetes (HCC) 01/11/2016  . Prediabetes 09/17/2015  . URI (upper respiratory infection) 09/14/2015  . Chest pain 09/14/2015  . Cough 09/14/2015  . Acute respiratory failure with hypoxia (HCC) 09/14/2015  . COPD  exacerbation (HCC) 09/14/2015  . Tobacco use disorder 09/14/2015  . Tachycardia 09/14/2015  . Hyperglycemia 09/14/2015  . Acid reflux   . Obesity   . Hypertension   . COPD (chronic obstructive pulmonary disease) (HCC)     Past Surgical History:  Procedure Laterality Date  . APPENDECTOMY    . FOREIGN BODY REMOVAL Left 02/27/2008   hand  . INCISION AND DRAINAGE ABSCESS Left 03/01/2008   hand  . IRRIGATION AND DEBRIDEMENT ABSCESS Left 02/27/2008   hand  . KNEE ARTHROSCOPY WITH MEDIAL MENISECTOMY Right 09/25/2014   Procedure: RIGHT KNEE SCOPE CHONDROPLASTY/MEDIAL MENISCECTOMY;  Surgeon: Loreta Ave, MD;  Location: [facility];  Service: Orthopedics;  Laterality: Right;  ANESTHESIA: GENERAL, KNEE BLOCK  . TENOSYNOVECTOMY Left 02/27/2008   flexor tenosynovectomy left hand  . TONSILLECTOMY    . TONSILLECTOMY AND ADENOIDECTOMY    . WISDOM TOOTH EXTRACTION          Home Medications    Prior to Admission medications   Medication Sig Start Date End Date Taking? Authorizing Provider  aspirin 81 MG chewable tablet Chew 81 mg by mouth daily.   Yes [provider]  atorvastatin (LIPITOR) 20 MG tablet Take 20 mg by mouth daily.   Yes [provider]  furosemide (LASIX) 20 MG tablet Take  1 tablet (20 mg total) by mouth 2 (two) times daily. Patient taking differently: Take 40 mg by mouth daily.  04/25/18  Yes Richarda OverlieAbrol, Nayana, MD  glipiZIDE (GLUCOTROL XL) 5 MG 24 hr tablet Take 5 mg by mouth daily with breakfast.   Yes [provider]  ipratropium-albuterol (DUONEB) 0.5-2.5 (3) MG/3ML SOLN Take 3 mLs by nebulization every 4 (four) hours as needed (wheezing and shortness of breath).   Yes [provider]  losartan (COZAAR) 50 MG tablet Take 50 mg by mouth daily. 08/25/18  Yes [provider]  metFORMIN (GLUCOPHAGE) 500 MG tablet Take 1 tablet (500 mg total) by mouth 2 (two) times daily with a meal. Patient taking differently: Take 1,000  mg by mouth daily with breakfast.  10/08/15  Yes Henrietta HooverBernhardt, Linda C, NP  albuterol (PROVENTIL HFA;VENTOLIN HFA) 108 (90 BASE) MCG/ACT inhaler Inhale 2 puffs into the lungs every 4 (four) hours as needed for wheezing or shortness of breath. Patient not taking: Reported on 01/04/2016 09/17/15   Christiane HaSullivan, Corinna L, MD  busPIRone (BUSPAR) 10 MG tablet Take 1 tablet (10 mg total) by mouth 2 (two) times daily. Patient not taking: Reported on 01/04/2016 09/17/15   Christiane HaSullivan, Corinna L, MD  diclofenac sodium (VOLTAREN) 1 % GEL Apply 4 g topically 4 (four) times daily. Patient not taking: Reported on 02/17/2019 06/22/18   Maczis, Elmer SowMichael M, PA-C  guaiFENesin-dextromethorphan (ROBITUSSIN DM) 100-10 MG/5ML syrup Take 10 mLs by mouth every 6 (six) hours as needed for cough. Patient not taking: Reported on 02/17/2019 04/25/18   Richarda OverlieAbrol, Nayana, MD  methocarbamol (ROBAXIN) 500 MG tablet Take 1 tablet (500 mg total) by mouth 3 (three) times daily between meals as needed. Patient not taking: Reported on 02/17/2019 12/16/16   Rolland PorterJames, Mark, MD  mometasone-formoterol [facility](DULERA) 100-5 MCG/ACT AERO Inhale 2 puffs into the lungs 2 (two) times daily. Patient not taking: Reported on 01/04/2016 09/17/15   Christiane HaSullivan, Corinna L, MD  nicotine (NICODERM CQ - DOSED IN MG/24 HOURS) 21 mg/24hr patch Place 1 patch (21 mg total) onto the skin daily. Patient not taking: Reported on 01/04/2016 09/17/15   Christiane HaSullivan, Corinna L, MD  omeprazole (PRILOSEC) 20 MG capsule Take 1 capsule (20 mg total) by mouth daily. Patient not taking: Reported on 02/17/2019 01/22/16   Tilden Fossaees, Elizabeth, MD  oxyCODONE (ROXICODONE) 5 MG immediate release tablet Take 1 tablet (5 mg total) by mouth every 4 (four) hours as needed for severe pain. Patient not taking: Reported on 02/17/2019 12/16/16   Rolland PorterJames, Mark, MD  promethazine (PHENERGAN) 25 MG tablet Take 1 tablet (25 mg total) by mouth every 6 (six) hours as needed for nausea or vomiting. Patient not taking: Reported on 02/17/2019  01/22/16   Tilden Fossaees, Elizabeth, MD  sodium chloride (OCEAN) 0.65 % SOLN nasal spray Place 1 spray into both nostrils as needed for congestion (nose irritation). Patient not taking: Reported on 02/17/2019 04/25/18   Richarda OverlieAbrol, Nayana, MD  sucralfate (CARAFATE) 1 GM/10ML suspension Take 10 mLs (1 g total) by mouth 4 (four) times daily -  with meals and at bedtime. Patient not taking: Reported on 02/17/2019 01/22/16   Tilden Fossaees, Elizabeth, MD  thiamine 100 MG tablet Take 1 tablet (100 mg total) by mouth daily. Patient not taking: Reported on 02/17/2019 04/26/18   Richarda OverlieAbrol, Nayana, MD  traMADol (ULTRAM) 50 MG tablet Take 1 tablet (50 mg total) by mouth every 6 (six) hours as needed. Patient not taking: Reported on 02/17/2019 03/01/16   Alvira MondaySchlossman, Erin, MD  Family History Family History  Problem Relation Age of Onset  . Stomach cancer Mother   . Cancer Mother     Social History Social History   Tobacco Use  . Smoking status: Heavy Tobacco Smoker    Packs/day: 2.00    Years: 26.00    Pack years: 52.00    Types: Cigarettes  . Smokeless tobacco: Former Engineer, water Use Topics  . Alcohol use: Yes    Alcohol/week: 0.0 standard drinks    Comment: Daily  . Drug use: Yes    Types: Marijuana     Allergies   Acetaminophen and Shellfish allergy   Review of Systems Review of Systems  All other systems reviewed and are negative.    Physical Exam Updated Vital Signs BP (!) 142/90   Pulse 74   Temp 98.6 F (37 C) (Oral)   Resp 12   Wt 125.8 kg   SpO2 93%   BMI 43.42 kg/m   Physical Exam Vitals signs and nursing note reviewed.  Constitutional:      General: He is not in acute distress.    Appearance: He is well-developed. He is obese. He is not ill-appearing, toxic-appearing or diaphoretic.  HENT:     Head: Normocephalic and atraumatic.     Right Ear: External ear normal.     Left Ear: External ear normal.     Nose: No congestion.     Mouth/Throat:     Mouth: Mucous membranes are moist.      Pharynx: No oropharyngeal exudate or posterior oropharyngeal erythema.  Eyes:     Conjunctiva/sclera: Conjunctivae normal.     Pupils: Pupils are equal, round, and reactive to light.  Neck:     Musculoskeletal: Normal range of motion and neck supple.     Trachea: Phonation normal.  Cardiovascular:     Rate and Rhythm: Normal rate and regular rhythm.     Heart sounds: Normal heart sounds.  Pulmonary:     Effort: Pulmonary effort is normal. No respiratory distress.     Breath sounds: Normal breath sounds. No stridor.  Abdominal:     General: There is distension.     Palpations: Abdomen is soft. There is no mass.     Tenderness: There is abdominal tenderness (Bilateral upper quadrants, mild). There is no right CVA tenderness, left CVA tenderness, guarding or rebound.     Hernia: No hernia is present.  Musculoskeletal: Normal range of motion.  Skin:    General: Skin is warm and dry.  Neurological:     Mental Status: He is alert and oriented to person, place, and time.     Cranial Nerves: No cranial nerve deficit.     Sensory: No sensory deficit.     Motor: No abnormal muscle tone.     Coordination: Coordination normal.  Psychiatric:        Mood and Affect: Mood normal.        Behavior: Behavior normal.        Thought Content: Thought content normal.        Judgment: Judgment normal.      ED Treatments / Results  Labs (all labs ordered are listed, but only abnormal results are displayed) Labs Reviewed  COMPREHENSIVE METABOLIC PANEL - Abnormal; Notable for the following components:      Result Value   Chloride 94 (*)    CO2 33 (*)    Glucose, Bld 176 (*)    AST 13 (*)    All  other components within normal limits  CBC - Abnormal; Notable for the following components:   RBC 6.12 (*)    Hemoglobin 18.7 (*)    HCT 56.9 (*)    All other components within normal limits  LIPASE, BLOOD  URINALYSIS, ROUTINE W REFLEX MICROSCOPIC  TROPONIN I    EKG EKG Interpretation   Date/Time:  Sunday Feb 17 2019 15:00:06 EDT Ventricular Rate:  74 PR Interval:    QRS Duration: 92 QT Interval:  402 QTC Calculation: 446 R Axis:   92 Text Interpretation:  Sinus rhythm Probable left atrial enlargement Borderline right axis deviation since last tracing no significant change Confirmed by Mancel Bale [phone]) on 02/17/2019 3:57:28 PM   Radiology Dg Chest 2 View  Result Date: 02/17/2019 CLINICAL DATA:  Left-sided chest pain EXAM: CHEST - 2 VIEW COMPARISON:  04/22/2018 FINDINGS: The heart size and mediastinal contours are within normal limits. Both lungs are clear. The visualized skeletal structures are unremarkable. IMPRESSION: No acute abnormality of the lungs. Electronically Signed   By: Lauralyn Primes M.D.   On: 02/17/2019 15:28    Procedures Procedures (including critical care time)  Medications Ordered in ED Medications  fentaNYL (SUBLIMAZE) injection 50 mcg (50 mcg Intravenous Given 02/17/19 1612)  sodium chloride flush (NS) 0.9 % injection 3 mL (3 mLs Intravenous Given 02/17/19 1530)  ondansetron (ZOFRAN) injection 4 mg (4 mg Intravenous Given 02/17/19 1610)     Initial Impression / Assessment and Plan / ED Course  I have reviewed the triage vital signs and the nursing notes.  Pertinent labs & imaging results that were available during my care of the patient were reviewed by me and considered in my medical decision making (see chart for details).  Clinical Course as of Feb 16 1653  Sun Feb 17, 2019  1650 No CHF or infiltrate, images reviewed by me  DG Chest 2 View [EW]    Clinical Course User Index [EW] Mancel Bale, MD        Patient Vitals for the past 24 hrs:  BP Temp Temp src Pulse Resp SpO2 Weight  02/17/19 1600 (!) 142/90 - - 74 12 93 % -  02/17/19 1543 - - - - - 95 % -  02/17/19 1448 - - - - - - 125.8 kg  02/17/19 1446 (!) 154/99 98.6 F (37 C) Oral 74 20 95 % -     Medical Decision Making: Nonspecific abdominal and chest pain.   Evaluation for bowel obstruction, ACS, and acute infectious process ordered.  CRITICAL CARE-no Performed by: Mancel Bale  Nursing Notes Reviewed/ Care Coordinated Applicable Imaging Reviewed Interpretation of Laboratory Data incorporated into ED treatment  Plan: As per oncoming provider team after return of testing results and reevaluation.  Final Clinical Impressions(s) / ED Diagnoses   Final diagnoses:  Abdominal pain, unspecified abdominal location  Chest pain, unspecified type    ED Discharge Orders    None       Mancel Bale, MD 02/17/19 1654

## 2019-02-17 NOTE — ED Provider Notes (Signed)
I accepted care from Dr. Effie Shy to follow-up on diagnostic results and make final disposition.  I have extensively reviewed the patient's history of present illness and past medical history.  Patient has been experiencing particular the past 3 days a sensation of nausea in his epigastrium and upper abdomen that has been exacerbated by activity and deep breaths.  He had an episode of chest pain that shot across his left chest 2 days ago.  Patient is aware that he is chronically hypoxic.  He does at times monitor his oxygen saturation at home and reports that is typical for him to be in the mid 80s at rest and dropped down to 79 or 80% with activity.  He reports he is used to it and feels okay like that.  He also reports that he is supposed to have a sleep apnea machine and did not like using it so never uses one though he is well aware that he has sleep apnea and has had sleep studies in the past.  When I first entered the patient's room to review results and continue with his evaluation, patient was sound asleep with his head back, snoring and oxygen saturation at 65%.  Once awake, oxygen saturation normalized to anywhere from upper 80s to low 90s with normal conversation.  I reviewed all the risks of exacerbating heart disease by untreated sleep apnea.  Patient reports that he is compliant with his diabetes medications, daily aspirin and antihypertensives.  He reports however he has a very bad diet, he continues to smoke, and is overweight.  He reports these are bad habits that he does not think he can or has any plans to aggressively try to change.  We extensively discussed his serious risk factors for coronary artery disease and sudden death including my concern for anginal equivalent with his nausea and the episode of chest pain he experienced several days ago.  Patient reports that he knows that he might be at risk for having a big heart attack or even sudden death.  He reports however, due to his  financial situation he cannot afford to pursue further medical care as an inpatient.  Also reports that he does not think he has the self-discipline to try to improve on lifestyle.   Physical Exam  BP 113/82   Pulse 94   Temp 98.6 F (37 C) (Oral)   Resp 16   Wt 125.8 kg   SpO2 99%   BMI 43.42 kg/m   Physical Exam Constitutional:      Comments: Patient is alert with clear mental status.  He does not have respiratory distress at rest.  Significant central obesity.  HENT:     Mouth/Throat:     Pharynx: Oropharynx is clear.  Eyes:     Extraocular Movements: Extraocular movements intact.  Cardiovascular:     Rate and Rhythm: Normal rate and regular rhythm.  Pulmonary:     Effort: Pulmonary effort is normal.     Breath sounds: Normal breath sounds.     Comments: Breath sounds are clear. Abdominal:     Comments: Abdomen is obese.  Mild epigastric discomfort without guarding.  Musculoskeletal:     Comments: No peripheral edema, calves are soft and nontender.  Neurological:     General: No focal deficit present.     Mental Status: He is oriented to person, place, and time.     Coordination: Coordination normal.  Psychiatric:        Mood and Affect: Mood normal.  ED Course/Procedures   Clinical Course as of Feb 17 1912  Sun Feb 17, 2019  1650 No CHF or infiltrate, images reviewed by me  DG Chest 2 View [EW]    Clinical Course User Index [EW] Mancel BaleWentz, Elliott, MD    Procedures  MDM  Spent approximately 20 minutes talking to the patient and extensively reviewing his history and educating the patient on risk factors and management of his underlying disease.  Patient reports he is aware of all of these issues and aware of his very high risk of sudden death by MI and as well permanent disability.  At this time, he accepts these risks and advises he cannot be admitted to the hospital due to financial concerns as well as what he feels to be personal lack of commitment to major  lifestyle changes, at this time he does not feel that he could make dietary changes or quit smoking to impact his health.  Patient is educated on holding his metformin for the next 48 hours.  Return precautions are reviewed.  Patient does have a PCP and is compliant with his basic medications.  Does have GI complaints and reports history of chronic gastritis problems.  He is not on any medications at this time.  He wants a medication he can take as needed and does not want to be on a PPI.  He will start Pepcid and have Zofran if needed for nausea.  There is also some increased amount of stool on the CT in the transverse colon.  Will recommend Colace.  Patient is signed out AMA.      Arby BarrettePfeiffer, Rhondalyn Clingan, MD 02/17/19 [phone]

## 2019-02-17 NOTE — ED Triage Notes (Signed)
Pt reports abdominal swelling, nausea, constipation.C/o fatigue. Referred to ED by PCP via phone. Reports intermittent pain in l/upper chest and l/shoulder x 2 on Friday and Saturday. Denies fever. Denies increased cough. Pt has been using his regular Albuterol nebulizer. Pt is alert, oriented and cooperative. Ambulatory without shortness of breath.

## 2019-02-18 ENCOUNTER — Inpatient Hospital Stay (HOSPITAL_COMMUNITY)
Admission: EM | Admit: 2019-02-18 | Discharge: 2019-02-20 | DRG: 190 | Disposition: A | Discharge status: Home / Self Care | Payer: Medicaid Other | Attending: Family Medicine | Admitting: Family Medicine

## 2019-02-18 ENCOUNTER — Other Ambulatory Visit: Payer: Self-pay

## 2019-02-18 ENCOUNTER — Encounter (HOSPITAL_COMMUNITY): Payer: Self-pay | Admitting: Emergency Medicine

## 2019-02-18 ENCOUNTER — Emergency Department (HOSPITAL_COMMUNITY): Payer: Medicaid Other

## 2019-02-18 DIAGNOSIS — Z20828 Contact with and (suspected) exposure to other viral communicable diseases: Secondary | ICD-10-CM | POA: Diagnosis present

## 2019-02-18 DIAGNOSIS — Z87820 Personal history of traumatic brain injury: Secondary | ICD-10-CM | POA: Diagnosis not present

## 2019-02-18 DIAGNOSIS — E669 Obesity, unspecified: Secondary | ICD-10-CM | POA: Diagnosis not present

## 2019-02-18 DIAGNOSIS — Z79899 Other long term (current) drug therapy: Secondary | ICD-10-CM

## 2019-02-18 DIAGNOSIS — R05 Cough: Secondary | ICD-10-CM

## 2019-02-18 DIAGNOSIS — R059 Cough, unspecified: Secondary | ICD-10-CM

## 2019-02-18 DIAGNOSIS — E785 Hyperlipidemia, unspecified: Secondary | ICD-10-CM | POA: Diagnosis present

## 2019-02-18 DIAGNOSIS — Z7984 Long term (current) use of oral hypoglycemic drugs: Secondary | ICD-10-CM

## 2019-02-18 DIAGNOSIS — Z91013 Allergy to seafood: Secondary | ICD-10-CM | POA: Diagnosis not present

## 2019-02-18 DIAGNOSIS — I509 Heart failure, unspecified: Secondary | ICD-10-CM | POA: Diagnosis present

## 2019-02-18 DIAGNOSIS — I110 Hypertensive heart disease with heart failure: Secondary | ICD-10-CM | POA: Diagnosis present

## 2019-02-18 DIAGNOSIS — E1169 Type 2 diabetes mellitus with other specified complication: Secondary | ICD-10-CM

## 2019-02-18 DIAGNOSIS — E662 Morbid (severe) obesity with alveolar hypoventilation: Secondary | ICD-10-CM | POA: Diagnosis present

## 2019-02-18 DIAGNOSIS — Z6841 Body Mass Index (BMI) 40.0 and over, adult: Secondary | ICD-10-CM | POA: Diagnosis not present

## 2019-02-18 DIAGNOSIS — J9601 Acute respiratory failure with hypoxia: Secondary | ICD-10-CM | POA: Diagnosis present

## 2019-02-18 DIAGNOSIS — E119 Type 2 diabetes mellitus without complications: Secondary | ICD-10-CM | POA: Diagnosis present

## 2019-02-18 DIAGNOSIS — F17210 Nicotine dependence, cigarettes, uncomplicated: Secondary | ICD-10-CM | POA: Diagnosis present

## 2019-02-18 DIAGNOSIS — J9621 Acute and chronic respiratory failure with hypoxia: Secondary | ICD-10-CM | POA: Diagnosis present

## 2019-02-18 DIAGNOSIS — Z888 Allergy status to other drugs, medicaments and biological substances status: Secondary | ICD-10-CM

## 2019-02-18 DIAGNOSIS — Z7982 Long term (current) use of aspirin: Secondary | ICD-10-CM | POA: Diagnosis not present

## 2019-02-18 DIAGNOSIS — R6883 Chills (without fever): Secondary | ICD-10-CM

## 2019-02-18 DIAGNOSIS — R079 Chest pain, unspecified: Secondary | ICD-10-CM

## 2019-02-18 DIAGNOSIS — I10 Essential (primary) hypertension: Secondary | ICD-10-CM | POA: Diagnosis not present

## 2019-02-18 DIAGNOSIS — R0602 Shortness of breath: Secondary | ICD-10-CM

## 2019-02-18 DIAGNOSIS — R0902 Hypoxemia: Secondary | ICD-10-CM

## 2019-02-18 DIAGNOSIS — J441 Chronic obstructive pulmonary disease with (acute) exacerbation: Secondary | ICD-10-CM | POA: Diagnosis present

## 2019-02-18 HISTORY — DX: Sleep apnea, unspecified: G47.30

## 2019-02-18 LAB — COMPREHENSIVE METABOLIC PANEL
ALT: 22 U/L (ref 0–44)
AST: 13 U/L — ABNORMAL LOW (ref 15–41)
Albumin: 4 g/dL (ref 3.5–5.0)
Alkaline Phosphatase: 69 U/L (ref 38–126)
Anion gap: 11 (ref 5–15)
BUN: 6 mg/dL (ref 6–20)
CO2: 34 mmol/L — ABNORMAL HIGH (ref 22–32)
Calcium: 9 mg/dL (ref 8.9–10.3)
Chloride: 96 mmol/L — ABNORMAL LOW (ref 98–111)
Creatinine, Ser: 0.76 mg/dL (ref 0.61–1.24)
GFR calc Af Amer: >60 mL/min (ref >60)
GFR calc non Af Amer: >60 mL/min (ref >60)
Glucose, Bld: 133 mg/dL — ABNORMAL HIGH (ref 70–99)
Potassium: 3.8 mmol/L (ref 3.5–5.1)
Sodium: 141 mmol/L (ref 135–145)
Total Bilirubin: 0.7 mg/dL (ref 0.3–1.2)
Total Protein: 7.2 g/dL (ref 6.5–8.1)

## 2019-02-18 LAB — BRAIN NATRIURETIC PEPTIDE: B Natriuretic Peptide: 51.1 pg/mL (ref 0.0–100.0)

## 2019-02-18 LAB — CBC WITH DIFFERENTIAL/PLATELET
Abs Immature Granulocytes: 0.09 10*3/uL — ABNORMAL HIGH (ref 0.00–0.07)
Basophils Absolute: 0.1 10*3/uL (ref 0.0–0.1)
Basophils Relative: 1 %
Eosinophils Absolute: 0 10*3/uL (ref 0.0–0.5)
Eosinophils Relative: 0 %
HCT: 57.7 % — ABNORMAL HIGH (ref 39.0–52.0)
Hemoglobin: 18.2 g/dL — ABNORMAL HIGH (ref 13.0–17.0)
Immature Granulocytes: 1 %
Lymphocytes Relative: 23 %
Lymphs Abs: 2.1 10*3/uL (ref 0.7–4.0)
MCH: 30.1 pg (ref 26.0–34.0)
MCHC: 31.5 g/dL (ref 30.0–36.0)
MCV: 95.4 fL (ref 80.0–100.0)
Monocytes Absolute: 0.9 10*3/uL (ref 0.1–1.0)
Monocytes Relative: 10 %
Neutro Abs: 6 10*3/uL (ref 1.7–7.7)
Neutrophils Relative %: 65 %
Platelets: 213 10*3/uL (ref 150–400)
RBC: 6.05 MIL/uL — ABNORMAL HIGH (ref 4.22–5.81)
RDW: 13.9 % (ref 11.5–15.5)
WBC: 9.2 10*3/uL (ref 4.0–10.5)
nRBC: 0 % (ref 0.0–0.2)

## 2019-02-18 LAB — TROPONIN I: Troponin I: <0.03 ng/mL (ref <0.03)

## 2019-02-18 LAB — GLUCOSE, CAPILLARY: Glucose-Capillary: 128 mg/dL — ABNORMAL HIGH (ref 70–99)

## 2019-02-18 LAB — SARS CORONAVIRUS 2 BY RT PCR (HOSPITAL ORDER, PERFORMED IN ~~LOC~~ HOSPITAL LAB): SARS Coronavirus 2: NEGATIVE

## 2019-02-18 LAB — LIPASE, BLOOD: Lipase: 24 U/L (ref 11–51)

## 2019-02-18 LAB — D-DIMER, QUANTITATIVE: D-Dimer, Quant: 0.33 ug/mL-FEU (ref 0.00–0.50)

## 2019-02-18 MED ORDER — THIAMINE HCL 100 MG/ML IJ SOLN: 100.0000 mg | Freq: Every day | INTRAMUSCULAR | Status: DC

## 2019-02-18 MED ORDER — FUROSEMIDE 40 MG PO TABS
40.0000 mg | ORAL_TABLET | Freq: Every day | ORAL | Status: DC
  Administered 2019-02-19 – 2019-02-20 (×2): 40 mg via ORAL
  Filled 2019-02-18 (×2): qty 1

## 2019-02-18 MED ORDER — VITAMIN B-1 100 MG PO TABS
100.0000 mg | ORAL_TABLET | Freq: Every day | ORAL | Status: DC
  Administered 2019-02-18 – 2019-02-19 (×2): 100 mg via ORAL
  Filled 2019-02-18 (×3): qty 1

## 2019-02-18 MED ORDER — MORPHINE SULFATE (PF) 4 MG/ML IV SOLN
4.0000 mg | Freq: Once | INTRAVENOUS | Status: AC
  Administered 2019-02-18: 4 mg via INTRAVENOUS
  Filled 2019-02-18: qty 1

## 2019-02-18 MED ORDER — LOSARTAN POTASSIUM 50 MG PO TABS
50.0000 mg | ORAL_TABLET | Freq: Every day | ORAL | Status: DC
  Administered 2019-02-19 – 2019-02-20 (×2): 50 mg via ORAL
  Filled 2019-02-18 (×2): qty 1

## 2019-02-18 MED ORDER — ONDANSETRON 4 MG PO TBDP
4.0000 mg | ORAL_TABLET | ORAL | Status: DC | PRN
  Administered 2019-02-18: 4 mg via ORAL
  Filled 2019-02-18: qty 1

## 2019-02-18 MED ORDER — INSULIN ASPART 100 UNIT/ML ~~LOC~~ SOLN
0.0000 [IU] | Freq: Three times a day (TID) | SUBCUTANEOUS | Status: DC
  Administered 2019-02-19: 3 [IU] via SUBCUTANEOUS
  Administered 2019-02-19: 09:00:00 5 [IU] via SUBCUTANEOUS
  Administered 2019-02-19: 8 [IU] via SUBCUTANEOUS
  Administered 2019-02-20: 5 [IU] via SUBCUTANEOUS
  Administered 2019-02-20: 08:00:00 2 [IU] via SUBCUTANEOUS

## 2019-02-18 MED ORDER — NICOTINE 14 MG/24HR TD PT24
14.0000 mg | MEDICATED_PATCH | Freq: Once | TRANSDERMAL | Status: AC
  Administered 2019-02-18: 14 mg via TRANSDERMAL
  Filled 2019-02-18: qty 1

## 2019-02-18 MED ORDER — ATORVASTATIN CALCIUM 20 MG PO TABS
20.0000 mg | ORAL_TABLET | Freq: Every day | ORAL | Status: DC
  Administered 2019-02-19 – 2019-02-20 (×2): 20 mg via ORAL
  Filled 2019-02-18 (×2): qty 1

## 2019-02-18 MED ORDER — FAMOTIDINE 20 MG PO TABS
20.0000 mg | ORAL_TABLET | Freq: Two times a day (BID) | ORAL | Status: DC
  Administered 2019-02-18 – 2019-02-20 (×4): 20 mg via ORAL
  Filled 2019-02-18 (×4): qty 1

## 2019-02-18 MED ORDER — LORAZEPAM 2 MG/ML IJ SOLN: 1.0000 mg | Freq: Four times a day (QID) | INTRAMUSCULAR | Status: DC | PRN

## 2019-02-18 MED ORDER — ONDANSETRON HCL 4 MG/2ML IJ SOLN
4.0000 mg | Freq: Once | INTRAMUSCULAR | Status: AC
  Administered 2019-02-18: 4 mg via INTRAVENOUS
  Filled 2019-02-18: qty 2

## 2019-02-18 MED ORDER — DOCUSATE SODIUM 100 MG PO CAPS
100.0000 mg | ORAL_CAPSULE | Freq: Two times a day (BID) | ORAL | Status: DC
  Administered 2019-02-18 – 2019-02-20 (×4): 100 mg via ORAL
  Filled 2019-02-18 (×4): qty 1

## 2019-02-18 MED ORDER — ASPIRIN 81 MG PO CHEW
81.0000 mg | CHEWABLE_TABLET | Freq: Every day | ORAL | Status: DC
  Administered 2019-02-19 – 2019-02-20 (×2): 81 mg via ORAL
  Filled 2019-02-18 (×2): qty 1

## 2019-02-18 MED ORDER — NICOTINE 21 MG/24HR TD PT24
21.0000 mg | MEDICATED_PATCH | Freq: Every day | TRANSDERMAL | Status: DC
  Administered 2019-02-19 – 2019-02-20 (×2): 21 mg via TRANSDERMAL
  Filled 2019-02-18 (×2): qty 1

## 2019-02-18 MED ORDER — SODIUM CHLORIDE 0.9 % IV SOLN
1.0000 g | INTRAVENOUS | Status: DC
  Administered 2019-02-18 – 2019-02-19 (×2): 1 g via INTRAVENOUS
  Filled 2019-02-18: qty 1
  Filled 2019-02-18 (×2): qty 10

## 2019-02-18 MED ORDER — ADULT MULTIVITAMIN W/MINERALS CH
1.0000 | ORAL_TABLET | Freq: Every day | ORAL | Status: DC
  Administered 2019-02-18 – 2019-02-19 (×2): 1 via ORAL
  Filled 2019-02-18 (×3): qty 1

## 2019-02-18 MED ORDER — IPRATROPIUM BROMIDE HFA 17 MCG/ACT IN AERS
2.0000 | INHALATION_SPRAY | Freq: Once | RESPIRATORY_TRACT | Status: AC
  Administered 2019-02-18: 2 via RESPIRATORY_TRACT
  Filled 2019-02-18: qty 12.9

## 2019-02-18 MED ORDER — METHYLPREDNISOLONE SODIUM SUCC 125 MG IJ SOLR
125.0000 mg | Freq: Once | INTRAMUSCULAR | Status: AC
  Administered 2019-02-18: 20:00:00 125 mg via INTRAVENOUS
  Filled 2019-02-18: qty 2

## 2019-02-18 MED ORDER — ALBUTEROL SULFATE HFA 108 (90 BASE) MCG/ACT IN AERS
2.0000 | INHALATION_SPRAY | Freq: Once | RESPIRATORY_TRACT | Status: AC
  Administered 2019-02-18: 2 via RESPIRATORY_TRACT
  Filled 2019-02-18: qty 6.7

## 2019-02-18 MED ORDER — LORAZEPAM 1 MG PO TABS
1.0000 mg | ORAL_TABLET | Freq: Four times a day (QID) | ORAL | Status: DC | PRN
  Administered 2019-02-18 – 2019-02-20 (×2): 1 mg via ORAL
  Filled 2019-02-18 (×2): qty 1

## 2019-02-18 MED ORDER — FOLIC ACID 1 MG PO TABS
1.0000 mg | ORAL_TABLET | Freq: Every day | ORAL | Status: DC
  Administered 2019-02-18 – 2019-02-19 (×2): 1 mg via ORAL
  Filled 2019-02-18 (×3): qty 1

## 2019-02-18 MED ORDER — UMECLIDINIUM BROMIDE 62.5 MCG/INH IN AEPB
1.0000 | INHALATION_SPRAY | Freq: Every day | RESPIRATORY_TRACT | Status: DC
  Administered 2019-02-18 – 2019-02-20 (×3): 1 via RESPIRATORY_TRACT
  Filled 2019-02-18: qty 7

## 2019-02-18 MED ORDER — ALBUTEROL SULFATE (2.5 MG/3ML) 0.083% IN NEBU: 2.5000 mg | INHALATION_SOLUTION | RESPIRATORY_TRACT | Status: DC | PRN

## 2019-02-18 MED ORDER — PREDNISONE 20 MG PO TABS
40.0000 mg | ORAL_TABLET | Freq: Every day | ORAL | Status: DC
  Administered 2019-02-19 – 2019-02-20 (×2): 40 mg via ORAL
  Filled 2019-02-18 (×2): qty 2

## 2019-02-18 MED ORDER — ENOXAPARIN SODIUM 60 MG/0.6ML ~~LOC~~ SOLN
60.0000 mg | SUBCUTANEOUS | Status: DC
  Administered 2019-02-18 – 2019-02-19 (×2): 60 mg via SUBCUTANEOUS
  Filled 2019-02-18 (×2): qty 0.6

## 2019-02-18 MED ORDER — ONDANSETRON HCL 4 MG/2ML IJ SOLN: 4.0000 mg | Freq: Four times a day (QID) | INTRAMUSCULAR | Status: DC | PRN

## 2019-02-18 MED ORDER — MOMETASONE FURO-FORMOTEROL FUM 200-5 MCG/ACT IN AERO
1.0000 | INHALATION_SPRAY | Freq: Two times a day (BID) | RESPIRATORY_TRACT | Status: DC
  Administered 2019-02-18 – 2019-02-20 (×4): 1 via RESPIRATORY_TRACT
  Filled 2019-02-18: qty 8.8

## 2019-02-18 MED ORDER — METFORMIN HCL 500 MG PO TABS
1000.0000 mg | ORAL_TABLET | Freq: Every day | ORAL | Status: DC
  Administered 2019-02-19 – 2019-02-20 (×2): 1000 mg via ORAL
  Filled 2019-02-18 (×2): qty 2

## 2019-02-18 NOTE — ED Notes (Signed)
Gave report to [facility], RN for room [phone]

## 2019-02-18 NOTE — ED Notes (Signed)
ED TO INPATIENT HANDOFF REPORT  ED Nurse Name and Phone #:  Terrilyn Saver RN [phone]   S Name/Age/Gender Steven Dudley 43 y.o. male Room/Bed: WA11/WA11  Code Status   Code Status: Full Code  Home/SNF/Other Home Patient oriented to: self, place, time and situation Is this baseline? Yes   Triage Complete: Triage complete  Chief Complaint Abdominal pain, Headache  Triage Note Pt reports that he was seen here yesterday for his abd pains and constipation and left to spend time with family when they recommended being admitted. Reports that he felt worse last night after taking the prescribed medications which caused a migraine. Pt reports came back today to be admitted.    Allergies Allergies  Allergen Reactions  . Acetaminophen Other (See Comments)    GI BLEEDING Other reaction(s): Other GI BLEEDING  . Shellfish Allergy Nausea And Vomiting, Nausea Only and Other (See Comments)    Headache, heat flashes. Other reaction(s): Other Headache, heat flashes.    Level of Care/Admitting Diagnosis ED Disposition    ED Disposition Condition Comment   Admit  Hospital Area: [facility] [[id number]]  Level of Care: Med-Surg [16]  Covid Evaluation: N/A  Diagnosis: COPD with acute exacerbation [facility]) [[id number]]  Admitting Physician: Wyvonnia Dusky  Attending Physician: Hillary Bow [phone]  Estimated length of stay: past midnight tomorrow  Certification:: I certify this patient will need inpatient services for at least 2 midnights  PT Class (Do Not Modify): Inpatient [101]  PT Acc Code (Do Not Modify): Private [1]       B Medical/Surgery History Past Medical History:  Diagnosis Date  . Acid reflux   . Acute respiratory failure (HCC)   . Alcohol abuse   . Anxiety   . Asthma    daily and prn inhalers  . CHF (congestive heart failure) (HCC)   . Chondromalacia of right patella 09/2014  . COPD (chronic obstructive pulmonary disease) (HCC)   .  Diabetes mellitus without complication (HCC)    TYPE II  . Gastritis   . Gastritis   . History of traumatic brain injury 2006  . Hyperglycemia   . Hypertension    states under control with med., has been on med. x 1 yr.  . Meniscal cyst 09/2014   right knee  . Morbid obesity (HCC)   . Obesity   . Renal disorder   . Sleep apnea   . Smokers' cough [facility])    Past Surgical History:  Procedure Laterality Date  . APPENDECTOMY    . FOREIGN BODY REMOVAL Left 02/27/2008   hand  . INCISION AND DRAINAGE ABSCESS Left 03/01/2008   hand  . IRRIGATION AND DEBRIDEMENT ABSCESS Left 02/27/2008   hand  . KNEE ARTHROSCOPY WITH MEDIAL MENISECTOMY Right 09/25/2014   Procedure: RIGHT KNEE SCOPE CHONDROPLASTY/MEDIAL MENISCECTOMY;  Surgeon: Loreta Ave, MD;  Location: [facility];  Service: Orthopedics;  Laterality: Right;  ANESTHESIA: GENERAL, KNEE BLOCK  . TENOSYNOVECTOMY Left 02/27/2008   flexor tenosynovectomy left hand  . TONSILLECTOMY    . TONSILLECTOMY AND ADENOIDECTOMY    . WISDOM TOOTH EXTRACTION       A IV Location/Drains/Wounds Patient Lines/Drains/Airways Status   Active Line/Drains/Airways    Name:   Placement date:   Placement time:   Site:   Days:   Peripheral IV 02/18/19 Right Forearm   02/18/19    1646    Forearm   less than 1  Intake/Output Last 24 hours No intake or output data in the 24 hours ending 02/18/19 2057  Labs/Imaging Results for orders placed or performed during the hospital encounter of 02/18/19 (from the past 48 hour(s))  CBC with Differential     Status: Abnormal   Collection Time: 02/18/19  4:09 PM  Result Value Ref Range   WBC 9.2 4.0 - 10.5 K/uL   RBC 6.05 (H) 4.22 - 5.81 MIL/uL   Hemoglobin 18.2 (H) 13.0 - 17.0 g/dL   HCT 47.8 (H) 29.5 - 62.1 %   MCV 95.4 80.0 - 100.0 fL   MCH 30.1 26.0 - 34.0 pg   MCHC 31.5 30.0 - 36.0 g/dL   RDW 30.8 65.7 - 84.6 %   Platelets 213 150 - 400 K/uL   nRBC 0.0 0.0 - 0.2 %   Neutrophils  Relative % 65 %   Neutro Abs 6.0 1.7 - 7.7 K/uL   Lymphocytes Relative 23 %   Lymphs Abs 2.1 0.7 - 4.0 K/uL   Monocytes Relative 10 %   Monocytes Absolute 0.9 0.1 - 1.0 K/uL   Eosinophils Relative 0 %   Eosinophils Absolute 0.0 0.0 - 0.5 K/uL   Basophils Relative 1 %   Basophils Absolute 0.1 0.0 - 0.1 K/uL   Immature Granulocytes 1 %   Abs Immature Granulocytes 0.09 (H) 0.00 - 0.07 K/uL    Comment: Performed at [facility], [address], [place], [place] [address]  Comprehensive metabolic panel     Status: Abnormal   Collection Time: 02/18/19  4:09 PM  Result Value Ref Range   Sodium 141 135 - 145 mmol/L   Potassium 3.8 3.5 - 5.1 mmol/L   Chloride 96 (L) 98 - 111 mmol/L   CO2 34 (H) 22 - 32 mmol/L   Glucose, Bld 133 (H) 70 - 99 mg/dL   BUN 6 6 - 20 mg/dL   Creatinine, Ser 2.84 0.61 - 1.24 mg/dL   Calcium 9.0 8.9 - 13.2 mg/dL   Total Protein 7.2 6.5 - 8.1 g/dL   Albumin 4.0 3.5 - 5.0 g/dL   AST 13 (L) 15 - 41 U/L   ALT 22 0 - 44 U/L   Alkaline Phosphatase 69 38 - 126 U/L   Total Bilirubin 0.7 0.3 - 1.2 mg/dL   GFR calc non Af Amer >60 >60 mL/min   GFR calc Af Amer >60 >60 mL/min   Anion gap 11 5 - 15    Comment: Performed at [facility], [address] [place], [place] [address]  Lipase, blood     Status: None   Collection Time: 02/18/19  4:09 PM  Result Value Ref Range   Lipase 24 11 - 51 U/L    Comment: Performed at [facility], [address] [place], [place] [address]  Troponin I - ONCE - STAT     Status: None   Collection Time: 02/18/19  4:09 PM  Result Value Ref Range   Troponin I <0.03 <0.03 ng/mL    Comment: Performed at [facility], [address] [place], [place] [address]  Brain natriuretic peptide     Status: None   Collection Time: 02/18/19  4:09 PM  Result Value Ref Range   B Natriuretic Peptide 51.1 0.0 - 100.0 pg/mL    Comment: Performed at [facility], [address], [place], [place] [address]  D-dimer, quantitative (not at [facility])     Status: None  Collection Time: 02/18/19  4:09 PM  Result Value Ref Range   D-Dimer, Quant 0.33 0.00 - 0.50 ug/mL-FEU    Comment: (NOTE) At the manufacturer cut-off of 0.50 ug/mL FEU, this assay has been documented to exclude PE with a sensitivity and negative predictive value of 97 to 99%.  At this time, this assay has not been approved by the FDA to exclude DVT/VTE. Results should be correlated with clinical presentation. Performed at [facility], [address] [place], [place] [address]   SARS Coronavirus 2 (CEPHEID- Performed in [facility] hospital lab), Hosp Order     Status: None   Collection Time: 02/18/19  4:11 PM  Result Value Ref Range   SARS Coronavirus 2 NEGATIVE NEGATIVE    Comment: (NOTE) If result is NEGATIVE SARS-CoV-2 target nucleic acids are NOT DETECTED. The SARS-CoV-2 RNA is generally detectable in upper and lower  respiratory specimens during the acute phase of infection. The lowest  concentration of SARS-CoV-2 viral copies this assay can detect is 250  copies / mL. A negative result does not preclude SARS-CoV-2 infection  and should not be used as the sole basis for treatment or other  patient management decisions.  A negative result may occur with  improper specimen collection / handling, submission of specimen other  than nasopharyngeal swab, presence of viral mutation(s) within the  areas targeted by this assay, and inadequate number of viral copies  (<250 copies / mL). A negative result must be combined with clinical  observations, patient history, and epidemiological information. If result is POSITIVE SARS-CoV-2 target nucleic acids are DETECTED. The SARS-CoV-2 RNA is generally detectable in upper and lower  respiratory specimens dur ing the acute phase of infection.  Positive  results are indicative of active infection with SARS-CoV-2.   Clinical  correlation with patient history and other diagnostic information is  necessary to determine patient infection status.  Positive results do  not rule out bacterial infection or co-infection with other viruses. If result is PRESUMPTIVE POSTIVE SARS-CoV-2 nucleic acids MAY BE PRESENT.   A presumptive positive result was obtained on the submitted specimen  and confirmed on repeat testing.  While 2019 novel coronavirus  (SARS-CoV-2) nucleic acids may be present in the submitted sample  additional confirmatory testing may be necessary for epidemiological  and / or clinical management purposes  to differentiate between  SARS-CoV-2 and other Sarbecovirus currently known to infect humans.  If clinically indicated additional testing with an alternate test  methodology [phone]) is advised. The SARS-CoV-2 RNA is generally  detectable in upper and lower respiratory sp ecimens during the acute  phase of infection. The expected result is Negative. Fact Sheet for Patients:  BoilerBrush.com.cy Fact Sheet for Healthcare Providers: https://pope.com/ This test is not yet approved or cleared by the [place] FDA and has been authorized for detection and/or diagnosis of SARS-CoV-2 by FDA under an Emergency Use Authorization (EUA).  This EUA will remain in effect (meaning this test can be used) for the duration of the COVID-19 declaration under Section 564(b)(1) of the Act, 21 U.S.C. section 360bbb-3(b)(1), unless the authorization is terminated or revoked sooner. Performed at [facility], [address], [place], [place] [address]    Dg Chest 2 View  Result Date: 02/17/2019 CLINICAL DATA:  Left-sided chest pain EXAM: CHEST - 2 VIEW COMPARISON:  04/22/2018 FINDINGS: The heart size and mediastinal contours are within normal limits. Both lungs are clear. The visualized skeletal structures are unremarkable. IMPRESSION: No  acute abnormality of the lungs. Electronically Signed   By: Lauralyn PrimesAlex  Bibbey M.D.   On: 02/17/2019 15:28   Ct Abdomen Pelvis W Contrast  Result Date: 02/17/2019 CLINICAL DATA:  Abdominal pain and swelling EXAM: CT ABDOMEN AND PELVIS WITH CONTRAST TECHNIQUE: Multidetector CT imaging of the abdomen and pelvis was performed using the standard protocol following bolus administration of intravenous contrast. CONTRAST:  100mL OMNIPAQUE 300 COMPARISON:  06/26/2012 FINDINGS: Lower chest: No acute abnormality. Hepatobiliary: Mild fatty infiltration of the liver is noted. Pancreas: Unremarkable. No pancreatic ductal dilatation or surrounding inflammatory changes. Spleen: Normal in size without focal abnormality. Adrenals/Urinary Tract: Adrenal glands are unremarkable. Kidneys are normal, without renal calculi, focal lesion, or hydronephrosis. Bladder is decompressed. Stomach/Bowel: The appendix has been surgically removed. No obstructive or inflammatory changes are noted. Vascular/Lymphatic: Aortic atherosclerosis. No enlarged abdominal or pelvic lymph nodes. Reproductive: Prostate is unremarkable. Other: No abdominal wall hernia or abnormality. No abdominopelvic ascites. Musculoskeletal: No acute or significant osseous findings. IMPRESSION: No acute abnormality noted. Stable fatty liver. Electronically Signed   By: Alcide CleverMark  Lukens M.D.   On: 02/17/2019 17:41   Dg Chest Portable 1 View  Result Date: 02/18/2019 CLINICAL DATA:  Shortness of breath EXAM: PORTABLE CHEST 1 VIEW COMPARISON:  Feb 17, 2019 FINDINGS: No edema or consolidation. Heart is upper normal in size with pulmonary vascularity normal. No adenopathy. No bone lesions. IMPRESSION: No edema or consolidation.  Stable cardiac silhouette. Electronically Signed   By: Bretta BangWilliam  Woodruff III M.D.   On: 02/18/2019 17:22    Pending Labs Unresulted Labs (From admission, onward)    Start     Ordered   02/19/19 0500  CBC  Tomorrow morning,   R     02/18/19 1941    02/19/19 0500  Basic metabolic panel  Tomorrow morning,   R     02/18/19 1941   02/18/19 1938  HIV antibody  Once,   R     02/18/19 1941          Vitals/Pain Today's Vitals   02/18/19 2000 02/18/19 2011 02/18/19 2030 02/18/19 2045  BP: 109/63  123/86 99/60  Pulse: 81  74 81  Resp: 14  (!) 9 12  Temp:      TempSrc:      SpO2: 93%  94% 97%  PainSc:  6       Isolation Precautions Droplet and Contact precautions  Medications Medications  nicotine (NICODERM CQ - dosed in mg/24 hours) patch 14 mg (14 mg Transdermal Patch Applied 02/18/19 1852)  nicotine (NICODERM CQ - dosed in mg/24 hours) patch 21 mg (has no administration in time range)  enoxaparin (LOVENOX) injection 40 mg (has no administration in time range)  predniSONE (DELTASONE) tablet 40 mg (has no administration in time range)  mometasone-formoterol (DULERA) 200-5 MCG/ACT inhaler 1 puff (has no administration in time range)  umeclidinium bromide (INCRUSE ELLIPTA) 62.5 MCG/INH 1 puff (has no administration in time range)  albuterol (PROVENTIL) (2.5 MG/3ML) 0.083% nebulizer solution 2.5 mg (has no administration in time range)  cefTRIAXone (ROCEPHIN) 1 g in sodium chloride 0.9 % 100 mL IVPB (1 g Intravenous New Bag/Given (Non-Interop) 02/18/19 2013)  aspirin chewable tablet 81 mg (has no administration in time range)  atorvastatin (LIPITOR) tablet 20 mg (has no administration in time range)  docusate sodium (COLACE) capsule 100 mg (has no administration in time range)  famotidine (PEPCID) tablet 20 mg (has no administration in time range)  metFORMIN (GLUCOPHAGE) tablet 1,000 mg (has no  administration in time range)  losartan (COZAAR) tablet 50 mg (has no administration in time range)  furosemide (LASIX) tablet 40 mg (has no administration in time range)  ondansetron (ZOFRAN-ODT) disintegrating tablet 4 mg (has no administration in time range)  insulin aspart (novoLOG) injection 0-15 Units (has no administration in time  range)  LORazepam (ATIVAN) tablet 1 mg (has no administration in time range)    Or  LORazepam (ATIVAN) injection 1 mg (has no administration in time range)  thiamine (VITAMIN B-1) tablet 100 mg (has no administration in time range)    Or  thiamine (B-1) injection 100 mg (has no administration in time range)  folic acid (FOLVITE) tablet 1 mg (has no administration in time range)  multivitamin with minerals tablet 1 tablet (has no administration in time range)  ondansetron (ZOFRAN) injection 4 mg (has no administration in time range)  albuterol (VENTOLIN HFA) 108 (90 Base) MCG/ACT inhaler 2 puff (2 puffs Inhalation Given 02/18/19 1631)  ipratropium (ATROVENT HFA) inhaler 2 puff (2 puffs Inhalation Given 02/18/19 1629)  ondansetron (ZOFRAN) injection 4 mg (4 mg Intravenous Given 02/18/19 1658)  morphine 4 MG/ML injection 4 mg (4 mg Intravenous Given 02/18/19 1700)  morphine 4 MG/ML injection 4 mg (4 mg Intravenous Given 02/18/19 1854)  ondansetron (ZOFRAN) injection 4 mg (4 mg Intravenous Given 02/18/19 1853)  methylPREDNISolone sodium succinate (SOLU-MEDROL) 125 mg/2 mL injection 125 mg (125 mg Intravenous Given 02/18/19 2011)    Mobility walks Low fall risk   Focused Assessments Pulmonary Assessment Handoff:  Lung sounds: Bilateral Breath Sounds: Clear L Breath Sounds: Clear R Breath Sounds: Clear O2 Device: Room Air O2 Flow Rate (L/min): 4 L/min   ,     R Recommendations: See Admitting Provider Note  Report given to:   Additional Notes: N/A

## 2019-02-18 NOTE — ED Notes (Signed)
Provided patient a diet ginger ale with permission from Dr. Rush Landmark.

## 2019-02-18 NOTE — H&P (Signed)
History and Physical    [name] RTM:[id number] DOB: 09-03-1976 DOA: 02/18/2019  PCP: Macy Mis, MD  Patient coming from: Home  I have personally briefly reviewed patient's old medical records in [facility]  Chief Complaint: SOB  HPI: Steven Dudley is a 43 y.o. male with medical history significant of COPD, DM2, HTN, CHF.  Patient still smoking 2 PPD.  Patient presents to the ED with 1 week history of worsening SOB, wheezing, cough, N/V.  Symptoms not helped by his home inhaler.  Onset 1 week ago, worsening.   ED Course: COVID-19 is negative.  Found to have new O2 requirement.  Apparently they have tried to put him on home O2 in the past though he has declined to go on this.  Says he usually sats in the 80s, but today satting in the 70s which was unusual.  Given steroids, neb treatments.  Trop neg today, was also neg 20 hours ago when seen in ED yesterday for similar symptoms.   BNP 50.   Review of Systems: As per HPI otherwise 10 point review of systems negative.   Past Medical History:  Diagnosis Date  . Acid reflux   . Acute respiratory failure (HCC)   . Alcohol abuse   . Anxiety   . Asthma    daily and prn inhalers  . CHF (congestive heart failure) (HCC)   . Chondromalacia of right patella 09/2014  . COPD (chronic obstructive pulmonary disease) (HCC)   . Diabetes mellitus without complication (HCC)    TYPE II  . Gastritis   . Gastritis   . History of traumatic brain injury 2006  . Hyperglycemia   . Hypertension    states under control with med., has been on med. x 1 yr.  . Meniscal cyst 09/2014   right knee  . Morbid obesity (HCC)   . Obesity   . Renal disorder   . Sleep apnea   . Smokers' cough [facility])     Past Surgical History:  Procedure Laterality Date  . APPENDECTOMY    . FOREIGN BODY REMOVAL Left 02/27/2008   hand  . INCISION AND DRAINAGE ABSCESS Left 03/01/2008   hand  . IRRIGATION AND DEBRIDEMENT ABSCESS Left 02/27/2008   hand   . KNEE ARTHROSCOPY WITH MEDIAL MENISECTOMY Right 09/25/2014   Procedure: RIGHT KNEE SCOPE CHONDROPLASTY/MEDIAL MENISCECTOMY;  Surgeon: Loreta Ave, MD;  Location: [facility];  Service: Orthopedics;  Laterality: Right;  ANESTHESIA: GENERAL, KNEE BLOCK  . TENOSYNOVECTOMY Left 02/27/2008   flexor tenosynovectomy left hand  . TONSILLECTOMY    . TONSILLECTOMY AND ADENOIDECTOMY    . WISDOM TOOTH EXTRACTION       reports that he has been smoking cigarettes. He has a 52.00 pack-year smoking history. He has quit using smokeless tobacco. He reports current alcohol use. He reports current drug use. Drug: Marijuana.  Allergies  Allergen Reactions  . Acetaminophen Other (See Comments)    GI BLEEDING Other reaction(s): Other GI BLEEDING  . Shellfish Allergy Nausea And Vomiting, Nausea Only and Other (See Comments)    Headache, heat flashes. Other reaction(s): Other Headache, heat flashes.    Family History  Problem Relation Age of Onset  . Stomach cancer Mother   . Cancer Mother      Prior to Admission medications   Medication Sig Start Date End Date Taking? Authorizing Provider  aspirin 81 MG chewable tablet Chew 81 mg by mouth daily.   Yes [provider]  atorvastatin (  LIPITOR) 20 MG tablet Take 20 mg by mouth daily.   Yes [provider]  docusate sodium (COLACE) 100 MG capsule Take 1 capsule (100 mg total) by mouth every 12 (twelve) hours. 02/17/19  Yes Arby Barrette, MD  famotidine (PEPCID) 20 MG tablet Take 1 tablet (20 mg total) by mouth 2 (two) times daily. 02/17/19  Yes Arby Barrette, MD  furosemide (LASIX) 20 MG tablet Take 1 tablet (20 mg total) by mouth 2 (two) times daily. Patient taking differently: Take 40 mg by mouth daily.  04/25/18  Yes Richarda Overlie, MD  glipiZIDE (GLUCOTROL XL) 5 MG 24 hr tablet Take 5 mg by mouth daily with breakfast.   Yes [provider]  ipratropium-albuterol (DUONEB) 0.5-2.5 (3) MG/3ML SOLN Take 3 mLs  by nebulization every 4 (four) hours as needed (wheezing and shortness of breath).   Yes [provider]  losartan (COZAAR) 50 MG tablet Take 50 mg by mouth daily. 08/25/18  Yes [provider]  metFORMIN (GLUCOPHAGE) 500 MG tablet Take 1 tablet (500 mg total) by mouth 2 (two) times daily with a meal. Patient taking differently: Take 1,000 mg by mouth daily with breakfast.  10/08/15  Yes Henrietta Hoover, NP  ondansetron (ZOFRAN ODT) 4 MG disintegrating tablet Take 1 tablet (4 mg total) by mouth every 4 (four) hours as needed for nausea or vomiting. 02/17/19  Yes Arby Barrette, MD    Physical Exam: Vitals:   02/18/19 1219 02/18/19 1538 02/18/19 1715 02/18/19 1829  BP: 124/76 133/74 114/67 130/82  Pulse: 95 87 82 85  Resp: 16 20 (!) 9 14  Temp: 99 F (37.2 C) 97.9 F (36.6 C)  97.6 F (36.4 C)  TempSrc: Oral Oral  Oral  SpO2: 90% 94% 95% 96%    Constitutional: NAD, calm, comfortable Eyes: PERRL, lids and conjunctivae normal ENMT: Mucous membranes are moist. Posterior pharynx clear of any exudate or lesions.Normal dentition.  Neck: normal, supple, no masses, no thyromegaly Respiratory: Wheezing Cardiovascular: Regular rate and rhythm, no murmurs / rubs / gallops. No extremity edema. 2+ pedal pulses. No carotid bruits.  Abdomen: no tenderness, no masses palpated. No hepatosplenomegaly. Bowel sounds positive.  Musculoskeletal: no clubbing / cyanosis. No joint deformity upper and lower extremities. Good ROM, no contractures. Normal muscle tone.  Skin: no rashes, lesions, ulcers. No induration Neurologic: CN 2-12 grossly intact. Sensation intact, DTR normal. Strength 5/5 in all 4.  Psychiatric: Normal judgment and insight. Alert and oriented x 3. Normal mood.    Labs on Admission: I have personally reviewed following labs and imaging studies  CBC: Recent Labs  Lab 02/17/19 1449 02/18/19 1609  WBC 9.9 9.2  NEUTROABS  --  6.0  HGB 18.7* 18.2*  HCT 56.9* 57.7*   MCV 93.0 95.4  PLT 220 213   Basic Metabolic Panel: Recent Labs  Lab 02/17/19 1449 02/18/19 1609  NA 138 141  K 3.7 3.8  CL 94* 96*  CO2 33* 34*  GLUCOSE 176* 133*  BUN 7 6  CREATININE 0.74 0.76  CALCIUM 9.0 9.0   GFR: CrCl cannot be calculated (Unknown ideal weight.). Liver Function Tests: Recent Labs  Lab 02/17/19 1449 02/18/19 1609  AST 13* 13*  ALT 21 22  ALKPHOS 71 69  BILITOT 0.8 0.7  PROT 7.6 7.2  ALBUMIN 4.1 4.0   Recent Labs  Lab 02/17/19 1449 02/18/19 1609  LIPASE 28 24   No results for input(s): AMMONIA in the last 168 hours. Coagulation Profile: No results  for input(s): INR, PROTIME in the last 168 hours. Cardiac Enzymes: Recent Labs  Lab 02/17/19 1449 02/18/19 1609  TROPONINI <0.03 <0.03   BNP (last 3 results) No results for input(s): PROBNP in the last 8760 hours. HbA1C: No results for input(s): HGBA1C in the last 72 hours. CBG: No results for input(s): GLUCAP in the last 168 hours. Lipid Profile: No results for input(s): CHOL, HDL, LDLCALC, TRIG, CHOLHDL, LDLDIRECT in the last 72 hours. Thyroid Function Tests: No results for input(s): TSH, T4TOTAL, FREET4, T3FREE, THYROIDAB in the last 72 hours. Anemia Panel: No results for input(s): VITAMINB12, FOLATE, FERRITIN, TIBC, IRON, RETICCTPCT in the last 72 hours. Urine analysis:    Component Value Date/Time   COLORURINE YELLOW 02/17/2019 1641   APPEARANCEUR CLEAR 02/17/2019 1641   LABSPEC 1.013 02/17/2019 1641   PHURINE 7.0 02/17/2019 1641   GLUCOSEU NEGATIVE 02/17/2019 1641   HGBUR NEGATIVE 02/17/2019 1641   BILIRUBINUR NEGATIVE 02/17/2019 1641   KETONESUR NEGATIVE 02/17/2019 1641   PROTEINUR NEGATIVE 02/17/2019 1641   UROBILINOGEN 0.2 08/11/2013 1315   NITRITE NEGATIVE 02/17/2019 1641   LEUKOCYTESUR NEGATIVE 02/17/2019 1641    Radiological Exams on Admission: Dg Chest 2 View  Result Date: 02/17/2019 CLINICAL DATA:  Left-sided chest pain EXAM: CHEST - 2 VIEW COMPARISON:   04/22/2018 FINDINGS: The heart size and mediastinal contours are within normal limits. Both lungs are clear. The visualized skeletal structures are unremarkable. IMPRESSION: No acute abnormality of the lungs. Electronically Signed   By: Lauralyn PrimesAlex  Bibbey M.D.   On: 02/17/2019 15:28   Ct Abdomen Pelvis W Contrast  Result Date: 02/17/2019 CLINICAL DATA:  Abdominal pain and swelling EXAM: CT ABDOMEN AND PELVIS WITH CONTRAST TECHNIQUE: Multidetector CT imaging of the abdomen and pelvis was performed using the standard protocol following bolus administration of intravenous contrast. CONTRAST:  100mL OMNIPAQUE 300 COMPARISON:  06/26/2012 FINDINGS: Lower chest: No acute abnormality. Hepatobiliary: Mild fatty infiltration of the liver is noted. Pancreas: Unremarkable. No pancreatic ductal dilatation or surrounding inflammatory changes. Spleen: Normal in size without focal abnormality. Adrenals/Urinary Tract: Adrenal glands are unremarkable. Kidneys are normal, without renal calculi, focal lesion, or hydronephrosis. Bladder is decompressed. Stomach/Bowel: The appendix has been surgically removed. No obstructive or inflammatory changes are noted. Vascular/Lymphatic: Aortic atherosclerosis. No enlarged abdominal or pelvic lymph nodes. Reproductive: Prostate is unremarkable. Other: No abdominal wall hernia or abnormality. No abdominopelvic ascites. Musculoskeletal: No acute or significant osseous findings. IMPRESSION: No acute abnormality noted. Stable fatty liver. Electronically Signed   By: Alcide CleverMark  Lukens M.D.   On: 02/17/2019 17:41   Dg Chest Portable 1 View  Result Date: 02/18/2019 CLINICAL DATA:  Shortness of breath EXAM: PORTABLE CHEST 1 VIEW COMPARISON:  Feb 17, 2019 FINDINGS: No edema or consolidation. Heart is upper normal in size with pulmonary vascularity normal. No adenopathy. No bone lesions. IMPRESSION: No edema or consolidation.  Stable cardiac silhouette. Electronically Signed   By: Bretta BangWilliam  Woodruff III M.D.    On: 02/18/2019 17:22    EKG: Independently reviewed.  Assessment/Plan Principal Problem:   COPD with acute exacerbation (HCC) Active Problems:   Hypertension   Acute respiratory failure with hypoxia (HCC)   Diabetes mellitus type 2 in obese (HCC)    1. COPD exacerbation - Causing acute resp failure with hypoxia 1. COPD pathway 2. Scheduled LABA, LAMA, and inh steroids 3. Prednisone 4. PRN SABA 5. Cont pulse ox 6. Nicotine patch 7. Discussed need to quit smoking, patient seems to understand that this is the key. 2. HTN -  1. Cont home lasix and losartan 2. No evidence of acute CHF (nl BNP, no pulm edema on CXR) 3. DM2 1. Cont home Metformin 2. Hold Sulfonlylurea 3. Add mod scale SSI AC  DVT prophylaxis: Lovenox Code Status: Full Family Communication: No family in room Disposition Plan: Home after admit Consults called: None Admission status: Admit to inpatient  Severity of Illness: The appropriate patient status for this patient is INPATIENT. Inpatient status is judged to be reasonable and necessary in order to provide the required intensity of service to ensure the patient's safety. The patient's presenting symptoms, physical exam findings, and initial radiographic and laboratory data in the context of their chronic comorbidities is felt to place them at high risk for further clinical deterioration. Furthermore, it is not anticipated that the patient will be medically stable for discharge from the hospital within 2 midnights of admission. The following factors support the patient status of inpatient.   " The patient's presenting symptoms include Cough, SOB. " The worrisome physical exam findings include Wheezing, rhonchi. " The initial radiographic and laboratory data are worrisome because of low O2 sat with new O2 requirement. " The chronic co-morbidities include COPD, DM2, HTN.   * I certify that at the point of admission it is my clinical judgment that the patient will  require inpatient hospital care spanning beyond 2 midnights from the point of admission due to high intensity of service, high risk for further deterioration and high frequency of surveillance required.*    GARDNER, JARED M. DO [facility]  How to contact the [facility] Attending or Consulting provider 7A - 7P or covering provider during after hours 7P -7A, for this patient?  1. Check the care team in [facility] and look for a) attending/consulting [facility] provider listed and b) the [facility] team listed 2. Log into [email or web address]  Amion Physician Scheduling and messaging for groups and whole hospitals  On call and physician scheduling software for group practices, residents, hospitalists and other medical providers for call, clinic, rotation and shift schedules. On[employer] is a hospital-wide system for scheduling doctors and paging doctors on call. EasyPlot is for scientific plotting and data analysis.  [email or web address]  and use [facility]'s universal password to access. If you do not have the password, please contact the hospital operator.  3. Locate the [facility] provider you are looking for under [facility] and page to a number that you can be directly reached. 4. If you still have difficulty reaching the provider, please page the [facility] (Director on Call) for the Hospitalists listed on amion for assistance.  02/18/2019, 7:48 PM

## 2019-02-18 NOTE — ED Provider Notes (Signed)
[facility]-EMERGENCY DEPT Provider Note   CSN: [id number] Arrival date & time: 02/18/19  1157    History   Chief Complaint Chief Complaint  Patient presents with   Abdominal Pain   Headache   Constipation    HPI Steven Dudley is a 43 y.o. male.     The history is provided by the patient and medical records. No language interpreter was used.  Shortness of Breath  Severity:  Severe Onset quality:  Gradual Duration:  1 week Timing:  Constant Progression:  Waxing and waning Chronicity:  Recurrent Context: URI   Relieved by:  Nothing Worsened by:  Exertion and deep breathing Ineffective treatments:  Inhaler Associated symptoms: abdominal pain, chest pain, cough, fever (chills), headaches, vomiting and wheezing   Associated symptoms: no diaphoresis, no hemoptysis, no neck pain, no rash and no sputum production   Cough:    Cough characteristics:  Unable to specify   Severity:  Moderate   Onset quality:  Gradual Wheezing:    Severity:  Severe   Onset quality:  Gradual Risk factors: obesity     Past Medical History:  Diagnosis Date   Acid reflux    Acute respiratory failure (HCC)    Alcohol abuse    Anxiety    Asthma    daily and prn inhalers   CHF (congestive heart failure) (HCC)    Chondromalacia of right patella 09/2014   COPD (chronic obstructive pulmonary disease) (HCC)    Diabetes mellitus without complication (HCC)    TYPE II   Gastritis    Gastritis    History of traumatic brain injury 2006   Hyperglycemia    Hypertension    states under control with med., has been on med. x 1 yr.   Meniscal cyst 09/2014   right knee   Morbid obesity (HCC)    Obesity    Renal disorder    Smokers' cough [facility])     Patient Active Problem List   Diagnosis Date Noted   Acute exacerbation of chronic obstructive pulmonary disease (COPD) (HCC) 04/22/2018   Diabetes mellitus type 2 in obese (HCC) 04/22/2018   Atypical  pneumonia    Diabetes (HCC) 01/11/2016   Prediabetes 09/17/2015   URI (upper respiratory infection) 09/14/2015   Chest pain 09/14/2015   Cough 09/14/2015   Acute respiratory failure with hypoxia (HCC) 09/14/2015   COPD exacerbation (HCC) 09/14/2015   Tobacco use disorder 09/14/2015   Tachycardia 09/14/2015   Hyperglycemia 09/14/2015   Acid reflux    Obesity    Hypertension    COPD (chronic obstructive pulmonary disease) (HCC)     Past Surgical History:  Procedure Laterality Date   APPENDECTOMY     FOREIGN BODY REMOVAL Left 02/27/2008   hand   INCISION AND DRAINAGE ABSCESS Left 03/01/2008   hand   IRRIGATION AND DEBRIDEMENT ABSCESS Left 02/27/2008   hand   KNEE ARTHROSCOPY WITH MEDIAL MENISECTOMY Right 09/25/2014   Procedure: RIGHT KNEE SCOPE CHONDROPLASTY/MEDIAL MENISCECTOMY;  Surgeon: Loreta Ave, MD;  Location: [facility];  Service: Orthopedics;  Laterality: Right;  ANESTHESIA: GENERAL, KNEE BLOCK   TENOSYNOVECTOMY Left 02/27/2008   flexor tenosynovectomy left hand   TONSILLECTOMY     TONSILLECTOMY AND ADENOIDECTOMY     WISDOM TOOTH EXTRACTION          Home Medications    Prior to Admission medications   Medication Sig Start Date End Date Taking? Authorizing Provider  aspirin 81 MG chewable tablet Chew 81  mg by mouth daily.   Yes [provider]  atorvastatin (LIPITOR) 20 MG tablet Take 20 mg by mouth daily.   Yes [provider]  docusate sodium (COLACE) 100 MG capsule Take 1 capsule (100 mg total) by mouth every 12 (twelve) hours. 02/17/19  Yes Arby Barrette, MD  famotidine (PEPCID) 20 MG tablet Take 1 tablet (20 mg total) by mouth 2 (two) times daily. 02/17/19  Yes Arby Barrette, MD  furosemide (LASIX) 20 MG tablet Take 1 tablet (20 mg total) by mouth 2 (two) times daily. Patient taking differently: Take 40 mg by mouth daily.  04/25/18  Yes Richarda Overlie, MD  glipiZIDE (GLUCOTROL XL) 5 MG 24 hr tablet Take 5  mg by mouth daily with breakfast.   Yes [provider]  ipratropium-albuterol (DUONEB) 0.5-2.5 (3) MG/3ML SOLN Take 3 mLs by nebulization every 4 (four) hours as needed (wheezing and shortness of breath).   Yes [provider]  losartan (COZAAR) 50 MG tablet Take 50 mg by mouth daily. 08/25/18  Yes [provider]  metFORMIN (GLUCOPHAGE) 500 MG tablet Take 1 tablet (500 mg total) by mouth 2 (two) times daily with a meal. Patient taking differently: Take 1,000 mg by mouth daily with breakfast.  10/08/15  Yes Henrietta Hoover, NP  ondansetron (ZOFRAN ODT) 4 MG disintegrating tablet Take 1 tablet (4 mg total) by mouth every 4 (four) hours as needed for nausea or vomiting. 02/17/19  Yes Arby Barrette, MD  albuterol (PROVENTIL HFA;VENTOLIN HFA) 108 (90 BASE) MCG/ACT inhaler Inhale 2 puffs into the lungs every 4 (four) hours as needed for wheezing or shortness of breath. Patient not taking: Reported on 01/04/2016 09/17/15   Christiane Ha, MD  busPIRone (BUSPAR) 10 MG tablet Take 1 tablet (10 mg total) by mouth 2 (two) times daily. Patient not taking: Reported on 01/04/2016 09/17/15   Christiane Ha, MD  diclofenac sodium (VOLTAREN) 1 % GEL Apply 4 g topically 4 (four) times daily. Patient not taking: Reported on 02/17/2019 06/22/18   Maczis, Elmer Sow, PA-C  guaiFENesin-dextromethorphan (ROBITUSSIN DM) 100-10 MG/5ML syrup Take 10 mLs by mouth every 6 (six) hours as needed for cough. Patient not taking: Reported on 02/17/2019 04/25/18   Richarda Overlie, MD  methocarbamol (ROBAXIN) 500 MG tablet Take 1 tablet (500 mg total) by mouth 3 (three) times daily between meals as needed. Patient not taking: Reported on 02/17/2019 12/16/16   Rolland Porter, MD  mometasone-formoterol [facility]) 100-5 MCG/ACT AERO Inhale 2 puffs into the lungs 2 (two) times daily. Patient not taking: Reported on 01/04/2016 09/17/15   Christiane Ha, MD  nicotine (NICODERM CQ - DOSED IN MG/24 HOURS) 21  mg/24hr patch Place 1 patch (21 mg total) onto the skin daily. Patient not taking: Reported on 01/04/2016 09/17/15   Christiane Ha, MD  omeprazole (PRILOSEC) 20 MG capsule Take 1 capsule (20 mg total) by mouth daily. Patient not taking: Reported on 02/17/2019 01/22/16   Tilden Fossa, MD  oxyCODONE (ROXICODONE) 5 MG immediate release tablet Take 1 tablet (5 mg total) by mouth every 4 (four) hours as needed for severe pain. Patient not taking: Reported on 02/17/2019 12/16/16   Rolland Porter, MD  promethazine (PHENERGAN) 25 MG tablet Take 1 tablet (25 mg total) by mouth every 6 (six) hours as needed for nausea or vomiting. Patient not taking: Reported on 02/17/2019 01/22/16   Tilden Fossa, MD  sodium chloride (OCEAN) 0.65 % SOLN nasal spray Place 1 spray into both nostrils  as needed for congestion (nose irritation). Patient not taking: Reported on 02/17/2019 04/25/18   Richarda OverlieAbrol, Nayana, MD  sucralfate (CARAFATE) 1 GM/10ML suspension Take 10 mLs (1 g total) by mouth 4 (four) times daily -  with meals and at bedtime. Patient not taking: Reported on 02/17/2019 01/22/16   Tilden Fossaees, Elizabeth, MD  thiamine 100 MG tablet Take 1 tablet (100 mg total) by mouth daily. Patient not taking: Reported on 02/17/2019 04/26/18   Richarda OverlieAbrol, Nayana, MD  traMADol (ULTRAM) 50 MG tablet Take 1 tablet (50 mg total) by mouth every 6 (six) hours as needed. Patient not taking: Reported on 02/17/2019 03/01/16   Alvira MondaySchlossman, Erin, MD    Family History Family History  Problem Relation Age of Onset   Stomach cancer Mother    Cancer Mother     Social History Social History   Tobacco Use   Smoking status: Heavy Tobacco Smoker    Packs/day: 2.00    Years: 26.00    Pack years: 52.00    Types: Cigarettes   Smokeless tobacco: Former NeurosurgeonUser  Substance Use Topics   Alcohol use: Yes    Alcohol/week: 0.0 standard drinks    Comment: Daily   Drug use: Yes    Types: Marijuana     Allergies   Acetaminophen and Shellfish  allergy   Review of Systems Review of Systems  Constitutional: Positive for chills, fatigue and fever (chills). Negative for diaphoresis.  HENT: Negative for congestion.   Eyes: Negative for visual disturbance.  Respiratory: Positive for cough, chest tightness, shortness of breath and wheezing. Negative for hemoptysis, sputum production, choking and stridor.   Cardiovascular: Positive for chest pain and leg swelling. Negative for palpitations.  Gastrointestinal: Positive for abdominal pain, nausea and vomiting. Negative for constipation and diarrhea.  Genitourinary: Negative for dysuria and frequency.  Musculoskeletal: Negative for back pain, neck pain and neck stiffness.  Skin: Negative for rash and wound.  Neurological: Positive for headaches. Negative for dizziness and light-headedness.  Psychiatric/Behavioral: Negative for agitation.  All other systems reviewed and are negative.    Physical Exam Updated Vital Signs BP 124/76 (BP Location: Left Arm)    Pulse 95    Temp 99 F (37.2 C) (Oral)    Resp 16    SpO2 90%   Physical Exam Vitals signs and nursing note reviewed.  Constitutional:      General: He is not in acute distress.    Appearance: He is well-developed. He is not ill-appearing, toxic-appearing or diaphoretic.  HENT:     Head: Normocephalic and atraumatic.  Eyes:     Conjunctiva/sclera: Conjunctivae normal.  Neck:     Musculoskeletal: Neck supple.  Cardiovascular:     Rate and Rhythm: Normal rate and regular rhythm.     Heart sounds: No murmur.  Pulmonary:     Effort: No respiratory distress.     Breath sounds: No stridor. Wheezing, rhonchi and rales present.  Chest:     Chest wall: No tenderness.  Abdominal:     General: Bowel sounds are normal.     Palpations: Abdomen is soft.     Tenderness: There is no abdominal tenderness. There is no right CVA tenderness or left CVA tenderness.  Skin:    General: Skin is warm and dry.     Capillary Refill: Capillary  refill takes less than 2 seconds.     Findings: No rash.  Neurological:     General: No focal deficit present.     Mental Status: He  is alert.      ED Treatments / Results  Labs (all labs ordered are listed, but only abnormal results are displayed) Labs Reviewed  CBC WITH DIFFERENTIAL/PLATELET - Abnormal; Notable for the following components:      Result Value   RBC 6.05 (*)    Hemoglobin 18.2 (*)    HCT 57.7 (*)    Abs Immature Granulocytes 0.09 (*)    All other components within normal limits  COMPREHENSIVE METABOLIC PANEL - Abnormal; Notable for the following components:   Chloride 96 (*)    CO2 34 (*)    Glucose, Bld 133 (*)    AST 13 (*)    All other components within normal limits  SARS CORONAVIRUS 2 (HOSPITAL ORDER, PERFORMED IN [facility])  LIPASE, BLOOD  TROPONIN I  BRAIN NATRIURETIC PEPTIDE  D-DIMER, QUANTITATIVE (NOT AT [facility])  HIV ANTIBODY (ROUTINE TESTING W REFLEX)  CBC  BASIC METABOLIC PANEL    EKG EKG Interpretation  Date/Time:  Monday Feb 18 2019 16:57:46 EDT Ventricular Rate:  84 PR Interval:    QRS Duration: 88 QT Interval:  377 QTC Calculation: 446 R Axis:   84 Text Interpretation:  Sinus rhythm Anteroseptal infarct, age indeterminate When compared to prior, t wave inverison now present in lead V1.  No STEMI Confirmed by Theda Belfast ([id number]) on 02/18/2019 6:54:09 PM   Radiology Dg Chest 2 View  Result Date: 02/17/2019 CLINICAL DATA:  Left-sided chest pain EXAM: CHEST - 2 VIEW COMPARISON:  04/22/2018 FINDINGS: The heart size and mediastinal contours are within normal limits. Both lungs are clear. The visualized skeletal structures are unremarkable. IMPRESSION: No acute abnormality of the lungs. Electronically Signed   By: Lauralyn Primes M.D.   On: 02/17/2019 15:28   Ct Abdomen Pelvis W Contrast  Result Date: 02/17/2019 CLINICAL DATA:  Abdominal pain and swelling EXAM: CT ABDOMEN AND PELVIS WITH CONTRAST TECHNIQUE: Multidetector CT  imaging of the abdomen and pelvis was performed using the standard protocol following bolus administration of intravenous contrast. CONTRAST:  OMNIPAQUE 300 COMPARISON:  06/26/2012 FINDINGS: Lower chest: No acute abnormality. Hepatobiliary: Mild fatty infiltration of the liver is noted. Pancreas: Unremarkable. No pancreatic ductal dilatation or surrounding inflammatory changes. Spleen: Normal in size without focal abnormality. Adrenals/Urinary Tract: Adrenal glands are unremarkable. Kidneys are normal, without renal calculi, focal lesion, or hydronephrosis. Bladder is decompressed. Stomach/Bowel: The appendix has been surgically removed. No obstructive or inflammatory changes are noted. Vascular/Lymphatic: Aortic atherosclerosis. No enlarged abdominal or pelvic lymph nodes. Reproductive: Prostate is unremarkable. Other: No abdominal wall hernia or abnormality. No abdominopelvic ascites. Musculoskeletal: No acute or significant osseous findings. IMPRESSION: No acute abnormality noted. Stable fatty liver. Electronically Signed   By: Alcide Clever M.D.   On: 02/17/2019 17:41    Procedures Procedures (including critical care time)  CRITICAL CARE Performed by: Canary Brim Nyiah Pianka Total critical care time: 35 minutes Critical care time was exclusive of separately billable procedures and treating other patients. Critical care was necessary to treat or prevent imminent or life-threatening deterioration. Critical care was time spent personally by me on the following activities: development of treatment plan with patient and/or surrogate as well as nursing, discussions with consultants, evaluation of patient's response to treatment, examination of patient, obtaining history from patient or surrogate, ordering and performing treatments and interventions, ordering and review of laboratory studies, ordering and review of radiographic studies, pulse oximetry and re-evaluation of patient's condition.  Steven Dudley was evaluated in Emergency Department on  02/18/2019 for the symptoms described in the history of present illness. He was evaluated in the context of the global COVID-19 pandemic, which necessitated consideration that the patient might be at risk for infection with the SARS-CoV-2 virus that causes COVID-19. Institutional protocols and algorithms that pertain to the evaluation of patients at risk for COVID-19 are in a state of rapid change based on information released by regulatory bodies including the CDC and federal and state organizations. These policies and algorithms were followed during the patient's care in the ED.   Medications Ordered in ED Medications  nicotine (NICODERM CQ - dosed in mg/24 hours) patch 14 mg (14 mg Transdermal Patch Applied 02/18/19 1852)  nicotine (NICODERM CQ - dosed in mg/24 hours) patch 21 mg (has no administration in time range)  enoxaparin (LOVENOX) injection 40 mg (has no administration in time range)  predniSONE (DELTASONE) tablet 40 mg (has no administration in time range)  mometasone-formoterol (DULERA) 200-5 MCG/ACT inhaler 1 puff (has no administration in time range)  umeclidinium bromide (INCRUSE ELLIPTA) 62.5 MCG/INH 1 puff (has no administration in time range)  albuterol (PROVENTIL) (2.5 MG/3ML) 0.083% nebulizer solution 2.5 mg (has no administration in time range)  cefTRIAXone (ROCEPHIN) 1 g in sodium chloride 0.9 % 100 mL IVPB (1 g Intravenous New Bag/Given (Non-Interop) 02/18/19 2013)  aspirin chewable tablet 81 mg (has no administration in time range)  atorvastatin (LIPITOR) tablet 20 mg (has no administration in time range)  docusate sodium (COLACE) capsule 100 mg (has no administration in time range)  famotidine (PEPCID) tablet 20 mg (has no administration in time range)  metFORMIN (GLUCOPHAGE) tablet 1,000 mg (has no administration in time range)  losartan (COZAAR) tablet 50 mg (has no administration in time range)  furosemide (LASIX) tablet  40 mg (has no administration in time range)  ondansetron (ZOFRAN-ODT) disintegrating tablet 4 mg (has no administration in time range)  insulin aspart (novoLOG) injection 0-15 Units (has no administration in time range)  LORazepam (ATIVAN) tablet 1 mg (has no administration in time range)    Or  LORazepam (ATIVAN) injection 1 mg (has no administration in time range)  thiamine (VITAMIN B-1) tablet 100 mg (has no administration in time range)    Or  thiamine (B-1) injection 100 mg (has no administration in time range)  folic acid (FOLVITE) tablet 1 mg (has no administration in time range)  multivitamin with minerals tablet 1 tablet (has no administration in time range)  ondansetron (ZOFRAN) injection 4 mg (has no administration in time range)  albuterol (VENTOLIN HFA) 108 (90 Base) MCG/ACT inhaler 2 puff (2 puffs Inhalation Given 02/18/19 1631)  ipratropium (ATROVENT HFA) inhaler 2 puff (2 puffs Inhalation Given 02/18/19 1629)  ondansetron (ZOFRAN) injection 4 mg (4 mg Intravenous Given 02/18/19 1658)  morphine 4 MG/ML injection 4 mg (4 mg Intravenous Given 02/18/19 1700)  morphine 4 MG/ML injection 4 mg (4 mg Intravenous Given 02/18/19 1854)  ondansetron (ZOFRAN) injection 4 mg (4 mg Intravenous Given 02/18/19 1853)  methylPREDNISolone sodium succinate (SOLU-MEDROL) 125 mg/2 mL injection 125 mg (125 mg Intravenous Given 02/18/19 2011)     Initial Impression / Assessment and Plan / ED Course  I have reviewed the triage vital signs and the nursing notes.  Pertinent labs & imaging results that were available during my care of the patient were reviewed by me and considered in my medical decision making (see chart for details).        Dodd Schmid is a 43 y.o. male with a  past medical history significant for hypertension, obesity, anxiety, asthma, COPD, diabetes, CHF, and alcohol abuse who presents back to the hospital for admission for hypoxia, malaise, chills, cough, shortness of breath, and  abdominal discomfort with nausea and vomiting.  He reports that he had a work-up yesterday including CT of the abdomen which was overall reassuring aside from some liver disease.  He reports he has had nausea and vomiting continue for the last several days.  He reports overnight he had more nausea but no vomiting.  He reports no constipation or diarrhea and still passing gas.  He reports that his legs are mildly edematous but improved from the past.  He reports he is taking his diuretic.  He reports that he has used home inhalers that sometimes helps his breathing but he reports he is "always in the 80s" on oxygen saturations.  He reports the 71s and 60s are new to him.  He reports some chest discomfort yesterday and mild chest discomfort continuing today.  He reports his symptoms are worsened with exertion.  He reports cough but denies any production of the cough.  He reports chills but no fevers at home.  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  He denies any urinary symptoms.  Chart review shows that patient was offered admission yesterday because he was found have oxygen saturations in the 60s and 70s persistently on room air.  He reports he does not take oxygen at home.  He was having chest pain with these abnormalities however, due to Mother's Day, patient reports he could not be admitted.  Now patient returns for admission.  On my initial evaluation, oxygen saturation was 72% on room air.  Patient was very short of breath on my initial evaluation.  Patient was placed on 5 L nasal cannula with improvement in oxygen saturations into the upper 90s.    Patient's exam had some wheezing, coarse breath sounds, and crackles in his lungs.  Patient will be given albuterol and Atrovent.  Abdomen was nontender.  Legs had mild edema bilaterally.  No leg tenderness.  Abdomen was nontender on my exam.  CT yesterday was reassuring, do not feel needs repeat abdominal  imaging however will repeat some lab test.  With his history of CHF and some edema, will obtain a BNP.  Will obtain d-dimer given his hypoxia with shortness of breath and chest discomfort.  Repeat chest x-ray be obtained.  Given his new oxygen requirement, patient will require admission.  7:11 PM Diagnostic testing began to return.  Coronavirus test was negative.  CBC shows no leukocytosis and hemoglobin is elevated.  This is likely hemoconcentration.  BNP is not elevated.  Kidney function and liver function are normal.  D-dimer is negative.  Troponin was negative.  Chest x-ray shows no pneumonia.  Given the patient's new oxygen requirement and persistent hypoxia in the setting of wheezing, I think this is likely a COPD exacerbation.  He was given steroids and reports he is starting to feel better.  Feel he needs admission for COPD exacerbation and further troponin trending given the chest discomfort.  Patient will be admitted for further management of COPD exacerbation with oxygen requirement.   Final Clinical Impressions(s) / ED Diagnoses   Final diagnoses:  Hypoxia  Shortness of breath  Chills  Cough  COPD exacerbation (HCC)  Chest pain, unspecified type    ED Discharge Orders    None      Clinical Impression:  1. Hypoxia   2. Shortness of breath   3. Chills   4. Cough   5. COPD exacerbation (HCC)   6. Chest pain, unspecified type     Disposition: Admit  This note was prepared with assistance of Dragon voice recognition software. Occasional wrong-word or sound-a-like substitutions may have occurred due to the inherent limitations of voice recognition software.     Jamar Casagrande, Canary Brim, MD 02/18/19 2043

## 2019-02-18 NOTE — ED Notes (Signed)
Bed: WTR6 Expected date:  Expected time:  Means of arrival:  Comments: 

## 2019-02-18 NOTE — ED Triage Notes (Signed)
Pt reports that he was seen here yesterday for his abd pains and constipation and left to spend time with family when they recommended being admitted. Reports that he felt worse last night after taking the prescribed medications which caused a migraine. Pt reports came back today to be admitted.

## 2019-02-19 LAB — CBC
HCT: 60 % — ABNORMAL HIGH (ref 39.0–52.0)
Hemoglobin: 18.6 g/dL — ABNORMAL HIGH (ref 13.0–17.0)
MCH: 29.6 pg (ref 26.0–34.0)
MCHC: 31 g/dL (ref 30.0–36.0)
MCV: 95.5 fL (ref 80.0–100.0)
Platelets: 223 10*3/uL (ref 150–400)
RBC: 6.28 MIL/uL — ABNORMAL HIGH (ref 4.22–5.81)
RDW: 13.7 % (ref 11.5–15.5)
WBC: 10.1 10*3/uL (ref 4.0–10.5)
nRBC: 0 % (ref 0.0–0.2)

## 2019-02-19 LAB — BASIC METABOLIC PANEL
Anion gap: 8 (ref 5–15)
BUN: 10 mg/dL (ref 6–20)
CO2: 35 mmol/L — ABNORMAL HIGH (ref 22–32)
Calcium: 9 mg/dL (ref 8.9–10.3)
Chloride: 92 mmol/L — ABNORMAL LOW (ref 98–111)
Creatinine, Ser: 1 mg/dL (ref 0.61–1.24)
GFR calc Af Amer: >60 mL/min (ref >60)
GFR calc non Af Amer: >60 mL/min (ref >60)
Glucose, Bld: 272 mg/dL — ABNORMAL HIGH (ref 70–99)
Potassium: 5 mmol/L (ref 3.5–5.1)
Sodium: 135 mmol/L (ref 135–145)

## 2019-02-19 LAB — GLUCOSE, CAPILLARY
Glucose-Capillary: 237 mg/dL — ABNORMAL HIGH (ref 70–99)
Glucose-Capillary: 198 mg/dL — ABNORMAL HIGH (ref 70–99)
Glucose-Capillary: 252 mg/dL — ABNORMAL HIGH (ref 70–99)
Glucose-Capillary: 234 mg/dL — ABNORMAL HIGH (ref 70–99)

## 2019-02-19 MED ORDER — ORAL CARE MOUTH RINSE
15.0000 mL | Freq: Two times a day (BID) | OROMUCOSAL | Status: DC
  Administered 2019-02-19 – 2019-02-20 (×2): 15 mL via OROMUCOSAL

## 2019-02-19 NOTE — Progress Notes (Signed)
PROGRESS NOTE    Steven Dudley  HYQ:[id number] DOB: June 14, 1976 DOA: 02/18/2019 PCP: Macy Mis, MD   Brief Narrative: 43 year old male with history of COPD, type 2 diabetes, hypertension, CHF, active smoker 2 PPD, history of chronic hypoxic tori failure not using oxygen, OSA not using CPAP presented with one-week history of worsening shortness of breath wheezing and cough along with nausea and vomiting. In the ER COVID-19 negative, found to be hypoxic and was admitted. Patient reported he usually saturates in the 80s but in the ER he was in 38s  Subjective: Came off CPAP this am after being on it thiS am for hypoxia, Sob. Denies cp/n/v/abd pain. Not on home o2, o2 runs in 80s at home apparently and was refusing o2 before. Did not tolerate CPAP at home and not using it.  Assessment & Plan:   COPD with acute exacerbation:  Still remains short of breath overall seems to be improving. We will continue on prednisone,LABA,LAMA ,nebs ICS, rocephin, and supplemental oxygen.We discussed about smoking cessation.  Hypoxic respiratory failure suspect chronic, has been hypoxic in 80s apparently at home and has been refusing oxygen placement.  Discussed in detail noted secondary polycythemia.  He hass agreed for oxygen at this time. Will need to qualify for home oxygen prior to discharge.  Wean oxygen as tolerated.  Morbid Obesity w BMI 41: Advised weight loss, healthy lifestyle.   Hypertension: Blood pressure controlled  Diabetes mellitus type 2 in obese on OHA at home,  Last hba1c 7.8 in 04/2018-recheck. Cont ssi, OHA, and while on steroid if sugar remains poorly controlled will start Lantus.  Hx of CHF- compensated, cont po lasix HLD-cont statin.  DVT prophylaxis: SCD/Heparin/lovenox Code Status: full Family Communication: family at bedside Disposition Plan: remains inpatient pending clinical improvement.    Consultants:   Procedures:  Antimicrobials: Anti-infectives (From  admission, onward)   Start     Dose/Rate Route Frequency Ordered Stop   02/18/19 2000  cefTRIAXone (ROCEPHIN) 1 g in sodium chloride 0.9 % 100 mL IVPB     1 g 200 mL/hr over 30 Minutes Intravenous Every 24 hours 02/18/19 1941 02/23/19 1959       Objective: Vitals:   02/18/19 2128 02/19/19 0000 02/19/19 0253 02/19/19 0614  BP: 127/77   125/67  Pulse: 80  83 83  Resp: Temp: 98 F (36.7 C)   98.6 F (37 C)  TempSrc: Oral   Oral  SpO2: 94% 94% 93% 93%  Weight: 122.9 kg     Height:  (1.727 m)       Intake/Output Summary (Last 24 hours) at 02/19/2019 1014 Last data filed at 02/19/2019 0600 Gross per 24 hour  Intake 700 ml  Output 350 ml  Net 350 ml   Filed Weights   02/18/19 2128  Weight: 122.9 kg   Weight change:   Body mass index is 41.2 kg/m.  Intake/Output from previous day: 05/11 0701 - 05/12 0700 In: 700 [P.O.:600; IV Piggyback:100] Out: 350 [Urine:350] Intake/Output this shift: No intake/output data recorded.  Examination:  General exam: Appears calm and comfortable,Not in distress, morbidly obese  Radiology none HEENT:PERRL,Oral mucosa moist, Ear/Nose normal on gross exam Respiratory system: Bilateral diminished breath sound, no wheezing  Cardiovascular system: S1 & S2 heard,No JVD, murmurs. Gastrointestinal system: Abdomen is  soft, non tender, non distended, BS +  Nervous System:Alert and oriented. No focal neurological deficits/moving extremities, sensation intact. Extremities: No edema, no clubbing, distal peripheral pulses palpable. Skin:  No rashes, lesions, no icterus MSK: Normal muscle bulk,tone ,power  Medications:  Scheduled Meds: . aspirin  81 mg Oral Daily  . atorvastatin  20 mg Oral Daily  . docusate sodium  100 mg Oral Q12H  . enoxaparin (LOVENOX) injection  60 mg Subcutaneous Q24H  . famotidine  20 mg Oral BID  . folic acid  1 mg Oral Daily  . furosemide  40 mg Oral Daily  . insulin aspart  0-15 Units Subcutaneous TID  WC  . losartan  50 mg Oral Daily  . mouth rinse  15 mL Mouth Rinse BID  . metFORMIN  1,000 mg Oral Q breakfast  . mometasone-formoterol  1 puff Inhalation BID  . multivitamin with minerals  1 tablet Oral Daily  . nicotine  14 mg Transdermal Once  . nicotine  21 mg Transdermal Daily  . predniSONE  40 mg Oral Q breakfast  . thiamine  100 mg Oral Daily   Or  . thiamine  100 mg Intravenous Daily  . umeclidinium bromide  1 puff Inhalation Daily   Continuous Infusions: . cefTRIAXone (ROCEPHIN)  IV Stopped (02/18/19 2043)    Data Reviewed: I have personally reviewed following labs and imaging studies  CBC: Recent Labs  Lab 02/17/19 1449 02/18/19 1609 02/19/19 0424  WBC 9.9 9.2 10.1  NEUTROABS  --  6.0  --   HGB 18.7* 18.2* 18.6*  HCT 56.9* 57.7* 60.0*  MCV 93.0 95.4 95.5  PLT 220 213 223   Basic Metabolic Panel: Recent Labs  Lab 02/17/19 1449 02/18/19 1609 02/19/19 0424  NA 138 141 135  K 3.7 3.8 5.0  CL 94* 96* 92*  CO2 33* 34* 35*  GLUCOSE 176* 133* 272*  BUN 7 6 10   CREATININE 0.74 0.76 1.00  CALCIUM 9.0 9.0 9.0   GFR: Estimated Creatinine Clearance: 124 mL/min (by C-G formula based on SCr of 1 mg/dL). Liver Function Tests: Recent Labs  Lab 02/17/19 1449 02/18/19 1609  AST 13* 13*  ALT 21 22  ALKPHOS 71 69  BILITOT 0.8 0.7  PROT 7.6 7.2  ALBUMIN 4.1 4.0   Recent Labs  Lab 02/17/19 1449 02/18/19 1609  LIPASE 28 24   No results for input(s): AMMONIA in the last 168 hours. Coagulation Profile: No results for input(s): INR, PROTIME in the last 168 hours. Cardiac Enzymes: Recent Labs  Lab 02/17/19 1449 02/18/19 1609  TROPONINI <0.03 <0.03   BNP (last 3 results) No results for input(s): PROBNP in the last 8760 hours. HbA1C: No results for input(s): HGBA1C in the last 72 hours. CBG: Recent Labs  Lab 02/18/19 2144 02/19/19 0745  GLUCAP 128* 237*   Lipid Profile: No results for input(s): CHOL, HDL, LDLCALC, TRIG, CHOLHDL, LDLDIRECT in the  last 72 hours. Thyroid Function Tests: No results for input(s): TSH, T4TOTAL, FREET4, T3FREE, THYROIDAB in the last 72 hours. Anemia Panel: No results for input(s): VITAMINB12, FOLATE, FERRITIN, TIBC, IRON, RETICCTPCT in the last 72 hours. Sepsis Labs: No results for input(s): PROCALCITON, LATICACIDVEN in the last 168 hours.  Recent Results (from the past 240 hour(s))  SARS Coronavirus 2 (CEPHEID- [facility]), Hosp Order     Status: None   Collection Time: 02/18/19  4:11 PM  Result Value Ref Range Status   SARS Coronavirus 2 NEGATIVE NEGATIVE Final    Comment: (NOTE) If result is NEGATIVE SARS-CoV-2 target nucleic acids are NOT DETECTED. The SARS-CoV-2 RNA is generally detectable in upper and lower  respiratory specimens  during the acute phase of infection. The lowest  concentration of SARS-CoV-2 viral copies this assay can detect is 250  copies / mL. A negative result does not preclude SARS-CoV-2 infection  and should not be used as the sole basis for treatment or other  patient management decisions.  A negative result may occur with  improper specimen collection / handling, submission of specimen other  than nasopharyngeal swab, presence of viral mutation(s) within the  areas targeted by this assay, and inadequate number of viral copies  (<250 copies / mL). A negative result must be combined with clinical  observations, patient history, and epidemiological information. If result is POSITIVE SARS-CoV-2 target nucleic acids are DETECTED. The SARS-CoV-2 RNA is generally detectable in upper and lower  respiratory specimens dur ing the acute phase of infection.  Positive  results are indicative of active infection with SARS-CoV-2.  Clinical  correlation with patient history and other diagnostic information is  necessary to determine patient infection status.  Positive results do  not rule out bacterial infection or co-infection with other viruses. If result  is PRESUMPTIVE POSTIVE SARS-CoV-2 nucleic acids MAY BE PRESENT.   A presumptive positive result was obtained on the submitted specimen  and confirmed on repeat testing.  While 2019 novel coronavirus  (SARS-CoV-2) nucleic acids may be present in the submitted sample  additional confirmatory testing may be necessary for epidemiological  and / or clinical management purposes  to differentiate between  SARS-CoV-2 and other Sarbecovirus currently known to infect humans.  If clinically indicated additional testing with an alternate test  methodology [phone]) is advised. The SARS-CoV-2 RNA is generally  detectable in upper and lower respiratory sp ecimens during the acute  phase of infection. The expected result is Negative. Fact Sheet for Patients:  BoilerBrush.com.cy Fact Sheet for Healthcare Providers: https://pope.com/ This test is not yet approved or cleared by the [place] FDA and has been authorized for detection and/or diagnosis of SARS-CoV-2 by FDA under an Emergency Use Authorization (EUA).  This EUA will remain in effect (meaning this test can be used) for the duration of the COVID-19 declaration under Section 564(b)(1) of the Act, 21 U.S.C. section 360bbb-3(b)(1), unless the authorization is terminated or revoked sooner. Performed at [facility], [address] [place], [place] [address]       Radiology Studies: Dg Chest 2 View  Result Date: 02/17/2019 CLINICAL DATA:  Left-sided chest pain EXAM: CHEST - 2 VIEW COMPARISON:  04/22/2018 FINDINGS: The heart size and mediastinal contours are within normal limits. Both lungs are clear. The visualized skeletal structures are unremarkable. IMPRESSION: No acute abnormality of the lungs. Electronically Signed   By: Lauralyn Primes M.D.   On: 02/17/2019 15:28   Ct Abdomen Pelvis W Contrast  Result Date: 02/17/2019 CLINICAL DATA:  Abdominal pain and swelling EXAM: CT  ABDOMEN AND PELVIS WITH CONTRAST TECHNIQUE: Multidetector CT imaging of the abdomen and pelvis was performed using the standard protocol following bolus administration of intravenous contrast. CONTRAST:  OMNIPAQUE 300 COMPARISON:  06/26/2012 FINDINGS: Lower chest: No acute abnormality. Hepatobiliary: Mild fatty infiltration of the liver is noted. Pancreas: Unremarkable. No pancreatic ductal dilatation or surrounding inflammatory changes. Spleen: Normal in size without focal abnormality. Adrenals/Urinary Tract: Adrenal glands are unremarkable. Kidneys are normal, without renal calculi, focal lesion, or hydronephrosis. Bladder is decompressed. Stomach/Bowel: The appendix has been surgically removed. No obstructive or inflammatory changes are noted. Vascular/Lymphatic: Aortic atherosclerosis. No enlarged abdominal or pelvic lymph nodes. Reproductive: Prostate is  unremarkable. Other: No abdominal wall hernia or abnormality. No abdominopelvic ascites. Musculoskeletal: No acute or significant osseous findings. IMPRESSION: No acute abnormality noted. Stable fatty liver. Electronically Signed   By: Alcide Clever M.D.   On: 02/17/2019 17:41   Dg Chest Portable 1 View  Result Date: 02/18/2019 CLINICAL DATA:  Shortness of breath EXAM: PORTABLE CHEST 1 VIEW COMPARISON:  Feb 17, 2019 FINDINGS: No edema or consolidation. Heart is upper normal in size with pulmonary vascularity normal. No adenopathy. No bone lesions. IMPRESSION: No edema or consolidation.  Stable cardiac silhouette. Electronically Signed   By: Bretta Bang III M.D.   On: 02/18/2019 17:22    LOS: 1 day   Time spent: More than 50% of that time was spent in counseling and/or coordination of care.  Lanae Boast, MD [facility]  02/19/2019, 10:14 AM

## 2019-02-19 NOTE — Evaluation (Addendum)
Physical Therapy Evaluation Patient Details Name: Steven Dudley MRN: [id number] DOB: 1976/03/21 Today's Date: 02/19/2019   History of Present Illness  43 y.o. male with medical history significant of COPD, DM2, HTN, CHF.  Patient still smoking 2 PPD.  Patient presents to the ED with 1 week history of worsening SOB, wheezing, cough, N/V.   Clinical Impression  Pt ambulated 400' without an assistive device, no loss of balance. SaO2 85% on room air walking, 90% on 4L walking. Pt reports he only uses home O2 when he's having, "a bad day", and that he, "lives in the 70s". 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 He verbalized understanding of the benefits of using supplemental O2.  From PT standpoint, he is ready to DC home. No further PT indicated as pt is independent with mobility.     Follow Up Recommendations No PT follow up    Equipment Recommendations  None recommended by PT    Recommendations for Other Services       Precautions / Restrictions Precautions Precautions: Other (comment) Precaution Comments: monitor O2 Restrictions Weight Bearing Restrictions: No      Mobility  Bed Mobility Overal bed mobility: Independent                Transfers Overall transfer level: Independent                  Ambulation/Gait Ambulation/Gait assistance: Independent Gait Distance (Feet): 400 Feet Assistive device: None Gait Pattern/deviations: WFL(Within Functional Limits) Gait velocity: WNL   General Gait Details: no loss of balance, SaO2 85% on room air walking, 91% on 4L O2 walking, 1/4 dyspnea,  pt stated he "lives in the 6470s" at baseline  Stairs            Wheelchair Mobility    Modified Rankin (Stroke Patients Only)       Balance Overall balance assessment: Independent                                           Pertinent Vitals/Pain Pain Assessment: No/denies  pain    Home Living Family/patient expects to be discharged to:: Private residence Living Arrangements: Spouse/significant other;Children Available Help at Discharge: Family Type of Home: House Home Access: Stairs to enter   Secretary/administratorntrance Stairs-Number of Steps: 1 Home Layout: One level Home Equipment: Other (comment) Additional Comments: nebulizer, pulsoximier, CBG monitor, home O2 (only uses occaissionally)    Prior Function Level of Independence: Independent         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     Hand Dominance   Dominant Hand: Right    Extremity/Trunk Assessment   Upper Extremity Assessment Upper Extremity Assessment: Overall WFL for tasks assessed    Lower Extremity Assessment Lower Extremity Assessment: Overall WFL for tasks assessed    Cervical / Trunk Assessment Cervical / Trunk Assessment: Normal  Communication   Communication: No difficulties  Cognition Arousal/Alertness: Awake/alert Behavior During Therapy: WFL for tasks assessed/performed Overall Cognitive Status: Within Functional Limits for tasks assessed                                        General Comments      Exercises  Assessment/Plan    PT Assessment Patent does not need any further PT services  PT Problem List         PT Treatment Interventions      PT Goals (Current goals can be found in the Care Plan section)  Acute Rehab PT Goals Patient Stated Goal: return to work PT Goal Formulation: All assessment and education complete, DC therapy    Frequency     Barriers to discharge        Co-evaluation               AM-PAC PT "6 Clicks" Mobility  Outcome Measure Help needed turning from your back to your side while in a flat bed without using bedrails?: None Help needed moving from lying on your back to sitting on the side of a flat bed without using bedrails?: None Help needed moving to and from a bed to a chair (including a  wheelchair)?: None Help needed standing up from a chair using your arms (e.g., wheelchair or bedside chair)?: None Help needed to walk in hospital room?: None Help needed climbing 3-5 steps with a railing? : None 6 Click Score: 24    End of Session Equipment Utilized During Treatment: Gait belt Activity Tolerance: Patient tolerated treatment well Patient left: in chair Nurse Communication: Mobility status      Time: 3664-4034 PT Time Calculation (min) (ACUTE ONLY): 16 min   Charges:   PT Evaluation $PT Eval Low Complexity: 1 Low          Tamala Ser PT 02/19/2019  Acute Rehabilitation Services Pager [phone] Office [phone]

## 2019-02-19 NOTE — Progress Notes (Signed)
OT Cancellation Note  Patient Details Name: Steven Dudley MRN: [id number] DOB: 1975-12-28   Cancelled Treatment:    Reason Eval/Treat Not Completed: OT screened, no needs identified, will sign off. Pt independent with PT.  States he takes his time and paces himself. He doesn't use 02 on the job, but would be willing to if he could get a little 02 cross body unit he could wear instead of having a tank.  Yonis Carreon 02/19/2019, 1:14 PM  Marica Otter, OTR/L Acute Rehabilitation Services [phone] [facility] pager [phone] office 02/19/2019

## 2019-02-19 NOTE — Progress Notes (Signed)
SATURATION QUALIFICATIONS: (This note is used to comply with regulatory documentation for home oxygen)  Patient Saturations on Room Air at Rest = 90%  Patient Saturations on Room Air while Ambulating =85%  Patient Saturations on 4 Liters of oxygen while Ambulating = 90%  Please briefly explain why patient needs home oxygen: to maintain appropriate SaO2 levels with activity.   Ralene Bathe Kistler PT 02/19/2019  Acute Rehabilitation Services Pager [phone] Office [phone]

## 2019-02-19 NOTE — Progress Notes (Signed)
Nutrition Brief Note RD working remotely.  Consult received for assessment of nutrition requirements and status.   Wt Readings from Last 15 Encounters:  02/18/19 122.9 kg  02/17/19 125.8 kg  06/22/18 122.5 kg  04/24/18 123.7 kg  12/16/16 131.5 kg  03/01/16 127 kg  01/22/16 127 kg  01/04/16 126.1 kg  10/08/15 126.1 kg  09/17/15 122.4 kg  01/12/15 125.6 kg  09/25/14 126.6 kg  09/01/14 120.2 kg  08/27/14 120.2 kg  08/11/13 108.9 kg    Body mass index is 41.2 kg/m. Patient meets criteria for morbid obesity based on current BMI. Skin WDL. Patient with medical hx which includes COPD and he was admitted for new O2 requirement/COPD exacerbation.   Current diet order is Heart Healthy/Carb Modified and patient consumed 100% of breakfast this AM. Labs and medications reviewed.   No nutrition interventions warranted at this time. If nutrition issues arise, please re-consult RD.     Trenton Gammon, MS, RD, LDN, [facility] Inpatient Clinical Dietitian Pager # [phone] After hours/weekend pager # [phone]

## 2019-02-19 NOTE — Progress Notes (Signed)
Pt was on 4 liters [place] at HS, desats into the 70's. Changed to venti mask at 55% (14 liters), still desats into 70's. Order obtained for cpap, pt placed on cpap with 6 liters bled into line, he has sats in the mid 90's since. Erick Colace, RN

## 2019-02-20 LAB — GLUCOSE, CAPILLARY
Glucose-Capillary: 150 mg/dL — ABNORMAL HIGH (ref 70–99)
Glucose-Capillary: 211 mg/dL — ABNORMAL HIGH (ref 70–99)

## 2019-02-20 LAB — HIV ANTIBODY (ROUTINE TESTING W REFLEX): HIV Screen 4th Generation wRfx: NONREACTIVE

## 2019-02-20 MED ORDER — ALBUTEROL SULFATE HFA 108 (90 BASE) MCG/ACT IN AERS: 2.0000 | INHALATION_SPRAY | Freq: Four times a day (QID) | RESPIRATORY_TRACT | 1 refills | Status: DC | PRN

## 2019-02-20 MED ORDER — PREDNISONE 20 MG PO TABS: 40.0000 mg | ORAL_TABLET | Freq: Every day | ORAL | 0 refills | Status: AC

## 2019-02-20 MED ORDER — DOXYCYCLINE HYCLATE 50 MG PO CAPS: 50.0000 mg | ORAL_CAPSULE | Freq: Two times a day (BID) | ORAL | 0 refills | Status: AC

## 2019-02-20 MED ORDER — IPRATROPIUM-ALBUTEROL 0.5-2.5 (3) MG/3ML IN SOLN: 3.0000 mL | RESPIRATORY_TRACT | 0 refills | Status: DC | PRN

## 2019-02-20 MED ORDER — MOMETASONE FURO-FORMOTEROL FUM 200-5 MCG/ACT IN AERO: 1.0000 | INHALATION_SPRAY | Freq: Two times a day (BID) | RESPIRATORY_TRACT | 0 refills | Status: DC

## 2019-02-20 NOTE — Discharge Summary (Signed)
Physician Discharge Summary  Steven Dudley WUJ:[id number] DOB: 09/18/1976 DOA: 02/18/2019  PCP: Macy MisBriscoe, Kim K, MD  Admit date: 02/18/2019 Discharge date: 02/20/2019  Admitted From: Home Disposition: Home  Recommendations for Outpatient Follow-up:  1. Follow up with PCP in 1 week 2. Please obtain BMP/CBC in one week 3. Recommend sleep study/CPAP if able to arrange in light of lack of insurance 4. Smoking cessation 5. Please follow up on the following pending results: None  Home Health: None Equipment/Devices: None  Discharge Condition: Stable CODE STATUS: Full code Diet recommendation: Heart healthy   Brief/Interim Summary:  Admission HPI written by Hillary BowJared M Gardner, DO   Chief Complaint: SOB  HPI: Steven GoonMatthew Dudley is a 43 y.o. male with medical history significant of COPD, DM2, HTN, CHF.  Patient still smoking 2 PPD.  Patient presents to the ED with 1 week history of worsening SOB, wheezing, cough, N/V.  Symptoms not helped by his home inhaler.  Onset 1 week ago, worsening.   ED Course: COVID-19 is negative.  Found to have new O2 requirement.  Apparently they have tried to put him on home O2 in the past though he has declined to go on this.  Says he usually sats in the 80s, but today satting in the 70s which was unusual.  Given steroids, neb treatments.  Trop neg today, was also neg 20 hours ago when seen in ED yesterday for similar symptoms.   BNP 50.   Hospital course:  COPD with acute exacerbation: Started on Ceftriaxone, steroid burst and nebulizer treatments with improvement. Ambulatory oxygen requirement improved prior to discharge and patient does not qualify for home oxygen. Discharged on prednisone burst, continued Duoneb use. New prescriptions for Duoneb nebulizer solution, Albuterol HFA and Dulera. Case management consulted for medication assistance information.   Acute respiratory failure with hypoxia Secondary to above. Likely contributed to by  OSA and possibly OHS. Did well with CPAP overnight. Recommend outpatient follow-up for sleep study and CPAP machine if able. Case management consulted but unlikely anything can be done inpatient.  Morbid Obesity Body mass index is 41.2 kg/m.  Hypertension Continue home losartan  Diabetes mellitus type 2 Sliding scale while inpatient. Continue home glipizide and metformin on discharge  Hx of CHF Euvolemic. Continue home Lasix  Hyperlipidemia Continued home Lipitor  Discharge Diagnoses:  Principal Problem:   COPD with acute exacerbation (HCC) Active Problems:   Hypertension   Acute respiratory failure with hypoxia (HCC)   Diabetes mellitus type 2 in obese [facility](HCC)    Discharge Instructions  Discharge Instructions    Diet - low sodium heart healthy   Complete by:  As directed    Increase activity slowly   Complete by:  As directed      Allergies as of 02/20/2019      Reactions   Acetaminophen Other (See Comments)   GI BLEEDING Other reaction(s): Other GI BLEEDING   Shellfish Allergy Nausea And Vomiting, Nausea Only, Other (See Comments)   Headache, heat flashes. Other reaction(s): Other Headache, heat flashes.      Medication List    TAKE these medications   albuterol 108 (90 Base) MCG/ACT inhaler Commonly known as:  VENTOLIN HFA Inhale 2 puffs into the lungs every 6 (six) hours as needed for wheezing or shortness of breath.   aspirin 81 MG chewable tablet Chew 81 mg by mouth daily.   atorvastatin 20 MG tablet Commonly known as:  LIPITOR Take 20 mg by mouth daily.   docusate sodium  100 MG capsule Commonly known as:  COLACE Take 1 capsule (100 mg total) by mouth every 12 (twelve) hours.   doxycycline 50 MG capsule Commonly known as:  VIBRAMYCIN Take 1 capsule (50 mg total) by mouth 2 (two) times daily for 3 days.   famotidine 20 MG tablet Commonly known as:  PEPCID Take 1 tablet (20 mg total) by mouth 2 (two) times daily.   furosemide 20 MG  tablet Commonly known as:  LASIX Take 1 tablet (20 mg total) by mouth 2 (two) times daily. What changed:    how much to take  when to take this   glipiZIDE 5 MG 24 hr tablet Commonly known as:  GLUCOTROL XL Take 5 mg by mouth daily with breakfast.   ipratropium-albuterol 0.5-2.5 (3) MG/3ML Soln Commonly known as:  DUONEB Take 3 mLs by nebulization every 4 (four) hours as needed (wheezing and shortness of breath).   losartan 50 MG tablet Commonly known as:  COZAAR Take 50 mg by mouth daily.   metFORMIN 500 MG tablet Commonly known as:  GLUCOPHAGE Take 1 tablet (500 mg total) by mouth 2 (two) times daily with a meal. What changed:    how much to take  when to take this   mometasone-formoterol 200-5 MCG/ACT Aero Commonly known as:  DULERA Inhale 1 puff into the lungs 2 (two) times daily.   ondansetron 4 MG disintegrating tablet Commonly known as:  Zofran ODT Take 1 tablet (4 mg total) by mouth every 4 (four) hours as needed for nausea or vomiting.   predniSONE 20 MG tablet Commonly known as:  DELTASONE Take 2 tablets (40 mg total) by mouth daily with breakfast for 2 days. Start taking on:  Feb 21, 2019      Follow-up Information    Briscoe, Sharrie Rothman, MD Follow up.   Specialty:  Family Medicine Why:  Hospital follow-up Contact information: [address] [place] [address] [phone]          Allergies  Allergen Reactions  . Acetaminophen Other (See Comments)    GI BLEEDING Other reaction(s): Other GI BLEEDING  . Shellfish Allergy Nausea And Vomiting, Nausea Only and Other (See Comments)    Headache, heat flashes. Other reaction(s): Other Headache, heat flashes.    Consultations:  None   Procedures/Studies: Dg Chest 2 View  Result Date: 02/17/2019 CLINICAL DATA:  Left-sided chest pain EXAM: CHEST - 2 VIEW COMPARISON:  04/22/2018 FINDINGS: The heart size and mediastinal contours are within normal limits. Both lungs are  clear. The visualized skeletal structures are unremarkable. IMPRESSION: No acute abnormality of the lungs. Electronically Signed   By: Lauralyn Primes M.D.   On: 02/17/2019 15:28   Ct Abdomen Pelvis W Contrast  Result Date: 02/17/2019 CLINICAL DATA:  Abdominal pain and swelling EXAM: CT ABDOMEN AND PELVIS WITH CONTRAST TECHNIQUE: Multidetector CT imaging of the abdomen and pelvis was performed using the standard protocol following bolus administration of intravenous contrast. CONTRAST:  OMNIPAQUE 300 COMPARISON:  06/26/2012 FINDINGS: Lower chest: No acute abnormality. Hepatobiliary: Mild fatty infiltration of the liver is noted. Pancreas: Unremarkable. No pancreatic ductal dilatation or surrounding inflammatory changes. Spleen: Normal in size without focal abnormality. Adrenals/Urinary Tract: Adrenal glands are unremarkable. Kidneys are normal, without renal calculi, focal lesion, or hydronephrosis. Bladder is decompressed. Stomach/Bowel: The appendix has been surgically removed. No obstructive or inflammatory changes are noted. Vascular/Lymphatic: Aortic atherosclerosis. No enlarged abdominal or pelvic lymph nodes. Reproductive: Prostate is unremarkable. Other: No abdominal  wall hernia or abnormality. No abdominopelvic ascites. Musculoskeletal: No acute or significant osseous findings. IMPRESSION: No acute abnormality noted. Stable fatty liver. Electronically Signed   By: Alcide Clever M.D.   On: 02/17/2019 17:41   Dg Chest Portable 1 View  Result Date: 02/18/2019 CLINICAL DATA:  Shortness of breath EXAM: PORTABLE CHEST 1 VIEW COMPARISON:  Feb 17, 2019 FINDINGS: No edema or consolidation. Heart is upper normal in size with pulmonary vascularity normal. No adenopathy. No bone lesions. IMPRESSION: No edema or consolidation.  Stable cardiac silhouette. Electronically Signed   By: Bretta Bang III M.D.   On: 02/18/2019 17:22      Subjective: No dyspnea or wheezing  Discharge Exam: Vitals:    02/20/19 0757 02/20/19 1247  BP:  133/82  Pulse:  69  Resp:  18  Temp:  97.7 F (36.5 C)  SpO2: 96% 92%   Vitals:   02/19/19 2141 02/20/19 0612 02/20/19 0757 02/20/19 1247  BP: 135/80 114/77  133/82  Pulse: 78 76  69  Resp: Temp: 98.6 F (37 C) 97.8 F (36.6 C)  97.7 F (36.5 C)  TempSrc: Oral Oral  Oral  SpO2: 99% 93% 96% 92%  Weight:      Height:        General: Pt is alert, awake, not in acute distress Cardiovascular: RRR, S1/S2 +, no rubs, no gallops Respiratory: CTA bilaterally, no wheezing, no rhonchi Abdominal: Soft, NT, ND, bowel sounds + Extremities: no edema, no cyanosis    The results of significant diagnostics from this hospitalization (including imaging, microbiology, ancillary and laboratory) are listed below for reference.     Microbiology: Recent Results (from the past 240 hour(s))  SARS Coronavirus 2 (CEPHEID- Performed in [facility] hospital lab), Hosp Order     Status: None   Collection Time: 02/18/19  4:11 PM  Result Value Ref Range Status   SARS Coronavirus 2 NEGATIVE NEGATIVE Final    Comment: (NOTE) If result is NEGATIVE SARS-CoV-2 target nucleic acids are NOT DETECTED. The SARS-CoV-2 RNA is generally detectable in upper and lower  respiratory specimens during the acute phase of infection. The lowest  concentration of SARS-CoV-2 viral copies this assay can detect is 250  copies / mL. A negative result does not preclude SARS-CoV-2 infection  and should not be used as the sole basis for treatment or other  patient management decisions.  A negative result may occur with  improper specimen collection / handling, submission of specimen other  than nasopharyngeal swab, presence of viral mutation(s) within the  areas targeted by this assay, and inadequate number of viral copies  (<250 copies / mL). A negative result must be combined with clinical  observations, patient history, and epidemiological information. If result is POSITIVE  SARS-CoV-2 target nucleic acids are DETECTED. The SARS-CoV-2 RNA is generally detectable in upper and lower  respiratory specimens dur ing the acute phase of infection.  Positive  results are indicative of active infection with SARS-CoV-2.  Clinical  correlation with patient history and other diagnostic information is  necessary to determine patient infection status.  Positive results do  not rule out bacterial infection or co-infection with other viruses. If result is PRESUMPTIVE POSTIVE SARS-CoV-2 nucleic acids MAY BE PRESENT.   A presumptive positive result was obtained on the submitted specimen  and confirmed on repeat testing.  While 2019 novel coronavirus  (SARS-CoV-2) nucleic acids may be present in the submitted sample  additional confirmatory testing may be  necessary for epidemiological  and / or clinical management purposes  to differentiate between  SARS-CoV-2 and other Sarbecovirus currently known to infect humans.  If clinically indicated additional testing with an alternate test  methodology [phone]) is advised. The SARS-CoV-2 RNA is generally  detectable in upper and lower respiratory sp ecimens during the acute  phase of infection. The expected result is Negative. Fact Sheet for Patients:  BoilerBrush.com.cy Fact Sheet for Healthcare Providers: https://pope.com/ This test is not yet approved or cleared by the [place] FDA and has been authorized for detection and/or diagnosis of SARS-CoV-2 by FDA under an Emergency Use Authorization (EUA).  This EUA will remain in effect (meaning this test can be used) for the duration of the COVID-19 declaration under Section 564(b)(1) of the Act, 21 U.S.C. section 360bbb-3(b)(1), unless the authorization is terminated or revoked sooner. Performed at [facility], [address] [place], [place] [address]      Labs: BNP (last 3 results) Recent Labs     04/22/18 2222 04/24/18 0545 02/18/19 1609  BNP 70.8 139.1* 51.1   Basic Metabolic Panel: Recent Labs  Lab 02/17/19 1449 02/18/19 1609 02/19/19 0424  NA 138 141 135  K 3.7 3.8 5.0  CL 94* 96* 92*  CO2 33* 34* 35*  GLUCOSE 176* 133* 272*  BUN 7 6 10   CREATININE 0.74 0.76 1.00  CALCIUM 9.0 9.0 9.0   Liver Function Tests: Recent Labs  Lab 02/17/19 1449 02/18/19 1609  AST 13* 13*  ALT 21 22  ALKPHOS 71 69  BILITOT 0.8 0.7  PROT 7.6 7.2  ALBUMIN 4.1 4.0   Recent Labs  Lab 02/17/19 1449 02/18/19 1609  LIPASE 28 24   No results for input(s): AMMONIA in the last 168 hours. CBC: Recent Labs  Lab 02/17/19 1449 02/18/19 1609 02/19/19 0424  WBC 9.9 9.2 10.1  NEUTROABS  --  6.0  --   HGB 18.7* 18.2* 18.6*  HCT 56.9* 57.7* 60.0*  MCV 93.0 95.4 95.5  PLT 220 213 223   Cardiac Enzymes: Recent Labs  Lab 02/17/19 1449 02/18/19 1609  TROPONINI <0.03 <0.03   BNP: Invalid input(s): POCBNP CBG: Recent Labs  Lab 02/19/19 1134 02/19/19 1758 02/19/19 2146 02/20/19 0727 02/20/19 1122  GLUCAP 198* 252* 234* 150* 211*   D-Dimer Recent Labs    02/18/19 1609  DDIMER 0.33   Hgb A1c No results for input(s): HGBA1C in the last 72 hours. Lipid Profile No results for input(s): CHOL, HDL, LDLCALC, TRIG, CHOLHDL, LDLDIRECT in the last 72 hours. Thyroid function studies No results for input(s): TSH, T4TOTAL, T3FREE, THYROIDAB in the last 72 hours.  Invalid input(s): FREET3 Anemia work up No results for input(s): VITAMINB12, FOLATE, FERRITIN, TIBC, IRON, RETICCTPCT in the last 72 hours. Urinalysis    Component Value Date/Time   COLORURINE YELLOW 02/17/2019 1641   APPEARANCEUR CLEAR 02/17/2019 1641   LABSPEC 1.013 02/17/2019 1641   PHURINE 7.0 02/17/2019 1641   GLUCOSEU NEGATIVE 02/17/2019 1641   HGBUR NEGATIVE 02/17/2019 1641   BILIRUBINUR NEGATIVE 02/17/2019 1641   KETONESUR NEGATIVE 02/17/2019 1641   PROTEINUR NEGATIVE 02/17/2019 1641   UROBILINOGEN 0.2  08/11/2013 1315   NITRITE NEGATIVE 02/17/2019 1641   LEUKOCYTESUR NEGATIVE 02/17/2019 1641   Sepsis Labs Invalid input(s): PROCALCITONIN,  WBC,  LACTICIDVEN Microbiology Recent Results (from the past 240 hour(s))  SARS Coronavirus 2 (CEPHEID- Performed in [facility] hospital lab), Hosp Order     Status: None   Collection Time: 02/18/19  4:11  PM  Result Value Ref Range Status   SARS Coronavirus 2 NEGATIVE NEGATIVE Final    Comment: (NOTE) If result is NEGATIVE SARS-CoV-2 target nucleic acids are NOT DETECTED. The SARS-CoV-2 RNA is generally detectable in upper and lower  respiratory specimens during the acute phase of infection. The lowest  concentration of SARS-CoV-2 viral copies this assay can detect is 250  copies / mL. A negative result does not preclude SARS-CoV-2 infection  and should not be used as the sole basis for treatment or other  patient management decisions.  A negative result may occur with  improper specimen collection / handling, submission of specimen other  than nasopharyngeal swab, presence of viral mutation(s) within the  areas targeted by this assay, and inadequate number of viral copies  (<250 copies / mL). A negative result must be combined with clinical  observations, patient history, and epidemiological information. If result is POSITIVE SARS-CoV-2 target nucleic acids are DETECTED. The SARS-CoV-2 RNA is generally detectable in upper and lower  respiratory specimens dur ing the acute phase of infection.  Positive  results are indicative of active infection with SARS-CoV-2.  Clinical  correlation with patient history and other diagnostic information is  necessary to determine patient infection status.  Positive results do  not rule out bacterial infection or co-infection with other viruses. If result is PRESUMPTIVE POSTIVE SARS-CoV-2 nucleic acids MAY BE PRESENT.   A presumptive positive result was obtained on the submitted specimen  and confirmed on  repeat testing.  While 2019 novel coronavirus  (SARS-CoV-2) nucleic acids may be present in the submitted sample  additional confirmatory testing may be necessary for epidemiological  and / or clinical management purposes  to differentiate between  SARS-CoV-2 and other Sarbecovirus currently known to infect humans.  If clinically indicated additional testing with an alternate test  methodology [phone]) is advised. The SARS-CoV-2 RNA is generally  detectable in upper and lower respiratory sp ecimens during the acute  phase of infection. The expected result is Negative. Fact Sheet for Patients:  BoilerBrush.com.cy Fact Sheet for Healthcare Providers: https://pope.com/ This test is not yet approved or cleared by the [place] FDA and has been authorized for detection and/or diagnosis of SARS-CoV-2 by FDA under an Emergency Use Authorization (EUA).  This EUA will remain in effect (meaning this test can be used) for the duration of the COVID-19 declaration under Section 564(b)(1) of the Act, 21 U.S.C. section 360bbb-3(b)(1), unless the authorization is terminated or revoked sooner. Performed at [facility], [address] [place], [place] [address]      SIGNED:   Jacquelin Hawking, MD [facility] 02/20/2019, 1:31 PM

## 2019-02-20 NOTE — Progress Notes (Signed)
Patient pulled the peripheral IV around 2 am, stated that he was dreaming and accidentally pulled out. New PIV is placed in LArm.

## 2019-02-20 NOTE — Progress Notes (Signed)
Patient tolerated CPAP. No complaints or concerns voiced.

## 2019-02-20 NOTE — Discharge Instructions (Signed)

## 2019-02-20 NOTE — Progress Notes (Signed)
Inpatient Diabetes Program Recommendations  AACE/ADA: New Consensus Statement on Inpatient Glycemic Control (2015)  Target Ranges:  Prepandial:   less than 140 mg/dL      Peak postprandial:   less than 180 mg/dL (1-2 hours)      Critically ill patients:  140 - 180 mg/dL   Lab Results  Component Value Date   GLUCAP 150 (H) 02/20/2019   HGBA1C 7.8 (H) 04/23/2018    Review of Glycemic Control  Diabetes history: DM2 Outpatient Diabetes medications: metformin 1000 mg QD, glipizide 5 mg QAM Current orders for Inpatient glycemic control: Novolog 0-15 units tidwc, metformin 1000 mg QAM  Needs updated HgbA1C - last one 7.8% on 04/23/18 CBGs past 24H - 150-252 mg/dL  Inpatient Diabetes Program Recommendations:     Add HS correction Need updated HgbA1C to assess glycemic control PTA  Continue to follow.  Thank you. Ailene Ards, RD, LDN, CDE Inpatient Diabetes Coordinator [phone]

## 2019-02-20 NOTE — Progress Notes (Signed)
Pt walked 360 ft and maintained oxygen saturation above 90% on room air.

## 2019-02-20 NOTE — Progress Notes (Signed)
Discharge instructions given to pt and all questions were answered.  

## 2019-05-03 ENCOUNTER — Encounter (HOSPITAL_BASED_OUTPATIENT_CLINIC_OR_DEPARTMENT_OTHER): Payer: Self-pay | Admitting: *Deleted

## 2019-05-03 ENCOUNTER — Emergency Department (HOSPITAL_BASED_OUTPATIENT_CLINIC_OR_DEPARTMENT_OTHER)
Admission: EM | Admit: 2019-05-03 | Discharge: 2019-05-03 | Disposition: A | Discharge status: Home / Self Care | Payer: Medicaid Other | Attending: Emergency Medicine | Admitting: Emergency Medicine

## 2019-05-03 ENCOUNTER — Other Ambulatory Visit: Payer: Self-pay

## 2019-05-03 ENCOUNTER — Telehealth (HOSPITAL_BASED_OUTPATIENT_CLINIC_OR_DEPARTMENT_OTHER): Payer: Self-pay | Admitting: Emergency Medicine

## 2019-05-03 DIAGNOSIS — Y93H3 Activity, building and construction: Secondary | ICD-10-CM | POA: Insufficient documentation

## 2019-05-03 DIAGNOSIS — S39011A Strain of muscle, fascia and tendon of abdomen, initial encounter: Secondary | ICD-10-CM | POA: Insufficient documentation

## 2019-05-03 DIAGNOSIS — M722 Plantar fascial fibromatosis: Secondary | ICD-10-CM

## 2019-05-03 DIAGNOSIS — F1210 Cannabis abuse, uncomplicated: Secondary | ICD-10-CM | POA: Diagnosis not present

## 2019-05-03 DIAGNOSIS — S3991XA Unspecified injury of abdomen, initial encounter: Secondary | ICD-10-CM | POA: Diagnosis present

## 2019-05-03 DIAGNOSIS — X509XXA Other and unspecified overexertion or strenuous movements or postures, initial encounter: Secondary | ICD-10-CM | POA: Insufficient documentation

## 2019-05-03 DIAGNOSIS — E119 Type 2 diabetes mellitus without complications: Secondary | ICD-10-CM | POA: Insufficient documentation

## 2019-05-03 DIAGNOSIS — Y9289 Other specified places as the place of occurrence of the external cause: Secondary | ICD-10-CM | POA: Insufficient documentation

## 2019-05-03 DIAGNOSIS — F17210 Nicotine dependence, cigarettes, uncomplicated: Secondary | ICD-10-CM | POA: Diagnosis not present

## 2019-05-03 DIAGNOSIS — Z79899 Other long term (current) drug therapy: Secondary | ICD-10-CM | POA: Diagnosis not present

## 2019-05-03 DIAGNOSIS — I110 Hypertensive heart disease with heart failure: Secondary | ICD-10-CM | POA: Insufficient documentation

## 2019-05-03 DIAGNOSIS — J449 Chronic obstructive pulmonary disease, unspecified: Secondary | ICD-10-CM | POA: Insufficient documentation

## 2019-05-03 DIAGNOSIS — Z7984 Long term (current) use of oral hypoglycemic drugs: Secondary | ICD-10-CM | POA: Insufficient documentation

## 2019-05-03 DIAGNOSIS — Y998 Other external cause status: Secondary | ICD-10-CM | POA: Diagnosis not present

## 2019-05-03 DIAGNOSIS — M79672 Pain in left foot: Secondary | ICD-10-CM | POA: Insufficient documentation

## 2019-05-03 DIAGNOSIS — I509 Heart failure, unspecified: Secondary | ICD-10-CM | POA: Diagnosis not present

## 2019-05-03 MED ORDER — TRAMADOL HCL 50 MG PO TABS: ORAL_TABLET | ORAL | 0 refills | Status: DC

## 2019-05-03 MED ORDER — ORPHENADRINE CITRATE ER 100 MG PO TB12: 100.0000 mg | ORAL_TABLET | Freq: Two times a day (BID) | ORAL | 0 refills | Status: DC

## 2019-05-03 NOTE — ED Triage Notes (Signed)
Left heel pain for a few weeks. When he bends or stretches his ankles hurt. Abdominal pain in his lower abdomen that he feels is a hernia. States he is a diabetic.

## 2019-05-03 NOTE — ED Provider Notes (Signed)
[facility] EMERGENCY DEPARTMENT Provider Note   CSN: [id number] Arrival date & time: 05/03/19  1419    History   Chief Complaint Chief Complaint  Patient presents with  . Abdominal Pain  . Leg Pain  . Foot Pain    HPI Steven Dudley is a 43 y.o. male.     HPI 2 chief complaints. #1.  Patient is having pain in his left heel.  When he puts weight on the foot he feels like he has needles driving and around the heel.  Pain on the top of the foot.  Pain improves with wearing a soft cushioned flip-flop and nonweightbearing.  He has not had any redness or swelling.  No injury.  Feels it more when he stretches his foot upward.  No swelling or pain in the calf.  #2.  Patient reports he has had discomfort in his very lower abdomen he indicates the suprapubic area and right in his groin on both thighs.  Reports that this is much exacerbated by going from sitting to standing.  Squatting makes it worse.  Reports he thinks he probably strained muscles.  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  He has not had any pain or burning with urination.  No swelling of the scrotum.  No fevers no vomiting no diarrhea.  He reports other than the achy pain in the straining feeling he feels well.  Patient is diabetic.  He reports he does have a PCP and takes his medications regularly.  He has follow-up in a week. Past Medical History:  Diagnosis Date  . Acid reflux   . Acute respiratory failure (HCC)   . Alcohol abuse   . Anxiety   . Asthma    daily and prn inhalers  . CHF (congestive heart failure) (HCC)   . Chondromalacia of right patella 09/2014  . COPD (chronic obstructive pulmonary disease) (HCC)   . Diabetes mellitus without complication (HCC)    TYPE II  . Gastritis   . Gastritis   . History of traumatic brain injury 2006  . Hyperglycemia   . Hypertension    states under control with  med., has been on med. x 1 yr.  . Meniscal cyst 09/2014   right knee  . Morbid obesity (HCC)   . Obesity   . Renal disorder   . Sleep apnea   . Smokers' cough [facility](HCC)     Patient Active Problem List   Diagnosis Date Noted  . COPD with acute exacerbation (HCC) 02/18/2019  . Acute exacerbation of chronic obstructive pulmonary disease (COPD) (HCC) 04/22/2018  . Diabetes mellitus type 2 in obese (HCC) 04/22/2018  . Atypical pneumonia   . Diabetes (HCC) 01/11/2016  . Prediabetes 09/17/2015  . URI (upper respiratory infection) 09/14/2015  . Chest pain 09/14/2015  . Cough 09/14/2015  . Acute respiratory failure with hypoxia (HCC) 09/14/2015  . COPD exacerbation (HCC) 09/14/2015  . Tobacco use disorder 09/14/2015  . Tachycardia 09/14/2015  . Hyperglycemia 09/14/2015  . Acid reflux   . Obesity   . Hypertension   . COPD (chronic obstructive pulmonary disease) (HCC)     Past Surgical History:  Procedure Laterality Date  . APPENDECTOMY    . FOREIGN BODY REMOVAL Left 02/27/2008   hand  . INCISION AND DRAINAGE ABSCESS Left 03/01/2008   hand  . IRRIGATION AND DEBRIDEMENT ABSCESS Left 02/27/2008   hand  .  KNEE ARTHROSCOPY WITH MEDIAL MENISECTOMY Right 09/25/2014   Procedure: RIGHT KNEE SCOPE CHONDROPLASTY/MEDIAL MENISCECTOMY;  Surgeon: Loreta Aveaniel F Murphy, MD;  Location: [facility];  Service: Orthopedics;  Laterality: Right;  ANESTHESIA: GENERAL, KNEE BLOCK  . TENOSYNOVECTOMY Left 02/27/2008   flexor tenosynovectomy left hand  . TONSILLECTOMY    . TONSILLECTOMY AND ADENOIDECTOMY    . WISDOM TOOTH EXTRACTION          Home Medications    Prior to Admission medications   Medication Sig Start Date End Date Taking? Authorizing Provider  liraglutide (VICTOZA) 18 MG/3ML SOPN Inject into the skin. 04/17/19  Yes [provider]  albuterol (VENTOLIN HFA) 108 (90 Base) MCG/ACT inhaler Inhale 2 puffs into the lungs every 6 (six) hours as needed for wheezing or shortness of  breath. 02/20/19   Narda BondsNettey, Ralph A, MD  aspirin 81 MG chewable tablet Chew 81 mg by mouth daily.    [provider]  atorvastatin (LIPITOR) 20 MG tablet Take 20 mg by mouth daily.    [provider]  docusate sodium (COLACE) 100 MG capsule Take 1 capsule (100 mg total) by mouth every 12 (twelve) hours. 02/17/19   Arby BarrettePfeiffer, Deland Slocumb, MD  famotidine (PEPCID) 20 MG tablet Take 1 tablet (20 mg total) by mouth 2 (two) times daily. 02/17/19   Arby BarrettePfeiffer, Yuri Fana, MD  furosemide (LASIX) 20 MG tablet Take 1 tablet (20 mg total) by mouth 2 (two) times daily. Patient taking differently: Take 40 mg by mouth daily.  04/25/18   Richarda OverlieAbrol, Nayana, MD  glipiZIDE (GLUCOTROL XL) 5 MG 24 hr tablet Take 5 mg by mouth daily with breakfast.    [provider]  ipratropium-albuterol (DUONEB) 0.5-2.5 (3) MG/3ML SOLN Take 3 mLs by nebulization every 4 (four) hours as needed (wheezing and shortness of breath). 02/20/19   Narda BondsNettey, Ralph A, MD  losartan (COZAAR) 50 MG tablet Take 50 mg by mouth daily. 08/25/18   [provider]  metFORMIN (GLUCOPHAGE) 500 MG tablet Take 1 tablet (500 mg total) by mouth 2 (two) times daily with a meal. Patient taking differently: Take 1,000 mg by mouth daily with breakfast.  10/08/15   Henrietta HooverBernhardt, Linda C, NP  mometasone-formoterol (DULERA) 200-5 MCG/ACT AERO Inhale 1 puff into the lungs 2 (two) times daily. 02/20/19   Narda BondsNettey, Ralph A, MD  ondansetron (ZOFRAN ODT) 4 MG disintegrating tablet Take 1 tablet (4 mg total) by mouth every 4 (four) hours as needed for nausea or vomiting. 02/17/19   Arby BarrettePfeiffer, Vinicius Brockman, MD  orphenadrine (NORFLEX) 100 MG tablet Take 1 tablet (100 mg total) by mouth 2 (two) times daily. 05/03/19   Arby BarrettePfeiffer, Baylin Cabal, MD  traMADol Janean Sark(ULTRAM) 50 MG tablet 1-2 tablets every 6 hours as needed for pain 05/03/19   Arby BarrettePfeiffer, Jams Trickett, MD    Family History Family History  Problem Relation Age of Onset  . Stomach cancer Mother   . Cancer Mother     Social History  Social History   Tobacco Use  . Smoking status: Heavy Tobacco Smoker    Packs/day: 2.00    Years: 26.00    Pack years: 52.00    Types: Cigarettes  . Smokeless tobacco: Former Engineer, waterUser  Substance Use Topics  . Alcohol use: Yes    Alcohol/week: 0.0 standard drinks    Comment: Daily  . Drug use: Yes    Types: Marijuana     Allergies   Acetaminophen and Shellfish allergy   Review of Systems Review of Systems 10 Systems reviewed and  are negative for acute change except as noted in the HPI.  Physical Exam Updated Vital Signs BP 120/67   Pulse 92   Temp 98.9 F (37.2 C) (Oral)   Resp 18   Ht 5\' 7"  (1.702 m)   Wt 126.1 kg   SpO2 97%   BMI 43.54 kg/m   Physical Exam Constitutional:      Comments: Alert and nontoxic.  Clinically well in appearance.  Sitting in a chair at the bedside.  Eyes:     Extraocular Movements: Extraocular movements intact.     Conjunctiva/sclera: Conjunctivae normal.  Cardiovascular:     Rate and Rhythm: Normal rate and regular rhythm.  Pulmonary:     Effort: Pulmonary effort is normal.     Breath sounds: Normal breath sounds.  Abdominal:     Comments: Abdomen is obese but soft and nontender.  Slight discomfort over the lower abdominal wall just in the suprapubic area.  Is normal without any rashes or erythema.  Examination of the groin shows normal groin without mass, fullness or lymphadenopathy.  Scrotum examined.  Testicles nontender.  No scrotal erythema.  No findings suggestive of cellulitis or skin changes in the perineum.  No inguinal hernias present.  Musculoskeletal:     Comments: Patient does not have peripheral edema.  The calves are soft and nontender.  He has focal discomfort to palpation to the sole of the foot right at the plantar fascial insertion on the heel.  He has no tenderness over the top of the foot or other areas of the sole of the foot.  No appearance of wound swelling or redness.  Worsened by plantar flexion.  Skin:     General: Skin is warm and dry.  Neurological:     General: No focal deficit present.     Mental Status: He is oriented to person, place, and time.     Coordination: Coordination normal.  Psychiatric:        Mood and Affect: Mood normal.      ED Treatments / Results  Labs (all labs ordered are listed, but only abnormal results are displayed) Labs Reviewed - No data to display  EKG None  Radiology No results found.  Procedures Procedures (including critical care time)  Medications Ordered in ED Medications - No data to display   Initial Impression / Assessment and Plan / ED Course  I have reviewed the triage vital signs and the nursing notes.  Pertinent labs & imaging results that were available during my care of the patient were reviewed by me and considered in my medical decision making (see chart for details).       Diabetic patient but clinically well.  Foot pain is very consistent with plantar fasciitis.  It is focal to the heel at plantar insertion.  No signs of injury, infection or generalized swelling.  Improved by a cushioned sole of the shoe.  Recommend follow-up with podiatry and information given regarding plantar fasciitis management.  Patient is complaining of lower abdominal pain and groin pain is consistent with muscle strain.  It is centered over the suprapubic lower abdominal wall and then the high medial abductors.  Physical exam is normal.  It is exacerbated by squatting and bending and changing of position.  Patient describes having lifted a very heavy set of boards that required a squatting and then lifting.  He is counseled on return precautions for redness or swelling of the scrotum or any of the skin surfaces or any  evolving additional symptoms.  At this time there is no sign of inguinal hernias or necrotizing fasciitis or other genital related disorders.  For pain control of these 2 musculoskeletal problems plan will be tramadol and muscle relaxer.   Patient describes history of some mild renal insufficiency so will avoid NSAIDs at this time.  Notably last chemistry profile in May did show normal GFR but will defer use of NSAIDs to PCP pending response to current treatment.  Final Clinical Impressions(s) / ED Diagnoses   Final diagnoses:  Plantar fasciitis  Abdominal muscle strain, initial encounter    ED Discharge Orders         Ordered    traMADol (ULTRAM) 50 MG tablet     05/03/19 1545    orphenadrine (NORFLEX) 100 MG tablet  2 times daily     05/03/19 1545           Charlesetta Shanks, MD 05/03/19 1610

## 2019-05-07 ENCOUNTER — Other Ambulatory Visit: Payer: Self-pay

## 2019-05-07 ENCOUNTER — Ambulatory Visit (INDEPENDENT_AMBULATORY_CARE_PROVIDER_SITE_OTHER): Payer: Medicaid Other | Admitting: Pulmonary Disease

## 2019-05-07 ENCOUNTER — Encounter: Payer: Self-pay | Admitting: Pulmonary Disease

## 2019-05-07 VITALS — BP 130/78 | HR 95 | Temp 97.8°F | Ht 67.0 in | Wt 279.4 lb

## 2019-05-07 DIAGNOSIS — G4733 Obstructive sleep apnea (adult) (pediatric): Secondary | ICD-10-CM | POA: Diagnosis not present

## 2019-05-07 DIAGNOSIS — J449 Chronic obstructive pulmonary disease, unspecified: Secondary | ICD-10-CM | POA: Diagnosis not present

## 2019-05-07 MED ORDER — CHANTIX STARTING MONTH PAK 0.5 MG X 11 & 1 MG X 42 PO TABS: ORAL_TABLET | ORAL | 0 refills | Status: DC

## 2019-05-07 NOTE — Progress Notes (Signed)
Subjective:    Patient ID: Steven Dudley, male    DOB: May 27, 1976, 43 y.o.   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  HENT: Negative for congestion, dental problem, ear pain, nosebleeds, postnasal drip, rhinorrhea, sinus pressure, sneezing, sore throat and trouble swallowing.   Eyes: Negative for redness and itching.  Respiratory: Positive for cough and shortness of breath. Negative for chest tightness and wheezing.   Cardiovascular: Negative for palpitations and leg swelling.  Gastrointestinal: Negative for nausea and vomiting.  Genitourinary: Negative for dysuria.  Musculoskeletal: Positive for joint swelling.  Skin: Negative for rash.  Allergic/Immunologic: Negative.  Negative for environmental allergies, food allergies and immunocompromised state.  Neurological: Negative for headaches.  Hematological: Does not bruise/bleed easily.  Psychiatric/Behavioral: Negative for dysphoric mood. The patient is not nervous/anxious.    Past Medical History:  Diagnosis Date  . Acid reflux   . Acute respiratory failure ([place])   . Alcohol abuse   . Anxiety   . Asthma    daily and prn inhalers  . CHF (congestive heart failure) ([place])   . Chondromalacia of right patella 09/2014  . COPD (chronic obstructive pulmonary disease) ([place])   . Diabetes mellitus without complication ([place])     TYPE II  . Gastritis   . Gastritis   . History of traumatic brain injury 2006  . Hyperglycemia   . Hypertension    states under control with med., has been on med. x 1 yr.  . Meniscal cyst 09/2014   right knee  . Morbid obesity ([place])   . Obesity   . Renal disorder   . Sleep apnea   . Smokers' cough ([place])    Family History  Problem Relation Age of Onset  . Stomach cancer Mother   . Cancer Mother    Continues to smoke actively though willing to quit    Objective:   Physical Exam Constitutional:      Appearance: Normal appearance. He is obese.  HENT:     Mouth/Throat:     Mouth: Mucous membranes are moist.  Eyes:     General:        Right eye: No discharge.        Left eye: No discharge.  Neck:     Musculoskeletal: Normal range of motion and neck supple. No neck rigidity or muscular tenderness.  Cardiovascular:     Rate and Rhythm: Normal rate and regular rhythm.     Pulses: Normal pulses.     Heart sounds: Normal heart sounds. No murmur. No friction rub.  Pulmonary:     Effort: Pulmonary effort is normal. No respiratory distress.     Breath sounds: Normal breath sounds. No stridor. No wheezing or rhonchi.  Musculoskeletal: Normal range of motion.        General: Swelling and tenderness present. No deformity.  Skin:    General: Skin is warm.  Coloration: Skin is not jaundiced or pale.  Neurological:     General: No focal deficit present.     Mental Status: He is alert and oriented to person, place, and time.     Cranial Nerves: No cranial nerve deficit.     Sensory: No sensory deficit.  Psychiatric:        Mood and Affect: Mood normal.    Vitals:   05/07/19 0935  BP: 130/78  Pulse: 95  Temp: 97.8 F (36.6 C)  SpO2: 96%   Chest x-ray from May 2020-reviewed by myself showing no acute infiltrate      Assessment & Plan:  .  Chronic hypoxemic respiratory failure .  Chronic obstructive pulmonary disease .  Active smoker .  Obstructive sleep  apnea-untreated .  Obesity  Plan: Continue current inhalers including Spiriva and Dulera Continue nebulizer use  Obtain pulmonary function study  Obtain CT scan of the chest X assess degree of emphysema  Smoking cessation counseling  patient will be given a prescription for Chantix  .  We will see him back in the office in about 6 weeks .  He seems pretty motivated to wanting to at least quit smoking, states he had significant difficulty trying to quit in the past although was able to quit for 4 to 5 months in the past on multiple occasions  .  We will find out whether a CPAP can be obtained with his previous study .  As he has showed poor compliance in the past, difficult to assess whether he will comply with CPAP use

## 2019-05-07 NOTE — Patient Instructions (Signed)
COPD Chronic hypoxemia Obstructive lung disease  Obtain pulmonary function test Obtain CT scan of the chest to assess degree of emphysema  Continue Spiriva and Dulera Continue nebulizer use  We will look into further evaluation of your sleep apnea  We will see you back in 6 weeks  Most important decision is for you to quit smoking

## 2019-05-08 NOTE — Addendum Note (Signed)
Addended by: Elton Sin on: 05/08/2019 11:04 AM   Modules accepted: Orders

## 2019-05-08 NOTE — Addendum Note (Signed)
Addended by: Elton Sin on: 05/08/2019 01:56 PM   Modules accepted: Orders

## 2019-05-20 ENCOUNTER — Telehealth: Payer: Self-pay | Admitting: Pulmonary Disease

## 2019-05-20 NOTE — Telephone Encounter (Signed)
none

## 2019-05-21 ENCOUNTER — Encounter (HOSPITAL_BASED_OUTPATIENT_CLINIC_OR_DEPARTMENT_OTHER): Payer: Medicaid Other

## 2019-05-23 ENCOUNTER — Telehealth: Payer: Self-pay | Admitting: Pulmonary Disease

## 2019-05-23 NOTE — Telephone Encounter (Signed)
Call returned to patient, made aware he does not need covid testing prior to his CT 06/11/19. Voiced understanding. Nothing further is needed at this time.

## 2019-05-27 ENCOUNTER — Ambulatory Visit (HOSPITAL_BASED_OUTPATIENT_CLINIC_OR_DEPARTMENT_OTHER): Payer: Medicaid Other | Admitting: Pulmonary Disease

## 2019-05-28 ENCOUNTER — Telehealth: Payer: Self-pay | Admitting: Pulmonary Disease

## 2019-05-28 NOTE — Telephone Encounter (Signed)
Have attempted to call patient several times to schedule appointment with Dr. Ander Slade. Patient's CT scan was not approved so he needs an OV with AO.

## 2019-06-10 NOTE — Telephone Encounter (Signed)
Was able to get in touch with patient.  Scheduled patient for OV with Dr. Ander Slade.  Patient stated he initially didn't want sleep study done because he didn't want to have a COVID test.  Patient reports having COVID swab on 8/26 at [facility] and would now like to schedule his in lab study.    Routing message to PCCs to schedule in lab sleep study.

## 2019-06-11 ENCOUNTER — Ambulatory Visit (HOSPITAL_COMMUNITY): Payer: Medicaid Other

## 2019-06-11 ENCOUNTER — Other Ambulatory Visit: Payer: Self-pay | Admitting: Pulmonary Disease

## 2019-06-11 NOTE — Telephone Encounter (Signed)
Called pt & gave him phone # for Sleep Lab so he can reschedule Sleep Study and they can set him up for Covid test.  He states he just had test on 8/26.  I explained test has to be done within so many days of study and Sleep Study probably won't be for at least a couple of weeks.  He can call sleep lab & they will give him appt for both.  He states ok and took phone # for lab & states he will call them.  Nothing further needed.

## 2019-06-21 ENCOUNTER — Other Ambulatory Visit (HOSPITAL_COMMUNITY)
Admission: RE | Admit: 2019-06-21 | Discharge: 2019-06-21 | Disposition: A | Discharge status: Home / Self Care | Payer: Medicaid Other | Source: Ambulatory Visit | Attending: Pulmonary Disease | Admitting: Pulmonary Disease

## 2019-06-21 DIAGNOSIS — Z01812 Encounter for preprocedural laboratory examination: Secondary | ICD-10-CM | POA: Diagnosis present

## 2019-06-21 DIAGNOSIS — Z20828 Contact with and (suspected) exposure to other viral communicable diseases: Secondary | ICD-10-CM | POA: Insufficient documentation

## 2019-06-21 LAB — SARS CORONAVIRUS 2 (TAT 6-24 HRS): SARS Coronavirus 2: NEGATIVE

## 2019-06-24 ENCOUNTER — Other Ambulatory Visit: Payer: Self-pay

## 2019-06-24 ENCOUNTER — Ambulatory Visit (INDEPENDENT_AMBULATORY_CARE_PROVIDER_SITE_OTHER): Payer: Medicaid Other | Admitting: Pulmonary Disease

## 2019-06-24 ENCOUNTER — Encounter: Payer: Self-pay | Admitting: Pulmonary Disease

## 2019-06-24 VITALS — BP 146/74 | HR 100 | Temp 97.8°F | Ht 67.0 in | Wt 280.0 lb

## 2019-06-24 DIAGNOSIS — G4733 Obstructive sleep apnea (adult) (pediatric): Secondary | ICD-10-CM

## 2019-06-24 DIAGNOSIS — J449 Chronic obstructive pulmonary disease, unspecified: Secondary | ICD-10-CM

## 2019-06-24 LAB — PULMONARY FUNCTION TEST
DL/VA % pred: 100 %
DL/VA: 4.67 ml/min/mmHg/L
DLCO unc % pred: 88 %
DLCO unc: 24.8 ml/min/mmHg
FEF 25-75 Post: 1.21 L/sec
FEF 25-75 Pre: 1.37 L/sec
FEF2575-%Change-Post: -11 %
FEF2575-%Pred-Post: 33 %
FEF2575-%Pred-Pre: 38 %
FEV1-%Change-Post: -3 %
FEV1-%Pred-Post: 57 %
FEV1-%Pred-Pre: 59 %
FEV1-Post: 2.16 L
FEV1-Pre: 2.24 L
FEV1FVC-%Change-Post: 2 %
FEV1FVC-%Pred-Pre: 86 %
FEV6-%Change-Post: -5 %
FEV6-%Pred-Post: 66 %
FEV6-%Pred-Pre: 70 %
FEV6-Post: 3.07 L
FEV6-Pre: 3.24 L
FEV6FVC-%Change-Post: 0 %
FEV6FVC-%Pred-Post: 102 %
FEV6FVC-%Pred-Pre: 102 %
FVC-%Change-Post: -5 %
FVC-%Pred-Post: 64 %
FVC-%Pred-Pre: 68 %
FVC-Post: 3.07 L
FVC-Pre: 3.26 L
Post FEV1/FVC ratio: 70 %
Post FEV6/FVC ratio: 100 %
Pre FEV1/FVC ratio: 69 %
Pre FEV6/FVC Ratio: 100 %
RV % pred: 138 %
RV: 2.36 L
TLC % pred: 88 %
TLC: 5.58 L

## 2019-06-24 NOTE — Progress Notes (Signed)
Full PFT performed today. °

## 2019-06-24 NOTE — Patient Instructions (Signed)
COPD-stable Continue to work on quitting smoking Continue breathing treatments  I will see you back in the office in about 6 months Call with significant concerns

## 2019-06-24 NOTE — Progress Notes (Signed)
Subjective:    Patient ID: Steven Dudley, male    DOB: [birth year], 43 y.o.   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  HENT: Negative for congestion, dental problem, ear pain, nosebleeds, postnasal drip, rhinorrhea, sinus pressure, sneezing, sore throat and trouble swallowing.   Eyes: Negative for redness and itching.  Respiratory: Positive for cough and shortness of breath. Negative for chest tightness and wheezing.   Cardiovascular: Negative for palpitations and leg swelling.  Gastrointestinal: Negative for nausea and vomiting.  Genitourinary: Negative for dysuria.  Musculoskeletal: Positive for joint swelling.  Skin: Negative for rash.  Allergic/Immunologic: Negative.  Negative for environmental allergies, food allergies and immunocompromised state.  Neurological: Negative for headaches.  Hematological: Does not bruise/bleed easily.  Psychiatric/Behavioral: Negative for dysphoric mood. The patient is not nervous/anxious.    Past Medical History:  Diagnosis Date  . Acid reflux   . Acute respiratory failure ([place])   . Alcohol abuse   . Anxiety   . Asthma    daily and prn inhalers  . CHF (congestive heart failure) ([place])   . Chondromalacia of right patella 09/2014  . COPD  (chronic obstructive pulmonary disease) ([place])   . Diabetes mellitus without complication ([place])    TYPE II  . Gastritis   . Gastritis   . History of traumatic brain injury 2006  . Hyperglycemia   . Hypertension    states under control with med., has been on med. x 1 yr.  . Meniscal cyst 09/2014   right knee  . Morbid obesity ([place])   . Obesity   . Renal disorder   . Sleep apnea   . Smokers' cough ([place])    Family History  Problem Relation Age of Onset  . Stomach cancer Mother   . Cancer Mother    Continues to smoke actively though willing to quit -down to half a pack of cigarettes a day    Objective:   Physical Exam Constitutional:      Appearance: Normal appearance. He is obese.  Neck:     Musculoskeletal: Normal range of motion and neck supple. No neck rigidity or muscular tenderness.  Cardiovascular:     Rate and Rhythm: Normal rate and regular rhythm.     Pulses: Normal pulses.     Heart sounds: Normal heart sounds. No murmur. No friction rub.  Pulmonary:     Effort: Pulmonary effort is normal. No respiratory distress.     Breath sounds: Normal breath sounds. No stridor. No wheezing or rhonchi.  Musculoskeletal: Normal range of motion.        General: Swelling and tenderness present. No deformity.  Skin:    General: Skin is warm.     Coloration: Skin is not jaundiced or pale.  Neurological:  General: No focal deficit present.     Mental Status: He is alert and oriented to person, place, and time.     Cranial Nerves: No cranial nerve deficit.     Sensory: No sensory deficit.    Vitals:   06/24/19 1506  BP: (!) 146/74  Pulse: 100  Temp: 97.8 F (36.6 C)  SpO2: 94%   Chest x-ray from May 2020-reviewed by myself showing no acute infiltrate PFT shows moderate obstructive disease     Assessment & Plan:  .  Chronic hypoxemic respiratory failure .  Chronic obstructive pulmonary disease -Moderate obstruction on PFT   .  Active smoker -Cut down from 3  packs a day to half a pack a day -Continues on Chantix and nicotine patch   .  Obstructive sleep apnea-untreated -Does not want to have any further evaluation at present -He feels he will not be able to get used to using CPAP  .  Obesity -Continues to stay active -Trying to watch his diet  Plan: Continue current inhalers including Spiriva and Dulera Continue nebulizer use  Encouraged to continue working on smoking cessation Smoking cessation counseling  Continue Chantix Continue nicotine patch  .  We will see him back in the office in 3 months  .  Continues to be motivated to quit  .  He does not want further evaluation for sleep disordered breathing at present-aware of risks

## 2019-06-25 ENCOUNTER — Ambulatory Visit: Payer: Medicaid Other | Admitting: Pulmonary Disease

## 2019-11-29 ENCOUNTER — Other Ambulatory Visit: Payer: Self-pay

## 2019-11-29 ENCOUNTER — Observation Stay (HOSPITAL_BASED_OUTPATIENT_CLINIC_OR_DEPARTMENT_OTHER)
Admission: EM | Admit: 2019-11-29 | Discharge: 2019-11-30 | Disposition: A | Discharge status: Home / Self Care | Payer: Medicaid Other | Attending: Internal Medicine | Admitting: Internal Medicine

## 2019-11-29 ENCOUNTER — Emergency Department (HOSPITAL_BASED_OUTPATIENT_CLINIC_OR_DEPARTMENT_OTHER): Payer: Medicaid Other

## 2019-11-29 ENCOUNTER — Encounter (HOSPITAL_BASED_OUTPATIENT_CLINIC_OR_DEPARTMENT_OTHER): Payer: Self-pay | Admitting: Emergency Medicine

## 2019-11-29 DIAGNOSIS — E669 Obesity, unspecified: Secondary | ICD-10-CM

## 2019-11-29 DIAGNOSIS — I110 Hypertensive heart disease with heart failure: Secondary | ICD-10-CM | POA: Insufficient documentation

## 2019-11-29 DIAGNOSIS — Z20822 Contact with and (suspected) exposure to covid-19: Secondary | ICD-10-CM | POA: Diagnosis not present

## 2019-11-29 DIAGNOSIS — I10 Essential (primary) hypertension: Secondary | ICD-10-CM | POA: Diagnosis not present

## 2019-11-29 DIAGNOSIS — F17210 Nicotine dependence, cigarettes, uncomplicated: Secondary | ICD-10-CM | POA: Insufficient documentation

## 2019-11-29 DIAGNOSIS — Z7982 Long term (current) use of aspirin: Secondary | ICD-10-CM | POA: Diagnosis not present

## 2019-11-29 DIAGNOSIS — J9622 Acute and chronic respiratory failure with hypercapnia: Secondary | ICD-10-CM

## 2019-11-29 DIAGNOSIS — Z7951 Long term (current) use of inhaled steroids: Secondary | ICD-10-CM | POA: Diagnosis not present

## 2019-11-29 DIAGNOSIS — J441 Chronic obstructive pulmonary disease with (acute) exacerbation: Secondary | ICD-10-CM

## 2019-11-29 DIAGNOSIS — E1165 Type 2 diabetes mellitus with hyperglycemia: Secondary | ICD-10-CM | POA: Diagnosis not present

## 2019-11-29 DIAGNOSIS — I509 Heart failure, unspecified: Secondary | ICD-10-CM | POA: Insufficient documentation

## 2019-11-29 DIAGNOSIS — J9601 Acute respiratory failure with hypoxia: Secondary | ICD-10-CM | POA: Insufficient documentation

## 2019-11-29 DIAGNOSIS — K219 Gastro-esophageal reflux disease without esophagitis: Secondary | ICD-10-CM | POA: Diagnosis not present

## 2019-11-29 DIAGNOSIS — Z7984 Long term (current) use of oral hypoglycemic drugs: Secondary | ICD-10-CM | POA: Diagnosis not present

## 2019-11-29 DIAGNOSIS — J9602 Acute respiratory failure with hypercapnia: Principal | ICD-10-CM

## 2019-11-29 DIAGNOSIS — E785 Hyperlipidemia, unspecified: Secondary | ICD-10-CM | POA: Diagnosis not present

## 2019-11-29 DIAGNOSIS — J449 Chronic obstructive pulmonary disease, unspecified: Secondary | ICD-10-CM

## 2019-11-29 DIAGNOSIS — R031 Nonspecific low blood-pressure reading: Secondary | ICD-10-CM | POA: Diagnosis not present

## 2019-11-29 DIAGNOSIS — G4733 Obstructive sleep apnea (adult) (pediatric): Secondary | ICD-10-CM | POA: Insufficient documentation

## 2019-11-29 DIAGNOSIS — Z6841 Body Mass Index (BMI) 40.0 and over, adult: Secondary | ICD-10-CM | POA: Insufficient documentation

## 2019-11-29 DIAGNOSIS — T401X1A Poisoning by heroin, accidental (unintentional), initial encounter: Secondary | ICD-10-CM

## 2019-11-29 DIAGNOSIS — E1169 Type 2 diabetes mellitus with other specified complication: Secondary | ICD-10-CM | POA: Diagnosis not present

## 2019-11-29 DIAGNOSIS — Z79899 Other long term (current) drug therapy: Secondary | ICD-10-CM | POA: Insufficient documentation

## 2019-11-29 DIAGNOSIS — R739 Hyperglycemia, unspecified: Secondary | ICD-10-CM

## 2019-11-29 LAB — CBC WITH DIFFERENTIAL/PLATELET
Abs Immature Granulocytes: 1.16 10*3/uL — ABNORMAL HIGH (ref 0.00–0.07)
Basophils Absolute: 0.1 10*3/uL (ref 0.0–0.1)
Basophils Relative: 1 %
Eosinophils Absolute: 0.1 10*3/uL (ref 0.0–0.5)
Eosinophils Relative: 0 %
HCT: 51.4 % (ref 39.0–52.0)
Hemoglobin: 16 g/dL (ref 13.0–17.0)
Immature Granulocytes: 8 %
Lymphocytes Relative: 11 %
Lymphs Abs: 1.6 10*3/uL (ref 0.7–4.0)
MCH: 29.1 pg (ref 26.0–34.0)
MCHC: 31.1 g/dL (ref 30.0–36.0)
MCV: 93.5 fL (ref 80.0–100.0)
Monocytes Absolute: 0.3 10*3/uL (ref 0.1–1.0)
Monocytes Relative: 2 %
Neutro Abs: 11.3 10*3/uL — ABNORMAL HIGH (ref 1.7–7.7)
Neutrophils Relative %: 78 %
Platelets: 273 10*3/uL (ref 150–400)
RBC: 5.5 MIL/uL (ref 4.22–5.81)
RDW: 12.7 % (ref 11.5–15.5)
WBC: 14.8 10*3/uL — ABNORMAL HIGH (ref 4.0–10.5)
nRBC: 0 % (ref 0.0–0.2)

## 2019-11-29 LAB — URINALYSIS, MICROSCOPIC (REFLEX)

## 2019-11-29 LAB — COMPREHENSIVE METABOLIC PANEL
ALT: 195 U/L — ABNORMAL HIGH (ref 0–44)
AST: 241 U/L — ABNORMAL HIGH (ref 15–41)
Albumin: 3.7 g/dL (ref 3.5–5.0)
Alkaline Phosphatase: 88 U/L (ref 38–126)
Anion gap: 16 — ABNORMAL HIGH (ref 5–15)
BUN: 15 mg/dL (ref 6–20)
CO2: 27 mmol/L (ref 22–32)
Calcium: 8.5 mg/dL — ABNORMAL LOW (ref 8.9–10.3)
Chloride: 93 mmol/L — ABNORMAL LOW (ref 98–111)
Creatinine, Ser: 1.82 mg/dL — ABNORMAL HIGH (ref 0.61–1.24)
GFR calc Af Amer: 52 mL/min — ABNORMAL LOW (ref >60)
GFR calc non Af Amer: 45 mL/min — ABNORMAL LOW (ref >60)
Glucose, Bld: 473 mg/dL — ABNORMAL HIGH (ref 70–99)
Potassium: 5 mmol/L (ref 3.5–5.1)
Sodium: 136 mmol/L (ref 135–145)
Total Bilirubin: 0.4 mg/dL (ref 0.3–1.2)
Total Protein: 6.8 g/dL (ref 6.5–8.1)

## 2019-11-29 LAB — RESPIRATORY PANEL BY RT PCR (FLU A&B, COVID)
Influenza A by PCR: NEGATIVE
Influenza B by PCR: NEGATIVE
SARS Coronavirus 2 by RT PCR: NEGATIVE

## 2019-11-29 LAB — RAPID URINE DRUG SCREEN, HOSP PERFORMED
Amphetamines: NOT DETECTED
Barbiturates: NOT DETECTED
Benzodiazepines: NOT DETECTED
Cocaine: NOT DETECTED
Opiates: POSITIVE — AB
Tetrahydrocannabinol: POSITIVE — AB

## 2019-11-29 LAB — URINALYSIS, ROUTINE W REFLEX MICROSCOPIC
Bilirubin Urine: NEGATIVE
Glucose, UA: >=500 mg/dL — AB
Ketones, ur: NEGATIVE mg/dL
Leukocytes,Ua: NEGATIVE
Nitrite: NEGATIVE
Protein, ur: 100 mg/dL — AB
Specific Gravity, Urine: >1.03 — ABNORMAL HIGH (ref 1.005–1.030)
pH: 6 (ref 5.0–8.0)

## 2019-11-29 LAB — BASIC METABOLIC PANEL
Anion gap: 9 (ref 5–15)
BUN: 16 mg/dL (ref 6–20)
CO2: 33 mmol/L — ABNORMAL HIGH (ref 22–32)
Calcium: 8.2 mg/dL — ABNORMAL LOW (ref 8.9–10.3)
Chloride: 100 mmol/L (ref 98–111)
Creatinine, Ser: 1.46 mg/dL — ABNORMAL HIGH (ref 0.61–1.24)
GFR calc Af Amer: >60 mL/min (ref >60)
GFR calc non Af Amer: 58 mL/min — ABNORMAL LOW (ref >60)
Glucose, Bld: 198 mg/dL — ABNORMAL HIGH (ref 70–99)
Potassium: 4.1 mmol/L (ref 3.5–5.1)
Sodium: 142 mmol/L (ref 135–145)

## 2019-11-29 LAB — POCT I-STAT EG7
Acid-Base Excess: 1 mmol/L (ref 0.0–2.0)
Acid-base deficit: 4 mmol/L — ABNORMAL HIGH (ref 0.0–2.0)
Bicarbonate: 28.6 mmol/L — ABNORMAL HIGH (ref 20.0–28.0)
Bicarbonate: 32.7 mmol/L — ABNORMAL HIGH (ref 20.0–28.0)
Calcium, Ion: 1.14 mmol/L — ABNORMAL LOW (ref 1.15–1.40)
Calcium, Ion: 1.18 mmol/L (ref 1.15–1.40)
HCT: 49 % (ref 39.0–52.0)
HCT: 46 % (ref 39.0–52.0)
Hemoglobin: 16.7 g/dL (ref 13.0–17.0)
Hemoglobin: 15.6 g/dL (ref 13.0–17.0)
O2 Saturation: 40 %
O2 Saturation: 40 %
Patient temperature: 98.3
Potassium: 5 mmol/L (ref 3.5–5.1)
Potassium: 3.9 mmol/L (ref 3.5–5.1)
Sodium: 134 mmol/L — ABNORMAL LOW (ref 135–145)
Sodium: 140 mmol/L (ref 135–145)
TCO2: 31 mmol/L (ref 22–32)
TCO2: 35 mmol/L — ABNORMAL HIGH (ref 22–32)
pCO2, Ven: 88.6 mmHg (ref 44.0–60.0)
pCO2, Ven: 86.5 mmHg (ref 44.0–60.0)
pH, Ven: 7.116 — CL (ref 7.250–7.430)
pH, Ven: 7.185 — CL (ref 7.250–7.430)
pO2, Ven: 31 mmHg — CL (ref 32.0–45.0)
pO2, Ven: 30 mmHg — CL (ref 32.0–45.0)

## 2019-11-29 LAB — MRSA PCR SCREENING: MRSA by PCR: NEGATIVE

## 2019-11-29 LAB — SARS CORONAVIRUS 2 AG (30 MIN TAT): SARS Coronavirus 2 Ag: NEGATIVE

## 2019-11-29 LAB — GLUCOSE, CAPILLARY
Glucose-Capillary: 130 mg/dL — ABNORMAL HIGH (ref 70–99)
Glucose-Capillary: 187 mg/dL — ABNORMAL HIGH (ref 70–99)
Glucose-Capillary: 141 mg/dL — ABNORMAL HIGH (ref 70–99)

## 2019-11-29 LAB — CBG MONITORING, ED
Glucose-Capillary: 448 mg/dL — ABNORMAL HIGH (ref 70–99)
Glucose-Capillary: 350 mg/dL — ABNORMAL HIGH (ref 70–99)
Glucose-Capillary: 263 mg/dL — ABNORMAL HIGH (ref 70–99)

## 2019-11-29 LAB — HEMOGLOBIN A1C
Hgb A1c MFr Bld: 8.8 % — ABNORMAL HIGH (ref 4.8–5.6)
Mean Plasma Glucose: 205.86 mg/dL

## 2019-11-29 LAB — ETHANOL: Alcohol, Ethyl (B): <10 mg/dL (ref <10)

## 2019-11-29 MED ORDER — NALOXONE HCL 0.4 MG/ML IJ SOLN: 0.4000 mg | INTRAMUSCULAR | Status: DC | PRN

## 2019-11-29 MED ORDER — SODIUM CHLORIDE 0.9 % IV SOLN
1000.0000 mL | INTRAVENOUS | Status: DC
  Administered 2019-11-29: 1000 mL via INTRAVENOUS

## 2019-11-29 MED ORDER — INSULIN REGULAR(HUMAN) IN NACL 100-0.9 UT/100ML-% IV SOLN
INTRAVENOUS | Status: DC
  Administered 2019-11-29: 9.5 [IU]/h via INTRAVENOUS
  Filled 2019-11-29: qty 100

## 2019-11-29 MED ORDER — CHLORHEXIDINE GLUCONATE CLOTH 2 % EX PADS
6.0000 | MEDICATED_PAD | Freq: Every day | CUTANEOUS | Status: DC
  Administered 2019-11-29: 13:00:00 6 via TOPICAL

## 2019-11-29 MED ORDER — MOMETASONE FURO-FORMOTEROL FUM 200-5 MCG/ACT IN AERO
1.0000 | INHALATION_SPRAY | Freq: Two times a day (BID) | RESPIRATORY_TRACT | Status: DC
  Administered 2019-11-29 – 2019-11-30 (×2): 1 via RESPIRATORY_TRACT
  Filled 2019-11-29: qty 8.8

## 2019-11-29 MED ORDER — TIOTROPIUM BROMIDE MONOHYDRATE 18 MCG IN CAPS: 18.0000 ug | ORAL_CAPSULE | Freq: Every day | RESPIRATORY_TRACT | Status: DC

## 2019-11-29 MED ORDER — UMECLIDINIUM BROMIDE 62.5 MCG/INH IN AEPB
1.0000 | INHALATION_SPRAY | Freq: Every day | RESPIRATORY_TRACT | Status: DC
  Administered 2019-11-30: 1 via RESPIRATORY_TRACT
  Filled 2019-11-29: qty 7

## 2019-11-29 MED ORDER — INSULIN ASPART 100 UNIT/ML ~~LOC~~ SOLN
0.0000 [IU] | Freq: Three times a day (TID) | SUBCUTANEOUS | Status: DC
  Administered 2019-11-29 – 2019-11-30 (×2): 3 [IU] via SUBCUTANEOUS

## 2019-11-29 MED ORDER — ASPIRIN 81 MG PO CHEW
81.0000 mg | CHEWABLE_TABLET | Freq: Every day | ORAL | Status: DC
  Administered 2019-11-29: 81 mg via ORAL
  Filled 2019-11-29: qty 1

## 2019-11-29 MED ORDER — SODIUM CHLORIDE 0.9 % IV BOLUS
2000.0000 mL | Freq: Once | INTRAVENOUS | Status: AC
  Administered 2019-11-29: 2000 mL via INTRAVENOUS

## 2019-11-29 MED ORDER — ONDANSETRON HCL 4 MG PO TABS: 4.0000 mg | ORAL_TABLET | Freq: Four times a day (QID) | ORAL | Status: DC | PRN

## 2019-11-29 MED ORDER — NICOTINE 21 MG/24HR TD PT24
21.0000 mg | MEDICATED_PATCH | Freq: Every day | TRANSDERMAL | Status: DC
  Administered 2019-11-29: 21 mg via TRANSDERMAL
  Filled 2019-11-29: qty 1

## 2019-11-29 MED ORDER — SODIUM CHLORIDE 0.9 % IV BOLUS (SEPSIS)
1000.0000 mL | Freq: Once | INTRAVENOUS | Status: AC
  Administered 2019-11-29: 1000 mL via INTRAVENOUS

## 2019-11-29 MED ORDER — NALOXONE HCL 0.4 MG/ML IJ SOLN
0.2000 mg | Freq: Once | INTRAMUSCULAR | Status: AC
  Administered 2019-11-29: 0.2 mg via INTRAVENOUS
  Filled 2019-11-29: qty 1

## 2019-11-29 MED ORDER — ORAL CARE MOUTH RINSE
15.0000 mL | Freq: Two times a day (BID) | OROMUCOSAL | Status: DC
  Administered 2019-11-29: 15 mL via OROMUCOSAL

## 2019-11-29 MED ORDER — ONDANSETRON HCL 4 MG/2ML IJ SOLN: 4.0000 mg | Freq: Four times a day (QID) | INTRAMUSCULAR | Status: DC | PRN

## 2019-11-29 MED ORDER — DEXTROSE-NACL 5-0.45 % IV SOLN: INTRAVENOUS | Status: DC

## 2019-11-29 MED ORDER — ATORVASTATIN CALCIUM 10 MG PO TABS
20.0000 mg | ORAL_TABLET | Freq: Every day | ORAL | Status: DC
  Administered 2019-11-29: 20 mg via ORAL
  Filled 2019-11-29: qty 2

## 2019-11-29 MED ORDER — SODIUM CHLORIDE 0.9 % IV SOLN: Freq: Once | INTRAVENOUS | Status: AC

## 2019-11-29 MED ORDER — SODIUM CHLORIDE 0.9 % IV SOLN: INTRAVENOUS | Status: DC

## 2019-11-29 MED ORDER — IPRATROPIUM-ALBUTEROL 0.5-2.5 (3) MG/3ML IN SOLN: 3.0000 mL | RESPIRATORY_TRACT | Status: DC | PRN

## 2019-11-29 MED ORDER — ENOXAPARIN SODIUM 60 MG/0.6ML ~~LOC~~ SOLN
60.0000 mg | SUBCUTANEOUS | Status: DC
  Administered 2019-11-29: 21:00:00 60 mg via SUBCUTANEOUS
  Filled 2019-11-29: qty 0.6

## 2019-11-29 MED ORDER — DEXTROSE 50 % IV SOLN: 0.0000 mL | INTRAVENOUS | Status: DC | PRN

## 2019-11-29 MED ORDER — FAMOTIDINE 20 MG PO TABS
20.0000 mg | ORAL_TABLET | Freq: Two times a day (BID) | ORAL | Status: DC
  Administered 2019-11-29 (×2): 20 mg via ORAL
  Filled 2019-11-29 (×2): qty 1

## 2019-11-29 NOTE — ED Provider Notes (Signed)
[facility] DEPT Provider Note: Georgena Spurling, MD, FACEP  CSN: [id number] MRN: [id number] ARRIVAL: 11/29/19 at [place]: [place]  Drug Overdose   HISTORY OF PRESENT ILLNESS  11/29/19 6:10 AM Anita Mcadory is a 44 y.o. male with a history of diabetes, alcoholism and heroin abuse.  He was found unresponsive by his family this morning and EMS was called.  Paramedics found him to be unresponsive with pinpoint pupils and agonal respirations.  They assisted respirations and gave him 0.5 mg of Narcan IV with immediate improvement in his respiration and mental status.  He was initially combative but is now calm and cooperative.  His family states he was drinking alcohol before going to bed and paramedics found alcoholic beverages in his room.  He also admits to using heroin this morning.  His blood sugar prior to arrival was over 400.  He is complaining of feeling cold and hurting all over.  He rates his pain as a 7 out of 10.    Past Medical History:  Diagnosis Date  . Acid reflux   . Acute respiratory failure ([place])   . Alcohol abuse   . Anxiety   . Asthma    daily and prn inhalers  . CHF (congestive heart failure) ([place])   . Chondromalacia of right patella 09/2014  . COPD (chronic obstructive pulmonary disease) ([place])   . Diabetes mellitus without complication ([place])    TYPE II  . Gastritis   . History of traumatic brain injury 2006  . Hyperglycemia   . Hypertension    states under control with med., has been on med. x 1 yr.  . Meniscal cyst 09/2014   right knee  . Morbid obesity ([place])   . Obesity   . Renal disorder   . Sleep apnea   . Smokers' cough [facility])     Past Surgical History:  Procedure Laterality Date  . APPENDECTOMY    . FOREIGN BODY REMOVAL Left 02/27/2008   hand  . INCISION AND DRAINAGE ABSCESS Left 03/01/2008   hand  . IRRIGATION AND DEBRIDEMENT ABSCESS Left 02/27/2008   hand  . KNEE ARTHROSCOPY WITH MEDIAL MENISECTOMY Right 09/25/2014     Procedure: RIGHT KNEE SCOPE CHONDROPLASTY/MEDIAL MENISCECTOMY;  Surgeon: Ninetta Lights, MD;  Location: [facility];  Service: Orthopedics;  Laterality: Right;  ANESTHESIA: GENERAL, KNEE BLOCK  . TENOSYNOVECTOMY Left 02/27/2008   flexor tenosynovectomy left hand  . TONSILLECTOMY    . TONSILLECTOMY AND ADENOIDECTOMY    . WISDOM TOOTH EXTRACTION      Family History  Problem Relation Age of Onset  . Stomach cancer Mother   . Cancer Mother     Social History   Tobacco Use  . Smoking status: Heavy Tobacco Smoker    Packs/day: 1.00    Years: 26.00    Pack years: 26.00    Types: Cigarettes  . Smokeless tobacco: Former Network engineer Use Topics  . Alcohol use: Yes    Alcohol/week: 0.0 standard drinks    Comment: Daily  . Drug use: Yes    Types: Marijuana, IV    Prior to Admission medications   Medication Sig Start Date End Date Taking? Authorizing Provider  albuterol (PROVENTIL) (2.5 MG/3ML) 0.083% nebulizer solution Take 2.5 mg by nebulization every 4 (four) hours as needed for wheezing or shortness of breath. 11/26/19  Yes [provider]  albuterol (VENTOLIN HFA) 108 (90 Base) MCG/ACT inhaler Inhale 2 puffs into the lungs every  6 (six) hours as needed for wheezing or shortness of breath. 02/20/19  Yes Mariel Aloe, MD  amLODipine (NORVASC) 5 MG tablet Take 5 mg by mouth at bedtime. 11/25/19  Yes [provider]  aspirin 81 MG chewable tablet Chew 81 mg by mouth daily.   Yes [provider]  atorvastatin (LIPITOR) 20 MG tablet Take 20 mg by mouth daily.   Yes [provider]  famotidine (PEPCID) 20 MG tablet Take 1 tablet (20 mg total) by mouth 2 (two) times daily. 02/17/19  Yes Charlesetta Shanks, MD  furosemide (LASIX) 20 MG tablet Take 1 tablet (20 mg total) by mouth 2 (two) times daily. Patient taking differently: Take 40 mg by mouth daily.  04/25/18  Yes Reyne Dumas, MD  liraglutide (VICTOZA) 18 MG/3ML SOPN Inject 1.2 mg into  the skin daily.  04/17/19  Yes [provider]  losartan (COZAAR) 100 MG tablet Take 100 mg by mouth daily. 10/08/19  Yes [provider]  metFORMIN (GLUCOPHAGE-XR) 500 MG 24 hr tablet Take 2,000 mg by mouth every morning. 10/02/19  Yes [provider]  mometasone-formoterol (DULERA) 200-5 MCG/ACT AERO Inhale 1 puff into the lungs 2 (two) times daily. 02/20/19  Yes Mariel Aloe, MD  omeprazole (PRILOSEC) 20 MG capsule Take 20 mg by mouth daily. 10/02/19  Yes [provider]  sildenafil (REVATIO) 20 MG tablet Take 40-100 mg by mouth as needed for erectile dysfunction. 11/01/19  Yes [provider]  ipratropium-albuterol (DUONEB) 0.5-2.5 (3) MG/3ML SOLN Take 3 mLs by nebulization every 4 (four) hours as needed (wheezing and shortness of breath). Patient not taking: Reported on 11/29/2019 02/20/19   Mariel Aloe, MD  varenicline (CHANTIX STARTING MONTH PAK) 0.5 MG X 11 & 1 MG X 42 tablet Take 1 0.5 mg tablet once daily for 3 days, increase to 1 0.5 mg tablet twice daily for 4 days, increase to 1 1 mg tablet twice daily. Patient not taking: Reported on 11/29/2019 05/07/19   Laurin Coder, MD    Allergies Acetaminophen and Shellfish allergy   REVIEW OF SYSTEMS  Negative except as noted here or in the History of Present Illness.   PHYSICAL EXAMINATION  Initial Vital Signs Blood pressure 125/78, pulse 98, temperature 98.4 F (36.9 C), temperature source Oral, resp. rate 20, height _0  (1.702 m), weight 122.5 kg, SpO2 91 %.  Examination General: Well-developed, obese male in no acute distress; appearance consistent with age of record HENT: normocephalic; atraumatic Eyes: pupils equal, round and reactive to light; extraocular muscles intact Neck: supple Heart: regular rate and rhythm Lungs: clear to auscultation bilaterally Abdomen: soft; nondistended; nontender; bowel sounds present Extremities: No deformity; full range of motion; pulses  normal Neurologic: Awake, alert; motor function intact in all extremities and symmetric; no facial droop Skin: Cool and dry; mottled; piloerection Psychiatric: Flat affect   RESULTS  Summary of this visit's results, reviewed and interpreted by myself:   EKG Interpretation  Date/Time:    Ventricular Rate:    PR Interval:    QRS Duration:   QT Interval:    QTC Calculation:   R Axis:     Text Interpretation:        Laboratory Studies: Results for orders placed or performed during the hospital encounter of 11/29/19 (from the past 24 hour(s))  CBC with Differential (PNL)     Status: Abnormal   Collection Time: 11/29/19  6:19 AM  Result Value Ref Range   WBC 14.8 (H)  4.0 - 10.5 K/uL   RBC 5.50 4.22 - 5.81 MIL/uL   Hemoglobin 16.0 13.0 - 17.0 g/dL   HCT 51.4 39.0 - 52.0 %   MCV 93.5 80.0 - 100.0 fL   MCH 29.1 26.0 - 34.0 pg   MCHC 31.1 30.0 - 36.0 g/dL   RDW 12.7 11.5 - 15.5 %   Platelets 273 150 - 400 K/uL   nRBC 0.0 0.0 - 0.2 %   Neutrophils Relative % 78 %   Neutro Abs 11.3 (H) 1.7 - 7.7 K/uL   Lymphocytes Relative 11 %   Lymphs Abs 1.6 0.7 - 4.0 K/uL   Monocytes Relative 2 %   Monocytes Absolute 0.3 0.1 - 1.0 K/uL   Eosinophils Relative 0 %   Eosinophils Absolute 0.1 0.0 - 0.5 K/uL   Basophils Relative 1 %   Basophils Absolute 0.1 0.0 - 0.1 K/uL   WBC Morphology MILD LEFT SHIFT (1-5% METAS, OCC MYELO, OCC BANDS)    Smear Review PLATELET COUNT CONFIRMED BY SMEAR    Immature Granulocytes 8 %   Abs Immature Granulocytes 1.16 (H) 0.00 - 0.07 K/uL   Polychromasia PRESENT    Spherocytes PRESENT    Stomatocytes PRESENT    Giant PLTs PRESENT   CBG monitoring, ED     Status: Abnormal   Collection Time: 11/29/19  6:27 AM  Result Value Ref Range   Glucose-Capillary 448 (H) 70 - 99 mg/dL  Comprehensive metabolic panel     Status: Abnormal   Collection Time: 11/29/19  6:55 AM  Result Value Ref Range   Sodium 136 135 - 145 mmol/L   Potassium 5.0 3.5 - 5.1 mmol/L    Chloride 93 (L) 98 - 111 mmol/L   CO2 27 22 - 32 mmol/L   Glucose, Bld 473 (H) 70 - 99 mg/dL   BUN 15 6 - 20 mg/dL   Creatinine, Ser 1.82 (H) 0.61 - 1.24 mg/dL   Calcium 8.5 (L) 8.9 - 10.3 mg/dL   Total Protein 6.8 6.5 - 8.1 g/dL   Albumin 3.7 3.5 - 5.0 g/dL   AST 241 (H) 15 - 41 U/L   ALT 195 (H) 0 - 44 U/L   Alkaline Phosphatase 88 38 - 126 U/L   Total Bilirubin 0.4 0.3 - 1.2 mg/dL   GFR calc non Af Amer 45 (L) >60 mL/min   GFR calc Af Amer 52 (L) >60 mL/min   Anion gap 16 (H) 5 - 15  Ethanol     Status: None   Collection Time: 11/29/19  6:55 AM  Result Value Ref Range   Alcohol, Ethyl (B) <10 <10 mg/dL  POCT I-Stat EG7     Status: Abnormal   Collection Time: 11/29/19  7:04 AM  Result Value Ref Range   pH, Ven 7.116 (LL) 7.250 - 7.430   pCO2, Ven 88.6 (HH) 44.0 - 60.0 mmHg   pO2, Ven 31.0 (LL) 32.0 - 45.0 mmHg   Bicarbonate 28.6 (H) 20.0 - 28.0 mmol/L   TCO2 31 22 - 32 mmol/L   O2 Saturation 40.0 %   Acid-base deficit 4.0 (H) 0.0 - 2.0 mmol/L   Sodium 134 (L) 135 - 145 mmol/L   Potassium 5.0 3.5 - 5.1 mmol/L   Calcium, Ion 1.14 (L) 1.15 - 1.40 mmol/L   HCT 49.0 39.0 - 52.0 %   Hemoglobin 16.7 13.0 - 17.0 g/dL   Patient temperature 98.3 F    Sample type VENOUS    Comment NOTIFIED PHYSICIAN  CBG monitoring, ED     Status: Abnormal   Collection Time: 11/29/19  7:40 AM  Result Value Ref Range   Glucose-Capillary 350 (H) 70 - 99 mg/dL  Respiratory Panel by RT PCR (Flu A&B, Covid) -     Status: None   Collection Time: 11/29/19  8:00 AM  Result Value Ref Range   SARS Coronavirus 2 by RT PCR NEGATIVE NEGATIVE   Influenza A by PCR NEGATIVE NEGATIVE   Influenza B by PCR NEGATIVE NEGATIVE  Urinalysis, Routine w reflex microscopic     Status: Abnormal   Collection Time: 11/29/19  8:20 AM  Result Value Ref Range   Color, Urine YELLOW YELLOW   APPearance HAZY (A) CLEAR   Specific Gravity, Urine >1.030 (H) 1.005 - 1.030   pH 6.0 5.0 - 8.0   Glucose, UA >=500 (A) NEGATIVE  mg/dL   Hgb urine dipstick SMALL (A) NEGATIVE   Bilirubin Urine NEGATIVE NEGATIVE   Ketones, ur NEGATIVE NEGATIVE mg/dL   Protein, ur 100 (A) NEGATIVE mg/dL   Nitrite NEGATIVE NEGATIVE   Leukocytes,Ua NEGATIVE NEGATIVE  Rapid urine drug screen (hospital performed)     Status: Abnormal   Collection Time: 11/29/19  8:20 AM  Result Value Ref Range   Opiates POSITIVE (A) NONE DETECTED   Cocaine NONE DETECTED NONE DETECTED   Benzodiazepines NONE DETECTED NONE DETECTED   Amphetamines NONE DETECTED NONE DETECTED   Tetrahydrocannabinol POSITIVE (A) NONE DETECTED   Barbiturates NONE DETECTED NONE DETECTED  Urinalysis, Microscopic (reflex)     Status: Abnormal   Collection Time: 11/29/19  8:20 AM  Result Value Ref Range   RBC / HPF 0-5 0 - 5 RBC/hpf   WBC, UA 0-5 0 - 5 WBC/hpf   Bacteria, UA FEW (A) NONE SEEN   Squamous Epithelial / LPF 0-5 0 - 5   Hyaline Casts, UA PRESENT    Granular Casts, UA PRESENT    WBC Casts, UA PRESENT   CBG monitoring, ED     Status: Abnormal   Collection Time: 11/29/19  8:45 AM  Result Value Ref Range   Glucose-Capillary 263 (H) 70 - 99 mg/dL  POCT I-Stat EG7     Status: Abnormal   Collection Time: 11/29/19  9:22 AM  Result Value Ref Range   pH, Ven 7.185 (LL) 7.250 - 7.430   pCO2, Ven 86.5 (HH) 44.0 - 60.0 mmHg   pO2, Ven 30.0 (LL) 32.0 - 45.0 mmHg   Bicarbonate 32.7 (H) 20.0 - 28.0 mmol/L   TCO2 35 (H) 22 - 32 mmol/L   O2 Saturation 40.0 %   Acid-Base Excess 1.0 0.0 - 2.0 mmol/L   Sodium 140 135 - 145 mmol/L   Potassium 3.9 3.5 - 5.1 mmol/L   Calcium, Ion 1.18 1.15 - 1.40 mmol/L   HCT 46.0 39.0 - 52.0 %   Hemoglobin 15.6 13.0 - 17.0 g/dL   Patient temperature HIDE    Collection site IV START    Drawn by RT    Sample type VENOUS    Comment VALUES EXPECTED, NO REPEAT   Basic metabolic panel     Status: Abnormal   Collection Time: 11/29/19  9:56 AM  Result Value Ref Range   Sodium 142 135 - 145 mmol/L   Potassium 4.1 3.5 - 5.1 mmol/L   Chloride  100 98 - 111 mmol/L   CO2 33 (H) 22 - 32 mmol/L   Glucose, Bld 198 (H) 70 - 99 mg/dL   BUN 16 6 -  20 mg/dL   Creatinine, Ser 1.46 (H) 0.61 - 1.24 mg/dL   Calcium 8.2 (L) 8.9 - 10.3 mg/dL   GFR calc non Af Amer 58 (L) >60 mL/min   GFR calc Af Amer >60 >60 mL/min   Anion gap 9 5 - 15  SARS Coronavirus 2 Ag (30 min TAT) - Nasal Swab (BD Veritor Kit)     Status: None   Collection Time: 11/29/19 10:45 AM   Specimen: Nasal Swab (BD Veritor Kit)  Result Value Ref Range   SARS Coronavirus 2 Ag NEGATIVE NEGATIVE  MRSA PCR Screening     Status: None   Collection Time: 11/29/19  1:04 PM   Specimen: Nasal Mucosa; Nasopharyngeal  Result Value Ref Range   MRSA by PCR NEGATIVE NEGATIVE  Glucose, capillary     Status: Abnormal   Collection Time: 11/29/19  1:11 PM  Result Value Ref Range   Glucose-Capillary 130 (H) 70 - 99 mg/dL   Comment 1 Notify RN    Comment 2 Document in Chart   Hemoglobin A1c     Status: Abnormal   Collection Time: 11/29/19  2:27 PM  Result Value Ref Range   Hgb A1c MFr Bld 8.8 (H) 4.8 - 5.6 %   Mean Plasma Glucose 205.86 mg/dL  Glucose, capillary     Status: Abnormal   Collection Time: 11/29/19  4:40 PM  Result Value Ref Range   Glucose-Capillary 187 (H) 70 - 99 mg/dL   Comment 1 Notify RN    Comment 2 Document in Chart   Glucose, capillary     Status: Abnormal   Collection Time: 11/29/19  9:52 PM  Result Value Ref Range   Glucose-Capillary 141 (H) 70 - 99 mg/dL   Comment 1 Notify RN    Comment 2 Document in Chart    Imaging Studies: DG Chest Port 1 View  Result Date: 11/29/2019 CLINICAL DATA:  Hypoxia. EXAM: PORTABLE CHEST 1 VIEW COMPARISON:  Radiograph 02/18/2019 FINDINGS: Upper normal heart size with unchanged mediastinal contours. Streaky bibasilar atelectasis. No confluent airspace disease, pulmonary edema, pleural effusion or pneumothorax. No acute osseous abnormalities. IMPRESSION: Streaky bibasilar atelectasis. Electronically Signed   By: Keith Rake  M.D.   On: 11/29/2019 06:54    ED COURSE and MDM  Nursing notes, initial and subsequent vitals signs, including pulse oximetry, reviewed and interpreted by myself.  Vitals:   11/29/19 1930 11/29/19 2000 11/29/19 2014 11/29/19 2100  BP:  134/76  126/70  Pulse:  81  68  Resp:  14  12  Temp: 98.2 F (36.8 C)     TempSrc: Oral     SpO2:  (!) 89% (!) 89% 90%  Weight:      Height:       Medications  insulin regular, human (MYXREDLIN) 100 units/ 100 mL infusion (has no administration in time range)  dextrose 5 %-0.45 % sodium chloride infusion (has no administration in time range)  dextrose 50 % solution 0-50 mL (has no administration in time range)  sodium chloride 0.9 % bolus 2,000 mL (2,000 mLs Intravenous New Bag/Given 11/29/19 0632)   6:25 AM ED [employer] hyperglycemia protocol initiated.  Patient placed on oxygen by nasal cannula for original oxygen saturation 86 to 88% on room air.  7:00 AM Signed out to Dr. Dorie Rank.  PROCEDURES  Procedures  CRITICAL CARE Performed by: Karen Chafe Gladie Gravette Total critical care time: 30 minutes Critical care time was exclusive of separately billable procedures and treating other patients. Critical  care was necessary to treat or prevent imminent or life-threatening deterioration. Critical care was time spent personally by me on the following activities: development of treatment plan with patient and/or surrogate as well as nursing, discussions with consultants, evaluation of patient's response to treatment, examination of patient, obtaining history from patient or surrogate, ordering and performing treatments and interventions, ordering and review of laboratory studies, ordering and review of radiographic studies, pulse oximetry and re-evaluation of patient's condition.   ED DIAGNOSES     ICD-10-CM   1. Accidental overdose of heroin, initial encounter ([place])  T40.1X1A   2. Hyperglycemia  R73.9   3. Acute on chronic respiratory failure with  hypercapnia ([place])  J96.22        Shanon Rosser, MD 11/29/19 2242

## 2019-11-29 NOTE — ED Notes (Signed)
Patient placed on 3L [place] to maintain oxygen saturations greater than 90%. Patient oxygen saturations increased to 93% post Joes. Patient on monitor will continue to assess as necessary.

## 2019-11-29 NOTE — H&P (Signed)
History and Physical    Steven Dudley VQQ:[id number] DOB: 1976-01-21 DOA: 11/29/2019  PCP: Macy Mis, MD Patient coming from: Home  Chief Complaint: Unintentional overdose  HPI: Steven Dudley is a 44 y.o. male with medical history significant of COPD, diabetes mellitus, type 2, hypertension, substance abuse, OSA. Patient reports sniffing heroin last night. He recently started using opiates to "try something new." He does not remember what happened afterwards but per ED report and chart review, patient was found with agonal breathing. He received two doses of Narcan (0.5 mg dose with EMS and 0.2 mg dose in the ED) with prompt response but continued to have hypoxia and hypercapnia.  ED Course: Vitals: Afebrile, pulse of 70s, BP of 90s/50-60s, On BiPAP Labs: VBG with pH of 7.116 > 7.185, pCO2 88.6 >86.5, pO2 31>30. Creatinine of 1.82 > 1.46 Imaging: Chest x-ray significant for streaky atelectasis Medications/Course: Narcan, Bipap, IV fluids, IV insulin  Review of Systems: Review of Systems  Constitutional: Negative for chills and fever.  Respiratory: Positive for shortness of breath.   Cardiovascular: Negative for chest pain.  Gastrointestinal: Negative for abdominal pain, constipation, diarrhea, nausea and vomiting.  All other systems reviewed and are negative.   Past Medical History:  Diagnosis Date  . Acid reflux   . Acute respiratory failure (HCC)   . Alcohol abuse   . Anxiety   . Asthma    daily and prn inhalers  . CHF (congestive heart failure) (HCC)   . Chondromalacia of right patella 09/2014  . COPD (chronic obstructive pulmonary disease) (HCC)   . Diabetes mellitus without complication (HCC)    TYPE II  . Gastritis   . History of traumatic brain injury 2006  . Hyperglycemia   . Hypertension    states under control with med., has been on med. x 1 yr.  . Meniscal cyst 09/2014   right knee  . Morbid obesity (HCC)   . Obesity   . Renal disorder   . Sleep  apnea   . Smokers' cough [facility])     Past Surgical History:  Procedure Laterality Date  . APPENDECTOMY    . FOREIGN BODY REMOVAL Left 02/27/2008   hand  . INCISION AND DRAINAGE ABSCESS Left 03/01/2008   hand  . IRRIGATION AND DEBRIDEMENT ABSCESS Left 02/27/2008   hand  . KNEE ARTHROSCOPY WITH MEDIAL MENISECTOMY Right 09/25/2014   Procedure: RIGHT KNEE SCOPE CHONDROPLASTY/MEDIAL MENISCECTOMY;  Surgeon: Loreta Ave, MD;  Location: [facility];  Service: Orthopedics;  Laterality: Right;  ANESTHESIA: GENERAL, KNEE BLOCK  . TENOSYNOVECTOMY Left 02/27/2008   flexor tenosynovectomy left hand  . TONSILLECTOMY    . TONSILLECTOMY AND ADENOIDECTOMY    . WISDOM TOOTH EXTRACTION       reports that he has been smoking cigarettes. He has a 26.00 pack-year smoking history. He has quit using smokeless tobacco. He reports current alcohol use. He reports current drug use. Drugs: Marijuana and IV.  Allergies  Allergen Reactions  . Acetaminophen Other (See Comments)    GI BLEEDING Other reaction(s): Other GI BLEEDING  . Shellfish Allergy Nausea And Vomiting, Nausea Only and Other (See Comments)    Headache, heat flashes. Other reaction(s): Other Headache, heat flashes.    Family History  Problem Relation Age of Onset  . Stomach cancer Mother   . Cancer Mother    Prior to Admission medications   Medication Sig Start Date End Date Taking? Authorizing Provider  albuterol (VENTOLIN HFA) 108 (90 Base) MCG/ACT inhaler  Inhale 2 puffs into the lungs every 6 (six) hours as needed for wheezing or shortness of breath. 02/20/19   Mariel Aloe, MD  aspirin 81 MG chewable tablet Chew 81 mg by mouth daily.    [provider]  atorvastatin (LIPITOR) 20 MG tablet Take 20 mg by mouth daily.    [provider]  famotidine (PEPCID) 20 MG tablet Take 1 tablet (20 mg total) by mouth 2 (two) times daily. 02/17/19   Charlesetta Shanks, MD  furosemide (LASIX) 20 MG tablet Take 1 tablet  (20 mg total) by mouth 2 (two) times daily. Patient taking differently: Take 40 mg by mouth daily.  04/25/18   Reyne Dumas, MD  ipratropium-albuterol (DUONEB) 0.5-2.5 (3) MG/3ML SOLN Take 3 mLs by nebulization every 4 (four) hours as needed (wheezing and shortness of breath). 02/20/19   Mariel Aloe, MD  liraglutide (VICTOZA) 18 MG/3ML SOPN Inject into the skin. 04/17/19   [provider]  losartan (COZAAR) 50 MG tablet Take 50 mg by mouth daily. 08/25/18   [provider]  metFORMIN (GLUCOPHAGE) 500 MG tablet Take 1 tablet (500 mg total) by mouth 2 (two) times daily with a meal. Patient taking differently: Take 1,000 mg by mouth daily with breakfast.  10/08/15   Micheline Chapman, NP  mometasone-formoterol (DULERA) 200-5 MCG/ACT AERO Inhale 1 puff into the lungs 2 (two) times daily. 02/20/19   Mariel Aloe, MD  tiotropium (SPIRIVA) 18 MCG inhalation capsule Place 18 mcg into inhaler and inhale daily.    [provider]  varenicline (CHANTIX STARTING MONTH PAK) 0.5 MG X 11 & 1 MG X 42 tablet Take 1 0.5 mg tablet once daily for 3 days, increase to 1 0.5 mg tablet twice daily for 4 days, increase to 1 1 mg tablet twice daily. 05/07/19   Laurin Coder, MD    Physical Exam:  Physical Exam Vitals and nursing note reviewed.  Constitutional:      General: He is not in acute distress.    Appearance: He is well-developed. He is not diaphoretic.  Eyes:     Conjunctiva/sclera: Conjunctivae normal.     Pupils: Pupils are equal, round, and reactive to light.  Cardiovascular:     Rate and Rhythm: Normal rate and regular rhythm.     Heart sounds: Normal heart sounds. No murmur.  Pulmonary:     Effort: Pulmonary effort is normal. No tachypnea, accessory muscle usage or respiratory distress.     Breath sounds: Decreased breath sounds present. No wheezing or rales.  Abdominal:     General: Bowel sounds are normal. There is no distension.     Palpations: Abdomen is soft.      Tenderness: There is no abdominal tenderness. There is no guarding or rebound.  Musculoskeletal:        General: No tenderness. Normal range of motion.     Cervical back: Normal range of motion.  Lymphadenopathy:     Cervical: No cervical adenopathy.  Skin:    General: Skin is warm and dry.  Neurological:     Mental Status: He is alert and oriented to person, place, and time.      Labs on Admission: I have personally reviewed following labs and imaging studies  CBC: Recent Labs  Lab 11/29/19 0619 11/29/19 0704 11/29/19 0922  WBC 14.8*  --   --   NEUTROABS 11.3*  --   --   HGB 16.0 16.7 15.6  HCT 51.4 49.0 46.0  MCV 93.5  --   --   PLT 273  --   --     Basic Metabolic Panel: Recent Labs  Lab 11/29/19 0655 11/29/19 0704 11/29/19 0922  NA 136 134* 140  K 5.0 5.0 3.9  CL 93*  --   --   CO2 27  --   --   GLUCOSE 473*  --   --   BUN 15  --   --   CREATININE 1.82*  --   --   CALCIUM 8.5*  --   --     GFR: Estimated Creatinine Clearance: 66.3 mL/min (A) (by C-G formula based on SCr of 1.82 mg/dL (H)).  Liver Function Tests: Recent Labs  Lab 11/29/19 0655  AST 241*  ALT 195*  ALKPHOS 88  BILITOT 0.4  PROT 6.8  ALBUMIN 3.7   No results for input(s): LIPASE, AMYLASE in the last 168 hours. No results for input(s): AMMONIA in the last 168 hours.  Coagulation Profile: No results for input(s): INR, PROTIME in the last 168 hours.  Cardiac Enzymes: No results for input(s): CKTOTAL, CKMB, CKMBINDEX, TROPONINI in the last 168 hours.  BNP (last 3 results) No results for input(s): PROBNP in the last 8760 hours.  HbA1C: No results for input(s): HGBA1C in the last 72 hours.  CBG: Recent Labs  Lab 11/29/19 0627 11/29/19 0740 11/29/19 0845  GLUCAP 448* 350* 263*    Lipid Profile: No results for input(s): CHOL, HDL, LDLCALC, TRIG, CHOLHDL, LDLDIRECT in the last 72 hours.  Thyroid Function Tests: No results for input(s): TSH, T4TOTAL, FREET4, T3FREE,  THYROIDAB in the last 72 hours.  Anemia Panel: No results for input(s): VITAMINB12, FOLATE, FERRITIN, TIBC, IRON, RETICCTPCT in the last 72 hours.  Urine analysis:    Component Value Date/Time   COLORURINE YELLOW 11/29/2019 0820   APPEARANCEUR HAZY (A) 11/29/2019 0820   LABSPEC >1.030 (H) 11/29/2019 0820   PHURINE 6.0 11/29/2019 0820   GLUCOSEU >=500 (A) 11/29/2019 0820   HGBUR SMALL (A) 11/29/2019 0820   BILIRUBINUR NEGATIVE 11/29/2019 0820   KETONESUR NEGATIVE 11/29/2019 0820   PROTEINUR 100 (A) 11/29/2019 0820   UROBILINOGEN 0.2 08/11/2013 1315   NITRITE NEGATIVE 11/29/2019 0820   LEUKOCYTESUR NEGATIVE 11/29/2019 0820     Radiological Exams on Admission: DG Chest Port 1 View  Result Date: 11/29/2019 CLINICAL DATA:  Hypoxia. EXAM: PORTABLE CHEST 1 VIEW COMPARISON:  Radiograph 02/18/2019 FINDINGS: Upper normal heart size with unchanged mediastinal contours. Streaky bibasilar atelectasis. No confluent airspace disease, pulmonary edema, pleural effusion or pneumothorax. No acute osseous abnormalities. IMPRESSION: Streaky bibasilar atelectasis. Electronically Signed   By: Narda Rutherford M.D.   On: 11/29/2019 06:54    Assessment/Plan Principal Problem:   Acute respiratory failure with hypercapnia (HCC) Active Problems:   Hypertension   COPD (chronic obstructive pulmonary disease) (HCC)   Diabetes mellitus type 2 in obese (HCC)   Acute respiratory failure with hypoxia and hypercapnia Secondary to respiratory depression from opiate overdose. It is possible there could be a chronic component secondary to underlying COPD and OSA. -Wean to room air, goal SpO2 of >88%  Opiate overdose, unintentional Polysubstance abuse Patient has a history of substance abuse 20 years ago. He previously use cocaine and crack. He quit but recently started opiates (heroin) to try something new. He has not injected drugs. -TOC consult -Narcan prn  Low blood pressure Not quite hypotensive at  this time. Will hold off on antihypertensives. Currently on IV fluids -Continue IV fluids  COPD OSA Patient states he is supposed to get a machine and his outpatient services are working on securing one. He is unsure if this is a CPAP or BiPAP machine. He is on [place], Duoneb and albuterol as an outpatient. Currently not in exacerbation. -Duoneb prn -Continue Spiriva and Dulera  Diabetes mellitus, type 2 Patient is on metformin, Victoza as an outpatient. He received IV insulin in the ED for concern of DKA which was discontinued in setting of significant hypercapnia. Blood sugar of 473 on arrival to the ED -SSI qAC  Essential hypertension Patient is on losartan as an outpatient -Hold antihypertensives as patient's blood pressure is soft  Hyperlipidemia -Continue Lipitor when able to take PO  Tobacco use Patient was on Chantix as an outpatient but states he stopped. Patient counseled. He has no desire to quit. -Nicotine patch 21 mg daily  Obesity Body mass index is 42.29 kg/m.   DVT prophylaxis: Lovenox Code Status: Full code Family Communication: None at bedside Disposition Plan: Stepdown unit. Discharge pending improvement of mental status, discontinuation of BiPAP Consults called: None Admission status: Observation   Jacquelin Hawking, MD [facility] 11/29/2019, 10:05 AM

## 2019-11-29 NOTE — ED Provider Notes (Addendum)
Patient initially seen by Dr. Read Drivers.  Please see his note.  Patient presents after an accidental opiate overdose  .Critical Care Performed by: Linwood Dibbles, MD Authorized by: Linwood Dibbles, MD   Critical care provider statement:    Critical care time (minutes):  30   Critical care was time spent personally by me on the following activities:  Discussions with consultants, evaluation of patient's response to treatment, examination of patient, ordering and performing treatments and interventions, ordering and review of laboratory studies, ordering and review of radiographic studies, pulse oximetry, re-evaluation of patient's condition, obtaining history from patient or surrogate and review of old charts     Clinical Course as of Nov 28 932  Fri Nov 29, 2019  0838 Labs reviewed.  Metabolic panel notable for hyperglycemia.  Elevated creatinine as well as LFTs.  Venous blood gas shows respiratory acidosis.   [JK]  [id number] Additional Narcan dose was given previously.  Patient is more alert now and is talking on the telephone.  BiPAP discontinued.  We will continue to monitor.   [JK]  0853 Blood sugar decreasing. 360s.  Pt not in DKA.  Not hyperosmolar syndrome.  Will stop insulin infusion.   [JK]  [id number] Repeat VBG shows persistent resp acidosis.  Will stat bipap   [JK]  0934 Reviewed findings with patient.  Explained to him the importance of continue treatment hospitalization.  Patient initially wanted to leave but he agrees at this point.   [JK]    Clinical Course User Index [JK] Linwood Dibbles, MD   Patient presented to ED for evaluation of accidental opiate overdose.  Patient does have history of COPD.  Patient provided additional information that he supposed to be using CPAP at home.  He likely has a component of chronic hypercapnia associated with COPD and his severe obstructive sleep apnea.  Patient has been monitored in the ED however he continues to remain hypercapnic.  He has been given  additional doses of Narcan.  In the ED the patient is awake but still nods off when not spoken to.  We will have him continue BiPAP.  Initially the patient was wanting to leave AMA because he wants to go smoke a cigarette.  Right now he agrees to hospitalization and continue treatment as I explained to him the severe abnormalities noted during his work-up.   Linwood Dibbles, MD 11/29/19 5573    Linwood Dibbles, MD 11/29/19 1004

## 2019-11-29 NOTE — ED Notes (Signed)
Patient endorses using heroin and alcohol earlier today.

## 2019-11-29 NOTE — Progress Notes (Signed)
Notified MD Nettey in regards to patient's Respiratory Panel by RT PCR (Flu A&B) negative for Covid, Flu A/B. MD Nettey ordered to remove Airborne and Contact Precautions.

## 2019-11-29 NOTE — ED Triage Notes (Signed)
Patient arrived via EMS c/o unresponsive overdose. Patient administered 0.5 of ncarcan at scene by EMS with increased response rate. Patient is Alert with confusion as to situation. VS at this time WDL.

## 2019-11-30 DIAGNOSIS — J9602 Acute respiratory failure with hypercapnia: Secondary | ICD-10-CM | POA: Diagnosis not present

## 2019-11-30 LAB — CBC
HCT: 45.5 % (ref 39.0–52.0)
Hemoglobin: 14.4 g/dL (ref 13.0–17.0)
MCH: 29.4 pg (ref 26.0–34.0)
MCHC: 31.6 g/dL (ref 30.0–36.0)
MCV: 93 fL (ref 80.0–100.0)
Platelets: 237 10*3/uL (ref 150–400)
RBC: 4.89 MIL/uL (ref 4.22–5.81)
RDW: 12.9 % (ref 11.5–15.5)
WBC: 12.5 10*3/uL — ABNORMAL HIGH (ref 4.0–10.5)
nRBC: 0 % (ref 0.0–0.2)

## 2019-11-30 LAB — BASIC METABOLIC PANEL
Anion gap: 9 (ref 5–15)
BUN: 13 mg/dL (ref 6–20)
CO2: 31 mmol/L (ref 22–32)
Calcium: 8.7 mg/dL — ABNORMAL LOW (ref 8.9–10.3)
Chloride: 100 mmol/L (ref 98–111)
Creatinine, Ser: 0.86 mg/dL (ref 0.61–1.24)
GFR calc Af Amer: >60 mL/min (ref >60)
GFR calc non Af Amer: >60 mL/min (ref >60)
Glucose, Bld: 175 mg/dL — ABNORMAL HIGH (ref 70–99)
Potassium: 3.8 mmol/L (ref 3.5–5.1)
Sodium: 140 mmol/L (ref 135–145)

## 2019-11-30 LAB — GLUCOSE, CAPILLARY: Glucose-Capillary: 180 mg/dL — ABNORMAL HIGH (ref 70–99)

## 2019-11-30 MED ORDER — SALINE SPRAY 0.65 % NA SOLN
1.0000 | NASAL | Status: DC | PRN
  Administered 2019-11-30: 1 via NASAL
  Filled 2019-11-30: qty 44

## 2019-11-30 NOTE — Discharge Summary (Signed)
Physician Discharge Summary  Steven Dudley FXT:[id number] DOB: 25-Jan-1976 DOA: 11/29/2019  PCP: Katherina Mires, MD  Admit date: 11/29/2019 Discharge date: 11/30/2019  Time spent: 45 minutes  Recommendations for Outpatient Follow-up:  -To be discharged home today. -Needs follow-up with primary care physician in 2 weeks following discharge.  Discharge Diagnoses:  Principal Problem:   Acute respiratory failure with hypercapnia (HCC) Active Problems:   Hypertension   COPD (chronic obstructive pulmonary disease) ([place])   Diabetes mellitus type 2 in obese [facility])   Discharge Condition: Stable and improved  Filed Weights   11/29/19 0607 11/29/19 1300  Weight: 122.5 kg 121.9 kg    History of present illness:  As per Dr. Lonny Prude on 2/19: Steven Dudley is a 44 y.o. male with medical history significant of COPD, diabetes mellitus, type 2, hypertension, substance abuse, OSA. Patient reports sniffing heroin last night. He recently started using opiates to "try something new." He does not remember what happened afterwards but per ED report and chart review, patient was found with agonal breathing. He received two doses of Narcan (0.5 mg dose with EMS and 0.2 mg dose in the ED) with prompt response but continued to have hypoxia and hypercapnia  Hospital Course:   Acute respiratory failure with hypoxia and hypercapnia -Secondary to decreased respiratory drive due to heroin overdose. -Suspect chronic component due to underlying COPD (not exacerbated) and obstructive sleep apnea. -He did require temporary BiPAP but has been off for about 12 hours without issues. He has had a sleep study within the past month that confirmed the diagnosis of OSA and he is in the process of having a CPAP machine set up at home.  Opiate overdose, unintentional History of polysubstance abuse -He previously used cocaine and crack, quit about 20 years ago. -He recently started using heroin to "try something  new".  States he knows this can kill him and will never try again.  Benign essential hypertension -Continue home blood pressure medications.  Hyperlipidemia -Continue Lipitor.  Morbid obesity -Body mass index is 42.09 kg/m. -Noted  Tobacco abuse -Patient is not interested in cessation at this time.   Procedures:  None  Consultations:  None  Discharge Instructions  Discharge Instructions    Diet Carb Modified   Complete by: As directed    Increase activity slowly   Complete by: As directed      Allergies as of 11/30/2019      Reactions   Acetaminophen Other (See Comments)   GI BLEEDING Other reaction(s): Other GI BLEEDING   Shellfish Allergy Nausea And Vomiting, Nausea Only, Other (See Comments)   Headache, heat flashes. Other reaction(s): Other Headache, heat flashes.      Medication List    STOP taking these medications   Chantix Starting Month Pak 0.5 MG X 11 & 1 MG X 42 tablet Generic drug: varenicline     TAKE these medications   albuterol 108 (90 Base) MCG/ACT inhaler Commonly known as: VENTOLIN HFA Inhale 2 puffs into the lungs every 6 (six) hours as needed for wheezing or shortness of breath.   albuterol (2.5 MG/3ML) 0.083% nebulizer solution Commonly known as: PROVENTIL Take 2.5 mg by nebulization every 4 (four) hours as needed for wheezing or shortness of breath.   amLODipine 5 MG tablet Commonly known as: NORVASC Take 5 mg by mouth at bedtime.   aspirin 81 MG chewable tablet Chew 81 mg by mouth daily.   atorvastatin 20 MG tablet Commonly known as: LIPITOR Take 20  mg by mouth daily.   famotidine 20 MG tablet Commonly known as: PEPCID Take 1 tablet (20 mg total) by mouth 2 (two) times daily.   furosemide 20 MG tablet Commonly known as: LASIX Take 1 tablet (20 mg total) by mouth 2 (two) times daily. What changed:   how much to take  when to take this   ipratropium-albuterol 0.5-2.5 (3) MG/3ML Soln Commonly known as:  DUONEB Take 3 mLs by nebulization every 4 (four) hours as needed (wheezing and shortness of breath).   liraglutide 18 MG/3ML Sopn Commonly known as: VICTOZA Inject 1.2 mg into the skin daily.   losartan 100 MG tablet Commonly known as: COZAAR Take 100 mg by mouth daily.   metFORMIN 500 MG 24 hr tablet Commonly known as: GLUCOPHAGE-XR Take 2,000 mg by mouth every morning.   mometasone-formoterol 200-5 MCG/ACT Aero Commonly known as: DULERA Inhale 1 puff into the lungs 2 (two) times daily.   omeprazole 20 MG capsule Commonly known as: PRILOSEC Take 20 mg by mouth daily.   sildenafil 20 MG tablet Commonly known as: REVATIO Take 40-100 mg by mouth as needed for erectile dysfunction.      Allergies  Allergen Reactions  . Acetaminophen Other (See Comments)    GI BLEEDING Other reaction(s): Other GI BLEEDING  . Shellfish Allergy Nausea And Vomiting, Nausea Only and Other (See Comments)    Headache, heat flashes. Other reaction(s): Other Headache, heat flashes.   Follow-up Information    Katherina Mires, MD. Schedule an appointment as soon as possible for a visit in 2 week(s).   Specialty: Family Medicine Contact information: [place] [address] [phone]            The results of significant diagnostics from this hospitalization (including imaging, microbiology, ancillary and laboratory) are listed below for reference.    Significant Diagnostic Studies: DG Chest Port 1 View  Result Date: 11/29/2019 CLINICAL DATA:  Hypoxia. EXAM: PORTABLE CHEST 1 VIEW COMPARISON:  Radiograph 02/18/2019 FINDINGS: Upper normal heart size with unchanged mediastinal contours. Streaky bibasilar atelectasis. No confluent airspace disease, pulmonary edema, pleural effusion or pneumothorax. No acute osseous abnormalities. IMPRESSION: Streaky bibasilar atelectasis. Electronically Signed   By: Keith Rake M.D.   On: 11/29/2019 06:54     Microbiology: Recent Results (from the past 240 hour(s))  Respiratory Panel by RT PCR (Flu A&B, Covid) -     Status: None   Collection Time: 11/29/19  8:00 AM  Result Value Ref Range Status   SARS Coronavirus 2 by RT PCR NEGATIVE NEGATIVE Final    Comment: (NOTE) SARS-CoV-2 target nucleic acids are NOT DETECTED. The SARS-CoV-2 RNA is generally detectable in upper respiratoy specimens during the acute phase of infection. The lowest concentration of SARS-CoV-2 viral copies this assay can detect is 131 copies/mL. A negative result does not preclude SARS-Cov-2 infection and should not be used as the sole basis for treatment or other patient management decisions. A negative result may occur with  improper specimen collection/handling, submission of specimen other than nasopharyngeal swab, presence of viral mutation(s) within the areas targeted by this assay, and inadequate number of viral copies (<131 copies/mL). A negative result must be combined with clinical observations, patient history, and epidemiological information. The expected result is Negative. Fact Sheet for Patients:  PinkCheek.be Fact Sheet for Healthcare Providers:  GravelBags.it This test is not yet ap proved or cleared by the [place] FDA and  has been authorized for detection and/or diagnosis  of SARS-CoV-2 by FDA under an Emergency Use Authorization (EUA). This EUA will remain  in effect (meaning this test can be used) for the duration of the COVID-19 declaration under Section 564(b)(1) of the Act, 21 U.S.C. section 360bbb-3(b)(1), unless the authorization is terminated or revoked sooner.    Influenza A by PCR NEGATIVE NEGATIVE Final   Influenza B by PCR NEGATIVE NEGATIVE Final    Comment: (NOTE) The Xpert Xpress SARS-CoV-2/FLU/RSV assay is intended as an aid in  the diagnosis of influenza from Nasopharyngeal swab specimens and  should not be used  as a sole basis for treatment. Nasal washings and  aspirates are unacceptable for Xpert Xpress SARS-CoV-2/FLU/RSV  testing. Fact Sheet for Patients: PinkCheek.be Fact Sheet for Healthcare Providers: GravelBags.it This test is not yet approved or cleared by the [place] FDA and  has been authorized for detection and/or diagnosis of SARS-CoV-2 by  FDA under an Emergency Use Authorization (EUA). This EUA will remain  in effect (meaning this test can be used) for the duration of the  Covid-19 declaration under Section 564(b)(1) of the Act, 21  U.S.C. section 360bbb-3(b)(1), unless the authorization is  terminated or revoked. Performed at [facility], [place] [address] [place], [place] [address]   SARS Coronavirus 2 Ag (30 min TAT) - Nasal Swab (BD Veritor Kit)     Status: None   Collection Time: 11/29/19 10:45 AM   Specimen: Nasal Swab (BD Veritor Kit)  Result Value Ref Range Status   SARS Coronavirus 2 Ag NEGATIVE NEGATIVE Final    Comment: (NOTE) SARS-CoV-2 antigen NOT DETECTED.  Negative results are presumptive.  Negative results do not preclude SARS-CoV-2 infection and should not be used as the sole basis for treatment or other patient management decisions, including infection  control decisions, particularly in the presence of clinical signs and  symptoms consistent with COVID-19, or in those who have been in contact with the virus.  Negative results must be combined with clinical observations, patient history, and epidemiological information. The expected result is Negative. Fact Sheet for Patients: PodPark.tn Fact Sheet for Healthcare Providers: GiftContent.is This test is not yet approved or cleared by the [place] FDA and  has been authorized for detection and/or diagnosis of SARS-CoV-2 by FDA under an Emergency Use Authorization (EUA).  This EUA  will remain in effect (meaning this test can be used) for the duration of  the COVID-19 de claration under Section 564(b)(1) of the Act, 21 U.S.C. section 360bbb-3(b)(1), unless the authorization is terminated or revoked sooner. Performed at [facility], [address] [place], [place] [address]   MRSA PCR Screening     Status: None   Collection Time: 11/29/19  1:04 PM   Specimen: Nasal Mucosa; Nasopharyngeal  Result Value Ref Range Status   MRSA by PCR NEGATIVE NEGATIVE Final    Comment:        The GeneXpert MRSA Assay (FDA approved for NASAL specimens only), is one component of a comprehensive MRSA colonization surveillance program. It is not intended to diagnose MRSA infection nor to guide or monitor treatment for MRSA infections. Performed at [facility], [place] [address] [place], [place] [address]      Labs: Basic Metabolic Panel: Recent Labs  Lab 11/29/19 0655 11/29/19 0704 11/29/19 0922 11/29/19 0956 11/30/19 0236  NA 136 134* 140 142 140  K 5.0 5.0 3.9 4.1 3.8  CL 93*  --   --  100 100  CO2  27  --   --  33* 31  GLUCOSE 473*  --   --  198* 175*  BUN 15  --   --  16 13  CREATININE 1.82*  --   --  1.46* 0.86  CALCIUM 8.5*  --   --  8.2* 8.7*   Liver Function Tests: Recent Labs  Lab 11/29/19 0655  AST 241*  ALT 195*  ALKPHOS 88  BILITOT 0.4  PROT 6.8  ALBUMIN 3.7   No results for input(s): LIPASE, AMYLASE in the last 168 hours. No results for input(s): AMMONIA in the last 168 hours. CBC: Recent Labs  Lab 11/29/19 0619 11/29/19 0704 11/29/19 0922 11/30/19 0236  WBC 14.8*  --   --  12.5*  NEUTROABS 11.3*  --   --   --   HGB 16.0 16.7 15.6 14.4  HCT 51.4 49.0 46.0 45.5  MCV 93.5  --   --  93.0  PLT 273  --   --  237   Cardiac Enzymes: No results for input(s): CKTOTAL, CKMB, CKMBINDEX, TROPONINI in the last 168 hours. BNP: BNP (last 3 results) Recent Labs    02/18/19 1609  BNP 51.1    ProBNP (last 3  results) No results for input(s): PROBNP in the last 8760 hours.  CBG: Recent Labs  Lab 11/29/19 0845 11/29/19 1311 11/29/19 1640 11/29/19 2152 11/30/19 0748  GLUCAP 263* 130* 187* 141* 180*       Signed:  Lelon Frohlich  [facility] Pager: [phone] 11/30/2019, 9:10 AM

## 2022-09-14 ENCOUNTER — Institutional Professional Consult (permissible substitution): Payer: Medicaid Other | Admitting: Pulmonary Disease

## 2022-10-03 ENCOUNTER — Other Ambulatory Visit: Payer: Self-pay

## 2022-10-03 ENCOUNTER — Emergency Department (HOSPITAL_BASED_OUTPATIENT_CLINIC_OR_DEPARTMENT_OTHER): Payer: Medicaid Other

## 2022-10-03 ENCOUNTER — Encounter (HOSPITAL_BASED_OUTPATIENT_CLINIC_OR_DEPARTMENT_OTHER): Payer: Self-pay

## 2022-10-03 ENCOUNTER — Inpatient Hospital Stay (HOSPITAL_BASED_OUTPATIENT_CLINIC_OR_DEPARTMENT_OTHER)
Admission: EM | Admit: 2022-10-03 | Discharge: 2022-10-08 | DRG: 189 | Disposition: A | Discharge status: Home / Self Care | Payer: Medicaid Other | Attending: Internal Medicine | Admitting: Internal Medicine

## 2022-10-03 ENCOUNTER — Encounter (HOSPITAL_COMMUNITY): Payer: Self-pay

## 2022-10-03 DIAGNOSIS — Z7982 Long term (current) use of aspirin: Secondary | ICD-10-CM

## 2022-10-03 DIAGNOSIS — Z720 Tobacco use: Secondary | ICD-10-CM | POA: Insufficient documentation

## 2022-10-03 DIAGNOSIS — Z1152 Encounter for screening for COVID-19: Secondary | ICD-10-CM

## 2022-10-03 DIAGNOSIS — J440 Chronic obstructive pulmonary disease with acute lower respiratory infection: Secondary | ICD-10-CM | POA: Diagnosis present

## 2022-10-03 DIAGNOSIS — J9601 Acute respiratory failure with hypoxia: Secondary | ICD-10-CM

## 2022-10-03 DIAGNOSIS — Z23 Encounter for immunization: Secondary | ICD-10-CM

## 2022-10-03 DIAGNOSIS — Z7951 Long term (current) use of inhaled steroids: Secondary | ICD-10-CM

## 2022-10-03 DIAGNOSIS — J441 Chronic obstructive pulmonary disease with (acute) exacerbation: Secondary | ICD-10-CM | POA: Diagnosis present

## 2022-10-03 DIAGNOSIS — Z7984 Long term (current) use of oral hypoglycemic drugs: Secondary | ICD-10-CM

## 2022-10-03 DIAGNOSIS — Z800 Family history of malignant neoplasm of digestive organs: Secondary | ICD-10-CM

## 2022-10-03 DIAGNOSIS — I110 Hypertensive heart disease with heart failure: Secondary | ICD-10-CM | POA: Diagnosis present

## 2022-10-03 DIAGNOSIS — J9622 Acute and chronic respiratory failure with hypercapnia: Secondary | ICD-10-CM | POA: Diagnosis present

## 2022-10-03 DIAGNOSIS — K219 Gastro-esophageal reflux disease without esophagitis: Secondary | ICD-10-CM | POA: Diagnosis present

## 2022-10-03 DIAGNOSIS — Z91199 Patient's noncompliance with other medical treatment and regimen due to unspecified reason: Secondary | ICD-10-CM

## 2022-10-03 DIAGNOSIS — E119 Type 2 diabetes mellitus without complications: Secondary | ICD-10-CM

## 2022-10-03 DIAGNOSIS — I480 Paroxysmal atrial fibrillation: Secondary | ICD-10-CM | POA: Diagnosis present

## 2022-10-03 DIAGNOSIS — J121 Respiratory syncytial virus pneumonia: Secondary | ICD-10-CM | POA: Insufficient documentation

## 2022-10-03 DIAGNOSIS — J9621 Acute and chronic respiratory failure with hypoxia: Principal | ICD-10-CM | POA: Diagnosis present

## 2022-10-03 DIAGNOSIS — Z87820 Personal history of traumatic brain injury: Secondary | ICD-10-CM

## 2022-10-03 DIAGNOSIS — Z886 Allergy status to analgesic agent status: Secondary | ICD-10-CM

## 2022-10-03 DIAGNOSIS — G4733 Obstructive sleep apnea (adult) (pediatric): Secondary | ICD-10-CM | POA: Diagnosis present

## 2022-10-03 DIAGNOSIS — F1010 Alcohol abuse, uncomplicated: Secondary | ICD-10-CM | POA: Diagnosis present

## 2022-10-03 DIAGNOSIS — I10 Essential (primary) hypertension: Secondary | ICD-10-CM | POA: Diagnosis present

## 2022-10-03 DIAGNOSIS — I5032 Chronic diastolic (congestive) heart failure: Secondary | ICD-10-CM | POA: Diagnosis present

## 2022-10-03 DIAGNOSIS — J210 Acute bronchiolitis due to respiratory syncytial virus: Secondary | ICD-10-CM | POA: Diagnosis present

## 2022-10-03 DIAGNOSIS — R197 Diarrhea, unspecified: Secondary | ICD-10-CM | POA: Diagnosis not present

## 2022-10-03 DIAGNOSIS — E1165 Type 2 diabetes mellitus with hyperglycemia: Secondary | ICD-10-CM | POA: Diagnosis present

## 2022-10-03 DIAGNOSIS — F17210 Nicotine dependence, cigarettes, uncomplicated: Secondary | ICD-10-CM | POA: Diagnosis present

## 2022-10-03 DIAGNOSIS — E785 Hyperlipidemia, unspecified: Secondary | ICD-10-CM | POA: Diagnosis present

## 2022-10-03 DIAGNOSIS — E1169 Type 2 diabetes mellitus with other specified complication: Secondary | ICD-10-CM | POA: Diagnosis present

## 2022-10-03 DIAGNOSIS — Z6841 Body Mass Index (BMI) 40.0 and over, adult: Secondary | ICD-10-CM

## 2022-10-03 DIAGNOSIS — I4891 Unspecified atrial fibrillation: Secondary | ICD-10-CM

## 2022-10-03 DIAGNOSIS — Z79899 Other long term (current) drug therapy: Secondary | ICD-10-CM

## 2022-10-03 LAB — I-STAT VENOUS BLOOD GAS, ED
Acid-Base Excess: 10 mmol/L — ABNORMAL HIGH (ref 0.0–2.0)
Bicarbonate: 39.7 mmol/L — ABNORMAL HIGH (ref 20.0–28.0)
Calcium, Ion: 1.13 mmol/L — ABNORMAL LOW (ref 1.15–1.40)
HCT: 54 % — ABNORMAL HIGH (ref 39.0–52.0)
Hemoglobin: 18.4 g/dL — ABNORMAL HIGH (ref 13.0–17.0)
O2 Saturation: 67 %
Patient temperature: 98.4
Potassium: 3.9 mmol/L (ref 3.5–5.1)
Sodium: 139 mmol/L (ref 135–145)
TCO2: 42 mmol/L — ABNORMAL HIGH (ref 22–32)
pCO2, Ven: 71.8 mmHg (ref 44–60)
pH, Ven: 7.35 (ref 7.25–7.43)
pO2, Ven: 38 mmHg (ref 32–45)

## 2022-10-03 LAB — BASIC METABOLIC PANEL
Anion gap: 9 (ref 5–15)
BUN: 18 mg/dL (ref 6–20)
CO2: 34 mmol/L — ABNORMAL HIGH (ref 22–32)
Calcium: 8.4 mg/dL — ABNORMAL LOW (ref 8.9–10.3)
Chloride: 95 mmol/L — ABNORMAL LOW (ref 98–111)
Creatinine, Ser: 1.09 mg/dL (ref 0.61–1.24)
GFR, Estimated: >60 mL/min (ref >60)
Glucose, Bld: 237 mg/dL — ABNORMAL HIGH (ref 70–99)
Potassium: 3.5 mmol/L (ref 3.5–5.1)
Sodium: 138 mmol/L (ref 135–145)

## 2022-10-03 LAB — CBC
HCT: 52.3 % — ABNORMAL HIGH (ref 39.0–52.0)
Hemoglobin: 16.8 g/dL (ref 13.0–17.0)
MCH: 30.2 pg (ref 26.0–34.0)
MCHC: 32.1 g/dL (ref 30.0–36.0)
MCV: 93.9 fL (ref 80.0–100.0)
Platelets: 229 10*3/uL (ref 150–400)
RBC: 5.57 MIL/uL (ref 4.22–5.81)
RDW: 14.3 % (ref 11.5–15.5)
WBC: 10.2 10*3/uL (ref 4.0–10.5)
nRBC: 0.3 % — ABNORMAL HIGH (ref 0.0–0.2)

## 2022-10-03 LAB — RESP PANEL BY RT-PCR (RSV, FLU A&B, COVID)  RVPGX2
Influenza A by PCR: NEGATIVE
Influenza B by PCR: NEGATIVE
Resp Syncytial Virus by PCR: POSITIVE — AB
SARS Coronavirus 2 by RT PCR: NEGATIVE

## 2022-10-03 LAB — TROPONIN I (HIGH SENSITIVITY)
Troponin I (High Sensitivity): 14 ng/L (ref <18)
Troponin I (High Sensitivity): 14 ng/L (ref <18)

## 2022-10-03 LAB — BRAIN NATRIURETIC PEPTIDE: B Natriuretic Peptide: 43.4 pg/mL (ref 0.0–100.0)

## 2022-10-03 MED ORDER — ALBUTEROL SULFATE (2.5 MG/3ML) 0.083% IN NEBU: 10.0000 mg | INHALATION_SOLUTION | Freq: Once | RESPIRATORY_TRACT | Status: AC

## 2022-10-03 MED ORDER — IPRATROPIUM BROMIDE 0.02 % IN SOLN
RESPIRATORY_TRACT | Status: AC
  Administered 2022-10-03: 1 mg
  Filled 2022-10-03: qty 5

## 2022-10-03 MED ORDER — SODIUM CHLORIDE 0.9 % IV SOLN: INTRAVENOUS | Status: DC | PRN

## 2022-10-03 MED ORDER — METHYLPREDNISOLONE SODIUM SUCC 125 MG IJ SOLR
125.0000 mg | Freq: Once | INTRAMUSCULAR | Status: AC
  Administered 2022-10-03: 125 mg via INTRAVENOUS
  Filled 2022-10-03: qty 2

## 2022-10-03 MED ORDER — IPRATROPIUM BROMIDE 0.02 % IN SOLN: 1.0000 mg | Freq: Once | RESPIRATORY_TRACT | Status: AC

## 2022-10-03 MED ORDER — ALBUTEROL (5 MG/ML) CONTINUOUS INHALATION SOLN: 10.0000 mg/h | INHALATION_SOLUTION | RESPIRATORY_TRACT | Status: DC

## 2022-10-03 MED ORDER — ALBUTEROL SULFATE (2.5 MG/3ML) 0.083% IN NEBU
INHALATION_SOLUTION | RESPIRATORY_TRACT | Status: AC
  Administered 2022-10-03: 10 mg
  Filled 2022-10-03: qty 12

## 2022-10-03 MED ORDER — ALBUTEROL SULFATE HFA 108 (90 BASE) MCG/ACT IN AERS
2.0000 | INHALATION_SPRAY | RESPIRATORY_TRACT | Status: DC | PRN
  Filled 2022-10-03: qty 6.7

## 2022-10-03 MED ORDER — DILTIAZEM HCL-DEXTROSE 125-5 MG/125ML-% IV SOLN (PREMIX)
5.0000 mg/h | INTRAVENOUS | Status: DC
  Administered 2022-10-03: 5 mg/h via INTRAVENOUS
  Filled 2022-10-03 (×2): qty 125

## 2022-10-03 MED ORDER — DILTIAZEM HCL 25 MG/5ML IV SOLN
10.0000 mg | Freq: Once | INTRAVENOUS | Status: AC
  Administered 2022-10-03: 10 mg via INTRAVENOUS
  Filled 2022-10-03: qty 5

## 2022-10-03 MED ORDER — IPRATROPIUM-ALBUTEROL 0.5-2.5 (3) MG/3ML IN SOLN
3.0000 mL | RESPIRATORY_TRACT | Status: DC
  Administered 2022-10-04 – 2022-10-06 (×16): 3 mL via RESPIRATORY_TRACT
  Filled 2022-10-03 (×16): qty 3

## 2022-10-03 MED ORDER — SODIUM CHLORIDE 0.9 % IV BOLUS
1000.0000 mL | Freq: Once | INTRAVENOUS | Status: AC
  Administered 2022-10-03: 1000 mL via INTRAVENOUS

## 2022-10-03 NOTE — Plan of Care (Signed)
[facility] will assume care on arrival to accepting facility. Until arrival, care as per EDP. However, [facility] available 24/7 for questions and assistance.  Nursing staff, please page [facility] Admits and Consults ([phone]) as soon as the patient arrives to the hospital.   

## 2022-10-03 NOTE — ED Provider Notes (Addendum)
[facility] Provider Note   CSN: [id number] Arrival date & time: 10/03/22  1909     History  Chief Complaint  Patient presents with   Shortness of Breath    Steven Dudley is a 46 y.o. male.  Patient here with cough and shortness of breath for the last few days.  History of COPD, CHF, OSA.  Feels like it is a COPD acting up.  Hypoxic in triage and brought back on nonrebreather.  He supposed to have CPAP at night but does not wear.  Used to need oxygen in the past but does not have it anymore.  Denies any fevers or chills or sputum production.  He feels like the weather changes recently has made things worse.  The history is provided by the patient.       Home Medications Prior to Admission medications   Medication Sig Start Date End Date Taking? Authorizing Provider  albuterol (PROVENTIL) (2.5 MG/3ML) 0.083% nebulizer solution Take 2.5 mg by nebulization every 4 (four) hours as needed for wheezing or shortness of breath. 11/26/19   [provider]  albuterol (VENTOLIN HFA) 108 (90 Base) MCG/ACT inhaler Inhale 2 puffs into the lungs every 6 (six) hours as needed for wheezing or shortness of breath. 02/20/19   Mariel Aloe, MD  amLODipine (NORVASC) 5 MG tablet Take 5 mg by mouth at bedtime. 11/25/19   [provider]  aspirin 81 MG chewable tablet Chew 81 mg by mouth daily.    [provider]  atorvastatin (LIPITOR) 20 MG tablet Take 20 mg by mouth daily.    [provider]  famotidine (PEPCID) 20 MG tablet Take 1 tablet (20 mg total) by mouth 2 (two) times daily. 02/17/19   Charlesetta Shanks, MD  furosemide (LASIX) 20 MG tablet Take 1 tablet (20 mg total) by mouth 2 (two) times daily. Patient taking differently: Take 40 mg by mouth daily.  04/25/18   Reyne Dumas, MD  ipratropium-albuterol (DUONEB) 0.5-2.5 (3) MG/3ML SOLN Take 3 mLs by nebulization every 4 (four) hours as needed (wheezing and shortness of  breath). Patient not taking: Reported on 11/29/2019 02/20/19   Mariel Aloe, MD  liraglutide (VICTOZA) 18 MG/3ML SOPN Inject 1.2 mg into the skin daily.  04/17/19   [provider]  losartan (COZAAR) 100 MG tablet Take 100 mg by mouth daily. 10/08/19   [provider]  metFORMIN (GLUCOPHAGE-XR) 500 MG 24 hr tablet Take 2,000 mg by mouth every morning. 10/02/19   [provider]  mometasone-formoterol (DULERA) 200-5 MCG/ACT AERO Inhale 1 puff into the lungs 2 (two) times daily. 02/20/19   Mariel Aloe, MD  omeprazole (PRILOSEC) 20 MG capsule Take 20 mg by mouth daily. 10/02/19   [provider]  sildenafil (REVATIO) 20 MG tablet Take 40-100 mg by mouth as needed for erectile dysfunction. 11/01/19   [provider]      Allergies    Acetaminophen and Shellfish allergy    Review of Systems   Review of Systems  Physical Exam Updated Vital Signs BP 134/81   Pulse 68   Temp 98.4 F (36.9 C) (Oral)   Resp (!) 24   Ht 5\' 8"  (1.727 m)   Wt 129.3 kg   SpO2 93%   BMI 43.33 kg/m  Physical Exam Vitals and nursing note reviewed.  Constitutional:      General: He is not in acute distress.    Appearance: He is well-developed. He is not  ill-appearing.  HENT:     Head: Normocephalic and atraumatic.     Mouth/Throat:     Mouth: Mucous membranes are moist.  Eyes:     Extraocular Movements: Extraocular movements intact.     Conjunctiva/sclera: Conjunctivae normal.     Pupils: Pupils are equal, round, and reactive to light.  Cardiovascular:     Rate and Rhythm: Normal rate and regular rhythm.     Pulses: Normal pulses.     Heart sounds: Normal heart sounds. No murmur heard. Pulmonary:     Effort: Tachypnea present. No respiratory distress.     Breath sounds: Decreased breath sounds and wheezing present. No rhonchi.  Abdominal:     Palpations: Abdomen is soft.     Tenderness: There is no abdominal tenderness.  Musculoskeletal:        General:  No swelling.     Cervical back: Normal range of motion and neck supple.  Skin:    General: Skin is warm and dry.     Capillary Refill: Capillary refill takes less than 2 seconds.  Neurological:     General: No focal deficit present.     Mental Status: He is alert.  Psychiatric:        Mood and Affect: Mood normal.     ED Results / Procedures / Treatments   Labs (all labs ordered are listed, but only abnormal results are displayed) Labs Reviewed  RESP PANEL BY RT-PCR (RSV, FLU A&B, COVID)  [id number] - Abnormal; Notable for the following components:      Result Value   Resp Syncytial Virus by PCR POSITIVE (*)    All other components within normal limits  BASIC METABOLIC PANEL - Abnormal; Notable for the following components:   Chloride 95 (*)    CO2 34 (*)    Glucose, Bld 237 (*)    Calcium 8.4 (*)    All other components within normal limits  CBC - Abnormal; Notable for the following components:   HCT 52.3 (*)    nRBC 0.3 (*)    All other components within normal limits  I-STAT VENOUS BLOOD GAS, ED - Abnormal; Notable for the following components:   pCO2, Ven 71.8 (*)    Bicarbonate 39.7 (*)    TCO2 42 (*)    Acid-Base Excess 10.0 (*)    Calcium, Ion 1.13 (*)    HCT 54.0 (*)    Hemoglobin 18.4 (*)    All other components within normal limits  BRAIN NATRIURETIC PEPTIDE  TROPONIN I (HIGH SENSITIVITY)  TROPONIN I (HIGH SENSITIVITY)    EKG EKG Interpretation  Date/Time:  Monday October 03 2022 19:27:47 EST Ventricular Rate:  104 PR Interval:  151 QRS Duration: 86 QT Interval:  338 QTC Calculation: 445 R Axis:   79 Text Interpretation: Sinus tachycardia Atrial premature complex Confirmed by Lennice Sites ([id number]) on 10/03/2022 7:33:18 PM  Radiology DG Chest Portable 1 View  Result Date: 10/03/2022 CLINICAL DATA:  Shortness of breath. EXAM: PORTABLE CHEST 1 VIEW COMPARISON:  Chest radiograph dated 11/29/2019. FINDINGS: Faint bibasilar densities, likely atelectasis.  No focal consolidation, pleural effusion, pneumothorax. Stable cardiac silhouette. No acute osseous pathology. IMPRESSION: No active disease. Electronically Signed   By: Anner Crete M.D.   On: 10/03/2022 19:54    Procedures .Critical Care  Performed by: Lennice Sites, DO Authorized by: Lennice Sites, DO   Critical care provider statement:    Critical care time (minutes):  35   Critical care was necessary to treat  or prevent imminent or life-threatening deterioration of the following conditions:  Respiratory failure   Critical care was time spent personally by me on the following activities:  Blood draw for specimens, development of treatment plan with patient or surrogate, discussions with primary provider, evaluation of patient's response to treatment, examination of patient, obtaining history from patient or surrogate, ordering and performing treatments and interventions, ordering and review of laboratory studies, ordering and review of radiographic studies, pulse oximetry, re-evaluation of patient's condition and review of old charts   Care discussed with: admitting provider       Medications Ordered in ED Medications  albuterol (VENTOLIN HFA) 108 (90 Base) MCG/ACT inhaler 2 puff (has no administration in time range)  ipratropium (ATROVENT) nebulizer solution 1 mg (1 mg Nebulization Given 10/03/22 1949)  methylPREDNISolone sodium succinate (SOLU-MEDROL) 125 mg/2 mL injection 125 mg (125 mg Intravenous Given 10/03/22 1947)  albuterol (PROVENTIL) (2.5 MG/3ML) 0.083% nebulizer solution 10 mg (10 mg Nebulization Given 10/03/22 1950)  sodium chloride 0.9 % bolus 1,000 mL (1,000 mLs Intravenous New Bag/Given 10/03/22 2159)  diltiazem (CARDIZEM) injection 10 mg (10 mg Intravenous Given 10/03/22 2203)    ED Course/ Medical Decision Making/ A&P                           Medical Decision Making Amount and/or Complexity of Data Reviewed Labs: ordered. Radiology:  ordered.  Risk Prescription drug management. Decision regarding hospitalization.   Steven Dudley is here with shortness of breath.  Comorbidities of COPD, CHF.  Differential diagnosis COPD exacerbation, heart failure exacerbation.  Seems less likely to be ACS.  Could be pneumonia or viral process.  Patient hypoxic in the 70s upon arrival walking into triage.  Put on nonrebreather.  Diminished breath sounds, wheezing throughout.  Mildly increased work of breathing.  He is able to hold a conversation fairly easily however.  Patient put on continuous breathing treatments with albuterol and Atrovent.  Given IV Solu-Medrol.  CBC, BMP, troponin, BNP and chest x-ray and COVID and flu test obtained.  Will place on BiPAP.  EKG shows sinus rhythm per my review and interpretation.  No ischemic changes.  Chest x-ray per my review and interpretation shows no evidence of pneumonia or volume overload.  Troponin and BNP are within normal limits.  Have no concern for ACS or heart failure exacerbation.  Overall I suspect that this is a COPD exacerbation.  Given hypoxia will admit for further care.  He is improved following breathing treatments and BiPAP.  Patient positive for RSV.  Blood gas was performed which is actually reassuring as pH is 7.35.  CO2 71 which I think is his baseline.  His mentation is improved.  Appears that he is gone into A-fib with RVR we will start him on diltiazem bolus and infusion.  This chart was dictated using voice recognition software.  Despite best efforts to proofread,  errors can occur which can change the documentation meaning.         Final Clinical Impression(s) / ED Diagnoses Final diagnoses:  COPD exacerbation (HCC)  Acute respiratory failure with hypoxia (HCC)  RSV (acute bronchiolitis due to respiratory syncytial virus)    Rx / DC Orders ED Discharge Orders     None         Virgina Norfolk, DO 10/03/22 2016    Virgina Norfolk, DO 10/03/22 2020     Virgina Norfolk, DO 10/03/22 2035    Virgina Norfolk, DO  10/03/22 2219  

## 2022-10-03 NOTE — ED Triage Notes (Signed)
Patient arrives with wife POV c/o SHOB and cough that started 5 days ago. Pt has hx of COPD, states "he's supposed to wear O2"  but does not. Pt states he went to his PCP on Friday; tested negative for COVID, FLU, and Strep. Pt's O2 Saturday 66% on RA in triage.

## 2022-10-04 DIAGNOSIS — F1010 Alcohol abuse, uncomplicated: Secondary | ICD-10-CM | POA: Diagnosis present

## 2022-10-04 DIAGNOSIS — E785 Hyperlipidemia, unspecified: Secondary | ICD-10-CM | POA: Diagnosis present

## 2022-10-04 DIAGNOSIS — Z7982 Long term (current) use of aspirin: Secondary | ICD-10-CM | POA: Diagnosis not present

## 2022-10-04 DIAGNOSIS — J121 Respiratory syncytial virus pneumonia: Secondary | ICD-10-CM | POA: Insufficient documentation

## 2022-10-04 DIAGNOSIS — I480 Paroxysmal atrial fibrillation: Secondary | ICD-10-CM | POA: Insufficient documentation

## 2022-10-04 DIAGNOSIS — R197 Diarrhea, unspecified: Secondary | ICD-10-CM | POA: Diagnosis not present

## 2022-10-04 DIAGNOSIS — R0602 Shortness of breath: Secondary | ICD-10-CM | POA: Diagnosis present

## 2022-10-04 DIAGNOSIS — J210 Acute bronchiolitis due to respiratory syncytial virus: Secondary | ICD-10-CM | POA: Diagnosis present

## 2022-10-04 DIAGNOSIS — Z23 Encounter for immunization: Secondary | ICD-10-CM | POA: Diagnosis not present

## 2022-10-04 DIAGNOSIS — I5032 Chronic diastolic (congestive) heart failure: Secondary | ICD-10-CM | POA: Insufficient documentation

## 2022-10-04 DIAGNOSIS — J440 Chronic obstructive pulmonary disease with acute lower respiratory infection: Secondary | ICD-10-CM | POA: Diagnosis present

## 2022-10-04 DIAGNOSIS — J441 Chronic obstructive pulmonary disease with (acute) exacerbation: Secondary | ICD-10-CM | POA: Diagnosis present

## 2022-10-04 DIAGNOSIS — I110 Hypertensive heart disease with heart failure: Secondary | ICD-10-CM | POA: Diagnosis present

## 2022-10-04 DIAGNOSIS — F17210 Nicotine dependence, cigarettes, uncomplicated: Secondary | ICD-10-CM | POA: Diagnosis present

## 2022-10-04 DIAGNOSIS — G4733 Obstructive sleep apnea (adult) (pediatric): Secondary | ICD-10-CM | POA: Diagnosis present

## 2022-10-04 DIAGNOSIS — J9622 Acute and chronic respiratory failure with hypercapnia: Secondary | ICD-10-CM

## 2022-10-04 DIAGNOSIS — Z1152 Encounter for screening for COVID-19: Secondary | ICD-10-CM | POA: Diagnosis not present

## 2022-10-04 DIAGNOSIS — J9621 Acute and chronic respiratory failure with hypoxia: Secondary | ICD-10-CM | POA: Diagnosis present

## 2022-10-04 DIAGNOSIS — K219 Gastro-esophageal reflux disease without esophagitis: Secondary | ICD-10-CM | POA: Diagnosis present

## 2022-10-04 DIAGNOSIS — Z7951 Long term (current) use of inhaled steroids: Secondary | ICD-10-CM | POA: Diagnosis not present

## 2022-10-04 DIAGNOSIS — Z79899 Other long term (current) drug therapy: Secondary | ICD-10-CM | POA: Diagnosis not present

## 2022-10-04 DIAGNOSIS — E1165 Type 2 diabetes mellitus with hyperglycemia: Secondary | ICD-10-CM | POA: Diagnosis present

## 2022-10-04 DIAGNOSIS — Z720 Tobacco use: Secondary | ICD-10-CM | POA: Insufficient documentation

## 2022-10-04 DIAGNOSIS — Z886 Allergy status to analgesic agent status: Secondary | ICD-10-CM | POA: Diagnosis not present

## 2022-10-04 DIAGNOSIS — Z7984 Long term (current) use of oral hypoglycemic drugs: Secondary | ICD-10-CM | POA: Diagnosis not present

## 2022-10-04 DIAGNOSIS — I4891 Unspecified atrial fibrillation: Secondary | ICD-10-CM | POA: Diagnosis not present

## 2022-10-04 DIAGNOSIS — Z6841 Body Mass Index (BMI) 40.0 and over, adult: Secondary | ICD-10-CM | POA: Diagnosis not present

## 2022-10-04 LAB — I-STAT VENOUS BLOOD GAS, ED
Acid-Base Excess: 10 mmol/L — ABNORMAL HIGH (ref 0.0–2.0)
Bicarbonate: 39.5 mmol/L — ABNORMAL HIGH (ref 20.0–28.0)
Calcium, Ion: 1.11 mmol/L — ABNORMAL LOW (ref 1.15–1.40)
HCT: 53 % — ABNORMAL HIGH (ref 39.0–52.0)
Hemoglobin: 18 g/dL — ABNORMAL HIGH (ref 13.0–17.0)
O2 Saturation: 63 %
Potassium: 4.9 mmol/L (ref 3.5–5.1)
Sodium: 135 mmol/L (ref 135–145)
TCO2: 42 mmol/L — ABNORMAL HIGH (ref 22–32)
pCO2, Ven: 72.1 mmHg (ref 44–60)
pH, Ven: 7.346 (ref 7.25–7.43)
pO2, Ven: 36 mmHg (ref 32–45)

## 2022-10-04 LAB — GLUCOSE, CAPILLARY
Glucose-Capillary: 277 mg/dL — ABNORMAL HIGH (ref 70–99)
Glucose-Capillary: 233 mg/dL — ABNORMAL HIGH (ref 70–99)

## 2022-10-04 LAB — MRSA NEXT GEN BY PCR, NASAL: MRSA by PCR Next Gen: NOT DETECTED

## 2022-10-04 LAB — HEPARIN LEVEL (UNFRACTIONATED): Heparin Unfractionated: <0.1 IU/mL — ABNORMAL LOW (ref 0.30–0.70)

## 2022-10-04 LAB — HIV ANTIBODY (ROUTINE TESTING W REFLEX): HIV Screen 4th Generation wRfx: NONREACTIVE

## 2022-10-04 MED ORDER — PNEUMOCOCCAL 20-VAL CONJ VACC 0.5 ML IM SUSY
0.5000 mL | PREFILLED_SYRINGE | INTRAMUSCULAR | Status: AC
  Administered 2022-10-05: 0.5 mL via INTRAMUSCULAR
  Filled 2022-10-04: qty 0.5

## 2022-10-04 MED ORDER — ONDANSETRON HCL 4 MG PO TABS: 4.0000 mg | ORAL_TABLET | Freq: Four times a day (QID) | ORAL | Status: DC | PRN

## 2022-10-04 MED ORDER — ONDANSETRON HCL 4 MG/2ML IJ SOLN
4.0000 mg | Freq: Four times a day (QID) | INTRAMUSCULAR | Status: DC | PRN
  Administered 2022-10-07: 4 mg via INTRAVENOUS
  Filled 2022-10-04: qty 2

## 2022-10-04 MED ORDER — IBUPROFEN 200 MG PO TABS
400.0000 mg | ORAL_TABLET | Freq: Four times a day (QID) | ORAL | Status: DC | PRN
  Administered 2022-10-06 – 2022-10-07 (×3): 400 mg via ORAL
  Filled 2022-10-04 (×3): qty 2

## 2022-10-04 MED ORDER — METHYLPREDNISOLONE SODIUM SUCC 40 MG IJ SOLR
40.0000 mg | Freq: Every day | INTRAMUSCULAR | Status: DC
  Administered 2022-10-04 – 2022-10-07 (×4): 40 mg via INTRAVENOUS
  Filled 2022-10-04 (×4): qty 1

## 2022-10-04 MED ORDER — NICOTINE 21 MG/24HR TD PT24
21.0000 mg | MEDICATED_PATCH | Freq: Once | TRANSDERMAL | Status: AC
  Administered 2022-10-04: 21 mg via TRANSDERMAL
  Filled 2022-10-04: qty 1

## 2022-10-04 MED ORDER — SODIUM CHLORIDE 0.9% FLUSH
3.0000 mL | Freq: Two times a day (BID) | INTRAVENOUS | Status: DC
  Administered 2022-10-04 – 2022-10-08 (×8): 3 mL via INTRAVENOUS

## 2022-10-04 MED ORDER — ATORVASTATIN CALCIUM 20 MG PO TABS
20.0000 mg | ORAL_TABLET | Freq: Every day | ORAL | Status: DC
  Administered 2022-10-04 – 2022-10-08 (×5): 20 mg via ORAL
  Filled 2022-10-04 (×5): qty 1

## 2022-10-04 MED ORDER — SODIUM CHLORIDE 0.9% FLUSH: 3.0000 mL | INTRAVENOUS | Status: DC | PRN

## 2022-10-04 MED ORDER — THIAMINE MONONITRATE 100 MG PO TABS
100.0000 mg | ORAL_TABLET | Freq: Every day | ORAL | Status: DC
  Administered 2022-10-05 – 2022-10-08 (×4): 100 mg via ORAL
  Filled 2022-10-04 (×4): qty 1

## 2022-10-04 MED ORDER — LORAZEPAM 1 MG PO TABS: 1.0000 mg | ORAL_TABLET | ORAL | Status: DC | PRN

## 2022-10-04 MED ORDER — ORAL CARE MOUTH RINSE: 15.0000 mL | OROMUCOSAL | Status: DC | PRN

## 2022-10-04 MED ORDER — ORAL CARE MOUTH RINSE
15.0000 mL | OROMUCOSAL | Status: DC
  Administered 2022-10-04 – 2022-10-08 (×7): 15 mL via OROMUCOSAL

## 2022-10-04 MED ORDER — HEPARIN (PORCINE) 25000 UT/250ML-% IV SOLN
1750.0000 [IU]/h | INTRAVENOUS | Status: DC
  Administered 2022-10-04: 1450 [IU]/h via INTRAVENOUS
  Administered 2022-10-05: 1750 [IU]/h via INTRAVENOUS
  Filled 2022-10-04 (×2): qty 250

## 2022-10-04 MED ORDER — MOMETASONE FURO-FORMOTEROL FUM 200-5 MCG/ACT IN AERO
2.0000 | INHALATION_SPRAY | Freq: Two times a day (BID) | RESPIRATORY_TRACT | Status: DC
  Administered 2022-10-04 – 2022-10-08 (×8): 2 via RESPIRATORY_TRACT
  Filled 2022-10-04: qty 8.8

## 2022-10-04 MED ORDER — CHLORHEXIDINE GLUCONATE CLOTH 2 % EX PADS
6.0000 | MEDICATED_PAD | Freq: Every day | CUTANEOUS | Status: DC
  Administered 2022-10-04 – 2022-10-06 (×3): 6 via TOPICAL

## 2022-10-04 MED ORDER — POLYETHYLENE GLYCOL 3350 17 G PO PACK
17.0000 g | PACK | Freq: Every day | ORAL | Status: DC | PRN
  Administered 2022-10-06: 17 g via ORAL
  Filled 2022-10-04 (×2): qty 1

## 2022-10-04 MED ORDER — FOLIC ACID 1 MG PO TABS
1.0000 mg | ORAL_TABLET | Freq: Every day | ORAL | Status: DC
  Administered 2022-10-04 – 2022-10-08 (×5): 1 mg via ORAL
  Filled 2022-10-04 (×5): qty 1

## 2022-10-04 MED ORDER — LORAZEPAM 2 MG/ML IJ SOLN: 1.0000 mg | INTRAMUSCULAR | Status: DC | PRN

## 2022-10-04 MED ORDER — FUROSEMIDE 40 MG PO TABS
40.0000 mg | ORAL_TABLET | Freq: Every day | ORAL | Status: DC
  Administered 2022-10-05 – 2022-10-08 (×4): 40 mg via ORAL
  Filled 2022-10-04 (×4): qty 1

## 2022-10-04 MED ORDER — PANTOPRAZOLE SODIUM 40 MG PO TBEC
40.0000 mg | DELAYED_RELEASE_TABLET | Freq: Every day | ORAL | Status: DC
  Administered 2022-10-04 – 2022-10-08 (×5): 40 mg via ORAL
  Filled 2022-10-04 (×5): qty 1

## 2022-10-04 MED ORDER — HEPARIN BOLUS VIA INFUSION
4000.0000 [IU] | Freq: Once | INTRAVENOUS | Status: AC
  Administered 2022-10-04: 4000 [IU] via INTRAVENOUS
  Filled 2022-10-04: qty 4000

## 2022-10-04 MED ORDER — HEPARIN BOLUS VIA INFUSION
3000.0000 [IU] | Freq: Once | INTRAVENOUS | Status: AC
  Administered 2022-10-05: 3000 [IU] via INTRAVENOUS
  Filled 2022-10-04: qty 3000

## 2022-10-04 MED ORDER — ALBUTEROL SULFATE (2.5 MG/3ML) 0.083% IN NEBU
5.0000 mg | INHALATION_SOLUTION | RESPIRATORY_TRACT | Status: AC
  Administered 2022-10-04: 5 mg via RESPIRATORY_TRACT
  Filled 2022-10-04: qty 6

## 2022-10-04 MED ORDER — THIAMINE HCL 100 MG/ML IJ SOLN
100.0000 mg | Freq: Every day | INTRAMUSCULAR | Status: DC
  Administered 2022-10-04: 100 mg via INTRAVENOUS
  Filled 2022-10-04 (×2): qty 2

## 2022-10-04 MED ORDER — ALBUTEROL SULFATE (2.5 MG/3ML) 0.083% IN NEBU: 2.5000 mg | INHALATION_SOLUTION | RESPIRATORY_TRACT | Status: DC | PRN

## 2022-10-04 MED ORDER — INSULIN ASPART 100 UNIT/ML IJ SOLN
0.0000 [IU] | Freq: Three times a day (TID) | INTRAMUSCULAR | Status: DC
  Administered 2022-10-04: 8 [IU] via SUBCUTANEOUS
  Administered 2022-10-05 (×2): 11 [IU] via SUBCUTANEOUS
  Administered 2022-10-05: 5 [IU] via SUBCUTANEOUS
  Administered 2022-10-06: 8 [IU] via SUBCUTANEOUS

## 2022-10-04 MED ORDER — SODIUM CHLORIDE 0.9% FLUSH
3.0000 mL | Freq: Two times a day (BID) | INTRAVENOUS | Status: DC
  Administered 2022-10-04 – 2022-10-08 (×6): 3 mL via INTRAVENOUS

## 2022-10-04 MED ORDER — ADULT MULTIVITAMIN W/MINERALS CH
1.0000 | ORAL_TABLET | Freq: Every day | ORAL | Status: DC
  Administered 2022-10-04 – 2022-10-08 (×5): 1 via ORAL
  Filled 2022-10-04 (×5): qty 1

## 2022-10-04 MED ORDER — LOSARTAN POTASSIUM 50 MG PO TABS
100.0000 mg | ORAL_TABLET | Freq: Every day | ORAL | Status: DC
  Administered 2022-10-04 – 2022-10-08 (×5): 100 mg via ORAL
  Filled 2022-10-04 (×5): qty 2

## 2022-10-04 MED ORDER — SODIUM CHLORIDE 0.9 % IV SOLN: 250.0000 mL | INTRAVENOUS | Status: DC | PRN

## 2022-10-04 NOTE — H&P (Signed)
History and Physical    Steven Dudley ZOX:[id number] DOB: 08/30/1976 DOA: 10/03/2022  PCP: Macy MisBriscoe, Kim K, MD  Patient coming from: Home   Chief Complaint: SOB   HPI: Steven GoonMatthew Dudley is a 46 y.o. male with medical history significant of COPD, CHF, OSA (noncompliant with CPAP).  He presents with 1 week history of shortness of breath.  Has been around many people, possible sick contacts.  Also with cough.  No chest pain, abdominal pain other than some soreness with cough, no nausea or vomiting.  He admits to drinking a pint of alcohol daily, smokes 2 packs/day.  In the emergency department, he was found to be hypoxic in triage, put on NRB, given solumedrol and breathing treatment, required BiPAP. [facility]. He tested +RSV. He was found to be in A Fib RVR and started on cardizem gtt.   Review of Systems: As per HPI. Otherwise, all other review of systems reviewed and are negative.   Past Medical History:  Diagnosis Date   Acid reflux    Acute respiratory failure (HCC)    Alcohol abuse    Anxiety    Asthma    daily and prn inhalers   CHF (congestive heart failure) (HCC)    Chondromalacia of right patella 09/2014   COPD (chronic obstructive pulmonary disease) (HCC)    Diabetes mellitus without complication (HCC)    TYPE II   Gastritis    History of traumatic brain injury 2006   Hyperglycemia    Hypertension    states under control with med., has been on med. x 1 yr.   Meniscal cyst 09/2014   right knee   Morbid obesity (HCC)    Obesity    Renal disorder    Sleep apnea    Smokers' cough [facility](HCC)     Past Surgical History:  Procedure Laterality Date   APPENDECTOMY     FOREIGN BODY REMOVAL Left 02/27/2008   hand   INCISION AND DRAINAGE ABSCESS Left 03/01/2008   hand   IRRIGATION AND DEBRIDEMENT ABSCESS Left 02/27/2008   hand   KNEE ARTHROSCOPY WITH MEDIAL MENISECTOMY Right 09/25/2014   Procedure: RIGHT KNEE SCOPE CHONDROPLASTY/MEDIAL  MENISCECTOMY;  Surgeon: Loreta Aveaniel F Murphy, MD;  Location: [facility];  Service: Orthopedics;  Laterality: Right;  ANESTHESIA: GENERAL, KNEE BLOCK   TENOSYNOVECTOMY Left 02/27/2008   flexor tenosynovectomy left hand   TONSILLECTOMY     TONSILLECTOMY AND ADENOIDECTOMY     WISDOM TOOTH EXTRACTION       reports that he has been smoking cigarettes. He has a 26.00 pack-year smoking history. He has quit using smokeless tobacco. He reports current alcohol use. He reports current drug use. Drugs: Marijuana and IV.  Allergies  Allergen Reactions   Acetaminophen Other (See Comments)    GI BLEEDING Other reaction(s): Other GI BLEEDING   Shellfish Allergy Nausea And Vomiting, Nausea Only and Other (See Comments)    Headache, heat flashes. Other reaction(s): Other Headache, heat flashes.    Family History  Problem Relation Age of Onset   Stomach cancer Mother    Cancer Mother     Prior to Admission medications   Medication Sig Start Date End Date Taking? Authorizing Provider  albuterol (PROVENTIL) (2.5 MG/3ML) 0.083% nebulizer solution Take 2.5 mg by nebulization every 4 (four) hours as needed for wheezing or shortness of breath. 11/26/19   [provider]  albuterol (VENTOLIN HFA) 108 (90 Base) MCG/ACT inhaler Inhale 2 puffs into the lungs every  6 (six) hours as needed for wheezing or shortness of breath. 02/20/19   Mariel Aloe, MD  amLODipine (NORVASC) 5 MG tablet Take 5 mg by mouth at bedtime. 11/25/19   [provider]  aspirin 81 MG chewable tablet Chew 81 mg by mouth daily.    [provider]  atorvastatin (LIPITOR) 20 MG tablet Take 20 mg by mouth daily.    [provider]  famotidine (PEPCID) 20 MG tablet Take 1 tablet (20 mg total) by mouth 2 (two) times daily. 02/17/19   Charlesetta Shanks, MD  furosemide (LASIX) 20 MG tablet Take 1 tablet (20 mg total) by mouth 2 (two) times daily. Patient taking differently: Take 40 mg by mouth daily.   04/25/18   Reyne Dumas, MD  ipratropium-albuterol (DUONEB) 0.5-2.5 (3) MG/3ML SOLN Take 3 mLs by nebulization every 4 (four) hours as needed (wheezing and shortness of breath). Patient not taking: Reported on 11/29/2019 02/20/19   Mariel Aloe, MD  liraglutide (VICTOZA) 18 MG/3ML SOPN Inject 1.2 mg into the skin daily.  04/17/19   [provider]  losartan (COZAAR) 100 MG tablet Take 100 mg by mouth daily. 10/08/19   [provider]  metFORMIN (GLUCOPHAGE-XR) 500 MG 24 hr tablet Take 2,000 mg by mouth every morning. 10/02/19   [provider]  mometasone-formoterol (DULERA) 200-5 MCG/ACT AERO Inhale 1 puff into the lungs 2 (two) times daily. 02/20/19   Mariel Aloe, MD  omeprazole (PRILOSEC) 20 MG capsule Take 20 mg by mouth daily. 10/02/19   [provider]  sildenafil (REVATIO) 20 MG tablet Take 40-100 mg by mouth as needed for erectile dysfunction. 11/01/19   [provider]    Physical Exam: Vitals:   10/04/22 1130 10/04/22 1145 10/04/22 1152 10/04/22 1321  BP: 137/72 (!) 144/86  (!) 143/109  Pulse: 79 75  81  Resp: (!) 21 20  15   Temp:    98.6 F (37 C)  TempSrc:    Axillary  SpO2: 91% 92% 92% 98%  Weight:    126 kg  Height:    5\' 8"  (1.727 m)    Constitutional: NAD, calm, comfortable Eyes: PERRL, lids and conjunctivae normal ENMT: Mucous membranes are moist. Normal dentition.  Respiratory: Diminished breath sounds bilaterally, on BiPAP mask, no conversational dyspnea, normal respiratory effort Cardiovascular: Regular rate and rhythm, no murmurs. No extremity edema.  Normal sinus rhythm on telemetry Abdomen: Soft, nondistended, nontender to palpation. Bowel sounds positive.  Musculoskeletal: No joint deformity upper and lower extremities. No contractures. Normal muscle tone.  Skin: no rashes, lesions, ulcers on exposed skin  Neurologic: Alert and oriented, speech fluent, CN 2-12 grossly intact. No focal deficits.   Psychiatric:  Normal judgment and insight. Normal mood and affect   Labs on Admission: I have personally reviewed following labs and imaging studies  CBC: Recent Labs  Lab 10/03/22 1937 10/03/22 2142 10/04/22 1014  WBC 10.2  --   --   HGB 16.8 18.4* 18.0*  HCT 52.3* 54.0* 53.0*  MCV 93.9  --   --   PLT 229  --   --    Basic Metabolic Panel: Recent Labs  Lab 10/03/22 1937 10/03/22 2142 10/04/22 1014  NA 138 139 135  K 3.5 3.9 4.9  CL 95*  --   --   CO2 34*  --   --   GLUCOSE 237*  --   --   BUN 18  --   --   CREATININE 1.09  --   --  CALCIUM 8.4*  --   --    GFR: Estimated Creatinine Clearance: 110.6 mL/min (by C-G formula based on SCr of 1.09 mg/dL). Liver Function Tests: No results for input(s): "AST", "ALT", "ALKPHOS", "BILITOT", "PROT", "ALBUMIN" in the last 168 hours. No results for input(s): "LIPASE", "AMYLASE" in the last 168 hours. No results for input(s): "AMMONIA" in the last 168 hours. Coagulation Profile: No results for input(s): "INR", "PROTIME" in the last 168 hours. Cardiac Enzymes: No results for input(s): "CKTOTAL", "CKMB", "CKMBINDEX", "TROPONINI" in the last 168 hours. BNP (last 3 results) No results for input(s): "PROBNP" in the last 8760 hours. HbA1C: No results for input(s): "HGBA1C" in the last 72 hours. CBG: No results for input(s): "GLUCAP" in the last 168 hours. Lipid Profile: No results for input(s): "CHOL", "HDL", "LDLCALC", "TRIG", "CHOLHDL", "LDLDIRECT" in the last 72 hours. Thyroid Function Tests: No results for input(s): "TSH", "T4TOTAL", "FREET4", "T3FREE", "THYROIDAB" in the last 72 hours. Anemia Panel: No results for input(s): "VITAMINB12", "FOLATE", "FERRITIN", "TIBC", "IRON", "RETICCTPCT" in the last 72 hours. Urine analysis:    Component Value Date/Time   COLORURINE YELLOW 11/29/2019 0820   APPEARANCEUR HAZY (A) 11/29/2019 0820   LABSPEC >1.030 (H) 11/29/2019 0820   PHURINE 6.0 11/29/2019 0820   GLUCOSEU >=500 (A) 11/29/2019 0820    HGBUR SMALL (A) 11/29/2019 0820   BILIRUBINUR NEGATIVE 11/29/2019 0820   KETONESUR NEGATIVE 11/29/2019 0820   PROTEINUR 100 (A) 11/29/2019 0820   UROBILINOGEN 0.2 08/11/2013 1315   NITRITE NEGATIVE 11/29/2019 0820   LEUKOCYTESUR NEGATIVE 11/29/2019 0820   Sepsis Labs: !!!!!!!!!!!!!!!!!!!!!!!!!!!!!!!!!!!!!!!!!!!! @LABRCNTIP (procalcitonin:4,lacticidven:4) ) Recent Results (from the past 240 hour(s))  Resp panel by RT-PCR (RSV, Flu A&B, Covid) Anterior Nasal Swab     Status: Abnormal   Collection Time: 10/03/22  7:12 PM   Specimen: Anterior Nasal Swab  Result Value Ref Range Status   SARS Coronavirus 2 by RT PCR NEGATIVE NEGATIVE Final    Comment: (NOTE) SARS-CoV-2 target nucleic acids are NOT DETECTED.  The SARS-CoV-2 RNA is generally detectable in upper respiratory specimens during the acute phase of infection. The lowest concentration of SARS-CoV-2 viral copies this assay can detect is 138 copies/mL. A negative result does not preclude SARS-Cov-2 infection and should not be used as the sole basis for treatment or other patient management decisions. A negative result may occur with  improper specimen collection/handling, submission of specimen other than nasopharyngeal swab, presence of viral mutation(s) within the areas targeted by this assay, and inadequate number of viral copies(<138 copies/mL). A negative result must be combined with clinical observations, patient history, and epidemiological information. The expected result is Negative.  Fact Sheet for Patients:  EntrepreneurPulse.com.au  Fact Sheet for Healthcare Providers:  IncredibleEmployment.be  This test is no t yet approved or cleared by the [place] FDA and  has been authorized for detection and/or diagnosis of SARS-CoV-2 by FDA under an Emergency Use Authorization (EUA). This EUA will remain  in effect (meaning this test can be used) for the duration of the COVID-19  declaration under Section 564(b)(1) of the Act, 21 U.S.C.section 360bbb-3(b)(1), unless the authorization is terminated  or revoked sooner.       Influenza A by PCR NEGATIVE NEGATIVE Final   Influenza B by PCR NEGATIVE NEGATIVE Final    Comment: (NOTE) The Xpert Xpress SARS-CoV-2/FLU/RSV plus assay is intended as an aid in the diagnosis of influenza from Nasopharyngeal swab specimens and should not be used as a sole basis for treatment. Nasal washings and aspirates are  unacceptable for Xpert Xpress SARS-CoV-2/FLU/RSV testing.  Fact Sheet for Patients: EntrepreneurPulse.com.au  Fact Sheet for Healthcare Providers: IncredibleEmployment.be  This test is not yet approved or cleared by the [place] FDA and has been authorized for detection and/or diagnosis of SARS-CoV-2 by FDA under an Emergency Use Authorization (EUA). This EUA will remain in effect (meaning this test can be used) for the duration of the COVID-19 declaration under Section 564(b)(1) of the Act, 21 U.S.C. section 360bbb-3(b)(1), unless the authorization is terminated or revoked.     Resp Syncytial Virus by PCR POSITIVE (A) NEGATIVE Final    Comment: (NOTE) Fact Sheet for Patients: EntrepreneurPulse.com.au  Fact Sheet for Healthcare Providers: IncredibleEmployment.be  This test is not yet approved or cleared by the [place] FDA and has been authorized for detection and/or diagnosis of SARS-CoV-2 by FDA under an Emergency Use Authorization (EUA). This EUA will remain in effect (meaning this test can be used) for the duration of the COVID-19 declaration under Section 564(b)(1) of the Act, 21 U.S.C. section 360bbb-3(b)(1), unless the authorization is terminated or revoked.  Performed at [facility], [address] [place], [place] [address]      Radiological Exams on Admission: DG Chest Portable 1 View  Result  Date: 10/03/2022 CLINICAL DATA:  Shortness of breath. EXAM: PORTABLE CHEST 1 VIEW COMPARISON:  Chest radiograph dated 11/29/2019. FINDINGS: Faint bibasilar densities, likely atelectasis. No focal consolidation, pleural effusion, pneumothorax. Stable cardiac silhouette. No acute osseous pathology. IMPRESSION: No active disease. Electronically Signed   By: Anner Crete M.D.   On: 10/03/2022 19:54    EKG: Independently reviewed.  A-fib RVR, rate 135  Assessment/Plan Principal Problem:   Acute on chronic respiratory failure with hypoxia and hypercapnia (HCC) Active Problems:   Hypertension   Diabetes (HCC)   Diabetes mellitus type 2 in obese (HCC)   COPD with acute exacerbation (HCC)   Paroxysmal atrial fibrillation with RVR (HCC)   RSV (respiratory syncytial virus pneumonia)   Chronic diastolic CHF (congestive heart failure) (HCC)   Alcohol abuse   Tobacco abuse   Acute on chronic hypoxemic and hypercapnic respiratory failure -Was found to be 72% on room air in the emergency department, required high flow nasal cannula O2, nonrebreather and eventually BiPAP -No longer has oxygen at home, does not wear, also noncompliant with CPAP nightly for OSA -Chest x-ray negative for active disease -Wean oxygen as able to maintain saturation 88 to 92%  COPD exacerbation with + RSV -Supportive care, Solu-Medrol, breathing treatments  A-fib RVR -IV Cardizem drip -Echocardiogram -CHA2DS2-VASc score 2.  Start IV heparin. Discussed risk of stroke  -Converted to NSR   Hypertension -Cozaar  Hyperlipidemia -Lipitor  Chronic diastolic heart failure -Lasix -Repeat echocardiogram  Diabetes mellitus -Ha1c ordered -Metformin, victoza on hold -Sliding scale insulin  GERD -PPI  Alcohol use -CIWA protocol  Tobacco abuse -Nicotine patch  DVT prophylaxis: IV heparin   Code Status: Full, confirmed w patient at time of admission  Family Communication: None at bedside  Disposition  Plan: Home Consults called: None     Status is: Inpatient Remains inpatient appropriate because: Respiratory failure    Severity of Illness: The appropriate patient status for this patient is INPATIENT. Inpatient status is judged to be reasonable and necessary in order to provide the required intensity of service to ensure the patient's safety. The patient's presenting symptoms, physical exam findings, and initial radiographic and laboratory data in the context of their chronic comorbidities is felt to place  them at high risk for further clinical deterioration. Furthermore, it is not anticipated that the patient will be medically stable for discharge from the hospital within 2 midnights of admission.   * I certify that at the point of admission it is my clinical judgment that the patient will require inpatient hospital care spanning beyond 2 midnights from the point of admission due to high intensity of service, high risk for further deterioration and high frequency of surveillance required.Noralee Stain, DO [facility] 10/04/2022, 1:58 PM   Available via Epic secure chat 7am-7pm After these hours, please refer to coverage provider listed on [email or web address]

## 2022-10-04 NOTE — ED Notes (Signed)
Stood at bedside to void.  SpO2 with good waveform decreased to 87% on BiPAP.  Returned to bed SpO2, HHN given SpO2 now 97%.

## 2022-10-04 NOTE — ED Provider Notes (Incomplete)
Accepted by Dr Rubin Payor

## 2022-10-04 NOTE — ED Notes (Signed)
Report given to CL, Angelia Cox Charity fundraiser.

## 2022-10-04 NOTE — Progress Notes (Signed)
Pt seen, found off bipap on PRB, HR85, RR19, spo2 94%.  No increased wob noted.  Pt stated he does not want to go back on bipap at this time but will later at bedtime.  Pt given scheduled nebulizer treatment which he tolerated well.  Bipap in room on standby.  This Clinical research associate will return later tonight to assist pt with bipap when ready.

## 2022-10-04 NOTE — Progress Notes (Signed)
ANTICOAGULATION CONSULT NOTE - follow up  Pharmacy Consult for heparin  Indication: atrial fibrillation  Allergies  Allergen Reactions   Acetaminophen Other (See Comments)    GI BLEEDING   Shellfish Allergy Nausea And Vomiting, Nausea Only and Other (See Comments)    Headaches and hot flashes, also    Patient Measurements: Height: 5\' 8"  (172.7 cm) Weight: 126 kg (277 lb 12.5 oz) IBW/kg (Calculated) : 68.4 Heparin Dosing Weight: 97.6 kg  Vital Signs: Temp: 98.7 F (37.1 C) (12/26 2010) Temp Source: Oral (12/26 2010) BP: 127/83 (12/26 2330) Pulse Rate: 77 (12/26 2330)  Labs: Recent Labs    10/03/22 1937 10/03/22 2137 10/03/22 2142 10/04/22 1014 10/04/22 2241  HGB 16.8  --  18.4* 18.0*  --   HCT 52.3*  --  54.0* 53.0*  --   PLT 229  --   --   --   --   HEPARINUNFRC  --   --   --   --  <0.10*  CREATININE 1.09  --   --   --   --   TROPONINIHS 14 14  --   --   --      Estimated Creatinine Clearance: 110.6 mL/min (by C-G formula based on SCr of 1.09 mg/dL).   Medical History: Past Medical History:  Diagnosis Date   Acid reflux    Acute respiratory failure (HCC)    Alcohol abuse    Anxiety    Asthma    daily and prn inhalers   CHF (congestive heart failure) (HCC)    Chondromalacia of right patella 09/2014   COPD (chronic obstructive pulmonary disease) (HCC)    Diabetes mellitus without complication (HCC)    TYPE II   Gastritis    History of traumatic brain injury 2006   Hyperglycemia    Hypertension    states under control with med., has been on med. x 1 yr.   Meniscal cyst 09/2014   right knee   Morbid obesity (HCC)    Obesity    Renal disorder    Sleep apnea    Smokers' cough (HCC)     Medications:  Medications Prior to Admission  Medication Sig Dispense Refill Last Dose   albuterol (PROVENTIL) (2.5 MG/3ML) 0.083% nebulizer solution Take 2.5 mg by nebulization every 4 (four) hours as needed for wheezing or shortness of breath.   10/03/2022    albuterol (VENTOLIN HFA) 108 (90 Base) MCG/ACT inhaler Inhale 2 puffs into the lungs every 6 (six) hours as needed for wheezing or shortness of breath. 1 Inhaler 1 10/03/2022   amLODipine (NORVASC) 5 MG tablet Take 5 mg by mouth daily.   10/03/2022   aspirin 81 MG chewable tablet Chew 81 mg by mouth daily.   Past Month   atorvastatin (LIPITOR) 20 MG tablet Take 20 mg by mouth daily.   10/03/2022   benzonatate (TESSALON) 100 MG capsule Take 100 mg by mouth 3 (three) times daily as needed for cough.   unk   cyclobenzaprine (FLEXERIL) 10 MG tablet Take 10 mg by mouth 2 (two) times daily as needed for muscle spasms.   Past Week   famotidine (PEPCID) 20 MG tablet Take 1 tablet (20 mg total) by mouth 2 (two) times daily. (Patient taking differently: Take 20 mg by mouth daily before breakfast.) 30 tablet 2 10/03/2022 at am   fluticasone (FLONASE) 50 MCG/ACT nasal spray Place 1-2 sprays into both nostrils daily as needed for rhinitis or allergies.   unk  fluticasone-salmeterol (ADVAIR HFA) 230-21 MCG/ACT inhaler Inhale 2 puffs into the lungs 2 (two) times daily.   10/03/2022   furosemide (LASIX) 20 MG tablet Take 1 tablet (20 mg total) by mouth 2 (two) times daily. (Patient taking differently: Take 40 mg by mouth in the morning.) 60 tablet 1 10/01/2022 at am   losartan (COZAAR) 100 MG tablet Take 100 mg by mouth daily.   10/03/2022 at am   meloxicam (MOBIC) 15 MG tablet Take 15 mg by mouth in the morning.   10/03/2022 at am   omeprazole (PRILOSEC) 20 MG capsule Take 20 mg by mouth daily before breakfast.   10/03/2022 at am   OZEMPIC, 1 MG/DOSE, 4 MG/3ML SOPN Inject 1 mg into the skin every Sunday.   10/01/2022   predniSONE (DELTASONE) 20 MG tablet Take 20 mg by mouth daily with breakfast.   10/02/2022   ipratropium-albuterol (DUONEB) 0.5-2.5 (3) MG/3ML SOLN Take 3 mLs by nebulization every 4 (four) hours as needed (wheezing and shortness of breath). (Patient not taking: Reported on 10/04/2022) 360 mL 0 Not  Taking   mometasone-formoterol (DULERA) 200-5 MCG/ACT AERO Inhale 1 puff into the lungs 2 (two) times daily. (Patient not taking: Reported on 10/04/2022) 1 Inhaler 0 Not Taking   sildenafil (REVATIO) 20 MG tablet Take 40-100 mg by mouth as needed for erectile dysfunction. (Patient not taking: Reported on 10/04/2022)   Not Taking    Assessment: 46 yo M with new onset Afib.  Pharmacy consulted to dose heparin.  No anticoagulant PTA.  CBC WNL, SCr 1.09  HL <0.1 subtherapeutic on 1450 units/hr Per RN no interruptions and no bleeding  Goal of Therapy:  Heparin level 0.3-0.7 units/ml Monitor platelets by anticoagulation protocol: Yes   Plan:  Heparin 3000 units IV x 1  Increase heparin drip to 1750 units/hr Check 8 hour heparin level Daily CBC & heparin level while on heparin drip  Arley Phenix RPh 10/04/2022, 11:46 PM

## 2022-10-04 NOTE — ED Notes (Signed)
Wife was contacted and updated and given his [facility] room number.

## 2022-10-04 NOTE — ED Notes (Signed)
Spoke with wife on the phone about history and current condition. Need to call back to update her when CL is on scene to transfer pt to IP.

## 2022-10-04 NOTE — Progress Notes (Signed)
ANTICOAGULATION CONSULT NOTE - Initial Consult  Pharmacy Consult for heparin  Indication: atrial fibrillation  Allergies  Allergen Reactions   Acetaminophen Other (See Comments)    GI BLEEDING Other reaction(s): Other GI BLEEDING   Shellfish Allergy Nausea And Vomiting, Nausea Only and Other (See Comments)    Headache, heat flashes. Other reaction(s): Other Headache, heat flashes.    Patient Measurements: Height: 5\' 8"  (172.7 cm) Weight: 126 kg (277 lb 12.5 oz) IBW/kg (Calculated) : 68.4 Heparin Dosing Weight: 97.6 kg  Vital Signs: Temp: 98.6 F (37 C) (12/26 1321) Temp Source: Axillary (12/26 1321) BP: 143/109 (12/26 1321) Pulse Rate: 81 (12/26 1321)  Labs: Recent Labs    10/03/22 1937 10/03/22 2137 10/03/22 2142 10/04/22 1014  HGB 16.8  --  18.4* 18.0*  HCT 52.3*  --  54.0* 53.0*  PLT 229  --   --   --   CREATININE 1.09  --   --   --   TROPONINIHS 14 14  --   --     Estimated Creatinine Clearance: 110.6 mL/min (by C-G formula based on SCr of 1.09 mg/dL).   Medical History: Past Medical History:  Diagnosis Date   Acid reflux    Acute respiratory failure (HCC)    Alcohol abuse    Anxiety    Asthma    daily and prn inhalers   CHF (congestive heart failure) (HCC)    Chondromalacia of right patella 09/2014   COPD (chronic obstructive pulmonary disease) (HCC)    Diabetes mellitus without complication (HCC)    TYPE II   Gastritis    History of traumatic brain injury 2006   Hyperglycemia    Hypertension    states under control with med., has been on med. x 1 yr.   Meniscal cyst 09/2014   right knee   Morbid obesity (HCC)    Obesity    Renal disorder    Sleep apnea    Smokers' cough (HCC)     Medications:  Medications Prior to Admission  Medication Sig Dispense Refill Last Dose   albuterol (PROVENTIL) (2.5 MG/3ML) 0.083% nebulizer solution Take 2.5 mg by nebulization every 4 (four) hours as needed for wheezing or shortness of breath.       albuterol (VENTOLIN HFA) 108 (90 Base) MCG/ACT inhaler Inhale 2 puffs into the lungs every 6 (six) hours as needed for wheezing or shortness of breath. 1 Inhaler 1    amLODipine (NORVASC) 5 MG tablet Take 5 mg by mouth at bedtime.      aspirin 81 MG chewable tablet Chew 81 mg by mouth daily.      atorvastatin (LIPITOR) 20 MG tablet Take 20 mg by mouth daily.      famotidine (PEPCID) 20 MG tablet Take 1 tablet (20 mg total) by mouth 2 (two) times daily. 30 tablet 2    furosemide (LASIX) 20 MG tablet Take 1 tablet (20 mg total) by mouth 2 (two) times daily. (Patient taking differently: Take 40 mg by mouth daily. ) 60 tablet 1    ipratropium-albuterol (DUONEB) 0.5-2.5 (3) MG/3ML SOLN Take 3 mLs by nebulization every 4 (four) hours as needed (wheezing and shortness of breath). (Patient not taking: Reported on 11/29/2019) 360 mL 0    liraglutide (VICTOZA) 18 MG/3ML SOPN Inject 1.2 mg into the skin daily.       losartan (COZAAR) 100 MG tablet Take 100 mg by mouth daily.      metFORMIN (GLUCOPHAGE-XR) 500 MG 24 hr tablet Take  2,000 mg by mouth every morning.      mometasone-formoterol (DULERA) 200-5 MCG/ACT AERO Inhale 1 puff into the lungs 2 (two) times daily. 1 Inhaler 0    omeprazole (PRILOSEC) 20 MG capsule Take 20 mg by mouth daily.      sildenafil (REVATIO) 20 MG tablet Take 40-100 mg by mouth as needed for erectile dysfunction.       Assessment: 46 yo M with new onset Afib.  Pharmacy consulted to dose heparin.  No anticoagulant PTA.  CBC WNL, SCr 1.09 No bleeding reported.  Goal of Therapy:  Heparin level 0.3-0.7 units/ml Monitor platelets by anticoagulation protocol: Yes   Plan:  Heparin 4000 units IV x 1  Heparin drip 1450 units/hr Check 6 hour heparin level Daily CBC & heparin level while on heparin drip  Eudelia Bunch, Pharm.D Use secure chat for questions 10/04/2022 2:01 PM

## 2022-10-05 ENCOUNTER — Inpatient Hospital Stay (HOSPITAL_COMMUNITY): Payer: Medicaid Other

## 2022-10-05 DIAGNOSIS — I4891 Unspecified atrial fibrillation: Secondary | ICD-10-CM | POA: Diagnosis not present

## 2022-10-05 DIAGNOSIS — J9622 Acute and chronic respiratory failure with hypercapnia: Secondary | ICD-10-CM | POA: Diagnosis not present

## 2022-10-05 DIAGNOSIS — J9621 Acute and chronic respiratory failure with hypoxia: Secondary | ICD-10-CM | POA: Diagnosis not present

## 2022-10-05 LAB — BASIC METABOLIC PANEL
Anion gap: 9 (ref 5–15)
BUN: 20 mg/dL (ref 6–20)
CO2: 33 mmol/L — ABNORMAL HIGH (ref 22–32)
Calcium: 8.5 mg/dL — ABNORMAL LOW (ref 8.9–10.3)
Chloride: 93 mmol/L — ABNORMAL LOW (ref 98–111)
Creatinine, Ser: 0.88 mg/dL (ref 0.61–1.24)
GFR, Estimated: >60 mL/min (ref >60)
Glucose, Bld: 309 mg/dL — ABNORMAL HIGH (ref 70–99)
Potassium: 4.6 mmol/L (ref 3.5–5.1)
Sodium: 135 mmol/L (ref 135–145)

## 2022-10-05 LAB — ECHOCARDIOGRAM COMPLETE
Area-P 1/2: 3.75 cm2
Calc EF: 44.7 %
Height: 68 in
S' Lateral: 4.6 cm
Single Plane A2C EF: 50.9 %
Single Plane A4C EF: 37.2 %
Weight: 4444.47 oz

## 2022-10-05 LAB — GLUCOSE, CAPILLARY
Glucose-Capillary: 328 mg/dL — ABNORMAL HIGH (ref 70–99)
Glucose-Capillary: 218 mg/dL — ABNORMAL HIGH (ref 70–99)
Glucose-Capillary: 304 mg/dL — ABNORMAL HIGH (ref 70–99)
Glucose-Capillary: 324 mg/dL — ABNORMAL HIGH (ref 70–99)

## 2022-10-05 LAB — CBC
HCT: 51.7 % (ref 39.0–52.0)
Hemoglobin: 15.9 g/dL (ref 13.0–17.0)
MCH: 29.8 pg (ref 26.0–34.0)
MCHC: 30.8 g/dL (ref 30.0–36.0)
MCV: 96.8 fL (ref 80.0–100.0)
Platelets: 227 10*3/uL (ref 150–400)
RBC: 5.34 MIL/uL (ref 4.22–5.81)
RDW: 14.3 % (ref 11.5–15.5)
WBC: 13.1 10*3/uL — ABNORMAL HIGH (ref 4.0–10.5)
nRBC: 0 % (ref 0.0–0.2)

## 2022-10-05 LAB — CBG MONITORING, ED: Glucose-Capillary: 381 mg/dL — ABNORMAL HIGH (ref 70–99)

## 2022-10-05 LAB — HEMOGLOBIN A1C
Hgb A1c MFr Bld: 8.9 % — ABNORMAL HIGH (ref 4.8–5.6)
Mean Plasma Glucose: 209 mg/dL

## 2022-10-05 LAB — HEPARIN LEVEL (UNFRACTIONATED): Heparin Unfractionated: 0.4 IU/mL (ref 0.30–0.70)

## 2022-10-05 MED ORDER — APIXABAN 5 MG PO TABS
5.0000 mg | ORAL_TABLET | Freq: Two times a day (BID) | ORAL | Status: DC
  Administered 2022-10-05 – 2022-10-08 (×7): 5 mg via ORAL
  Filled 2022-10-05 (×7): qty 1

## 2022-10-05 MED ORDER — SIMETHICONE 80 MG PO CHEW
80.0000 mg | CHEWABLE_TABLET | Freq: Four times a day (QID) | ORAL | Status: DC | PRN
  Administered 2022-10-05 – 2022-10-06 (×2): 80 mg via ORAL
  Filled 2022-10-05 (×2): qty 1

## 2022-10-05 MED ORDER — GUAIFENESIN ER 600 MG PO TB12
600.0000 mg | ORAL_TABLET | Freq: Two times a day (BID) | ORAL | Status: DC | PRN
  Administered 2022-10-06: 600 mg via ORAL
  Filled 2022-10-05: qty 1

## 2022-10-05 MED ORDER — INSULIN ASPART 100 UNIT/ML IJ SOLN
4.0000 [IU] | Freq: Once | INTRAMUSCULAR | Status: AC
  Administered 2022-10-05: 4 [IU] via SUBCUTANEOUS

## 2022-10-05 MED ORDER — INSULIN ASPART 100 UNIT/ML IJ SOLN
6.0000 [IU] | Freq: Three times a day (TID) | INTRAMUSCULAR | Status: DC
  Administered 2022-10-05 – 2022-10-06 (×2): 6 [IU] via SUBCUTANEOUS

## 2022-10-05 MED ORDER — PERFLUTREN LIPID MICROSPHERE
1.0000 mL | INTRAVENOUS | Status: AC | PRN
  Administered 2022-10-05: 2 mL via INTRAVENOUS

## 2022-10-05 MED ORDER — NICOTINE 21 MG/24HR TD PT24
21.0000 mg | MEDICATED_PATCH | Freq: Every day | TRANSDERMAL | Status: DC
  Administered 2022-10-05 – 2022-10-08 (×4): 21 mg via TRANSDERMAL
  Filled 2022-10-05 (×4): qty 1

## 2022-10-05 MED ORDER — INSULIN GLARGINE-YFGN 100 UNIT/ML ~~LOC~~ SOLN
20.0000 [IU] | Freq: Every day | SUBCUTANEOUS | Status: DC
  Administered 2022-10-05: 20 [IU] via SUBCUTANEOUS
  Filled 2022-10-05 (×2): qty 0.2

## 2022-10-05 MED ORDER — DILTIAZEM HCL ER COATED BEADS 120 MG PO CP24
120.0000 mg | ORAL_CAPSULE | Freq: Every day | ORAL | Status: DC
  Administered 2022-10-05 – 2022-10-08 (×4): 120 mg via ORAL
  Filled 2022-10-05 (×4): qty 1

## 2022-10-05 NOTE — Discharge Instructions (Signed)
Information on my medicine - ELIQUIS (apixaban)  This medication education was reviewed with me or my healthcare representative as part of my discharge preparation.   Why was Eliquis prescribed for you? Eliquis was prescribed for you to reduce the risk of a blood clot forming that can cause a stroke if you have a medical condition called atrial fibrillation (a type of irregular heartbeat).  What do You need to know about Eliquis ? Take your Eliquis TWICE DAILY - one tablet in the morning and one tablet in the evening with or without food. If you have difficulty swallowing the tablet whole please discuss with your pharmacist how to take the medication safely.  Take Eliquis exactly as prescribed by your doctor and DO NOT stop taking Eliquis without talking to the doctor who prescribed the medication.  Stopping may increase your risk of developing a stroke.  Refill your prescription before you run out.  After discharge, you should have regular check-up appointments with your healthcare provider that is prescribing your Eliquis.  In the future your dose may need to be changed if your kidney function or weight changes by a significant amount or as you get older.  What do you do if you miss a dose? If you miss a dose, take it as soon as you remember on the same day and resume taking twice daily.  Do not take more than one dose of ELIQUIS at the same time to make up a missed dose.  Important Safety Information A possible side effect of Eliquis is bleeding. You should call your healthcare provider right away if you experience any of the following: Bleeding from an injury or your nose that does not stop. Unusual colored urine (red or dark brown) or unusual colored stools (red or black). Unusual bruising for unknown reasons. A serious fall or if you hit your head (even if there is no bleeding).  Some medicines may interact with Eliquis and might increase your risk of bleeding or clotting while  on Eliquis. To help avoid this, consult your healthcare provider or pharmacist prior to using any new prescription or non-prescription medications, including herbals, vitamins, non-steroidal anti-inflammatory drugs (NSAIDs) and supplements.  This website has more information on Eliquis (apixaban): http://www.eliquis.com/eliquis/homeInformation on my medicine - ELIQUIS (apixaban)  This medication education was reviewed with me or my healthcare representative as part of my discharge preparation.  The pharmacist that spoke with me during my hospital stay was:  Herby AbrahamBell, Tenicia Gural T, [facility]  Why was Eliquis prescribed for you? Eliquis was prescribed for you to reduce the risk of a blood clot forming that can cause a stroke if you have a medical condition called atrial fibrillation (a type of irregular heartbeat).  What do You need to know about Eliquis ? Take your Eliquis TWICE DAILY - one tablet in the morning and one tablet in the evening with or without food. If you have difficulty swallowing the tablet whole please discuss with your pharmacist how to take the medication safely.  Take Eliquis exactly as prescribed by your doctor and DO NOT stop taking Eliquis without talking to the doctor who prescribed the medication.  Stopping may increase your risk of developing a stroke.  Refill your prescription before you run out.  After discharge, you should have regular check-up appointments with your healthcare provider that is prescribing your Eliquis.  In the future your dose may need to be changed if your kidney function or weight changes by a significant amount or as you  get older.  What do you do if you miss a dose? If you miss a dose, take it as soon as you remember on the same day and resume taking twice daily.  Do not take more than one dose of ELIQUIS at the same time to make up a missed dose.  Important Safety Information A possible side effect of Eliquis is bleeding. You should call your  healthcare provider right away if you experience any of the following: Bleeding from an injury or your nose that does not stop. Unusual colored urine (red or dark brown) or unusual colored stools (red or black). Unusual bruising for unknown reasons. A serious fall or if you hit your head (even if there is no bleeding).  Some medicines may interact with Eliquis and might increase your risk of bleeding or clotting while on Eliquis. To help avoid this, consult your healthcare provider or pharmacist prior to using any new prescription or non-prescription medications, including herbals, vitamins, non-steroidal anti-inflammatory drugs (NSAIDs) and supplements.  This website has more information on Eliquis (apixaban): http://www.eliquis.com/eliquis/home

## 2022-10-05 NOTE — Progress Notes (Signed)
  Echocardiogram 2D Echocardiogram has been performed.  Steven Dudley 10/05/2022, 2:28 PM

## 2022-10-05 NOTE — Progress Notes (Signed)
PROGRESS NOTE    Steven Dudley  [id number] DOB: 05/04/1976 DOA: 10/03/2022 PCP: Katherina Mires, MD     Brief Narrative:  Curties Sassaman is a 46 y.o. male with medical history significant of COPD, CHF, OSA (noncompliant with CPAP).  He presents with 1 week history of shortness of breath.  Has been around many people, possible sick contacts.  Also with cough.  No chest pain, abdominal pain other than some soreness with cough, no nausea or vomiting.  He admits to drinking a pint of alcohol daily, smokes 2 packs/day.   In the emergency department, he was found to be hypoxic in triage, put on NRB, given solumedrol and breathing treatment, required BiPAP. Transferred to [facility] for respiratory failure. He tested +RSV. He was found to be in A Fib RVR and started on cardizem gtt and IV heparin.   New events last 24 hours / Subjective: Doing well on BiPAP this morning. Admits to cough. Asking for nicotine patch   Assessment & Plan:   Principal Problem:   Acute on chronic respiratory failure with hypoxia and hypercapnia (HCC) Active Problems:   Hypertension   Diabetes (HCC)   Diabetes mellitus type 2 in obese (HCC)   COPD with acute exacerbation (HCC)   Paroxysmal atrial fibrillation with RVR (HCC)   RSV (respiratory syncytial virus pneumonia)   Chronic diastolic CHF (congestive heart failure) (HCC)   Alcohol abuse   Tobacco abuse   Acute on chronic hypoxemic and hypercapnic respiratory failure -Was found to be 72% on room air in the emergency department, required high flow nasal cannula O2, nonrebreather and eventually BiPAP -No longer has oxygen at home, does not wear, also noncompliant with CPAP nightly for OSA -Chest x-ray negative for active disease -Wean oxygen as able to maintain saturation 88 to 92%   COPD exacerbation with + RSV -Supportive care, Solu-Medrol, breathing treatments   A-fib RVR -IV Cardizem drip --> PO -Echocardiogram pending -CHA2DS2-VASc score 2.  IV  heparin --> Eliquis  -Converted to NSR    Hypertension -Cozaar   Hyperlipidemia -Lipitor   Chronic diastolic heart failure -Lasix -Echocardiogram pending   Diabetes mellitus with hyperglycemia  -Ha1c 8.9  -Metformin, victoza on hold -Sliding scale insulin. Add semglee    GERD -PPI   Alcohol use -CIWA protocol   Tobacco abuse -Nicotine patch   DVT prophylaxis:  apixaban (ELIQUIS) tablet 5 mg  Code Status: Full Family Communication: None at bedside Disposition Plan:  Status is: Inpatient Remains inpatient appropriate because: respiratory failure  Consultants:  None  Procedures:  None   Antimicrobials:  Anti-infectives (From admission, onward)    None        Objective: Vitals:   10/05/22 0816 10/05/22 0900 10/05/22 1000 10/05/22 1002  BP:  (!) 187/118 (!) 182/79 (!) 187/118  Pulse:  97 81   Resp:  16 18   Temp:      TempSrc:      SpO2: 96% (!) 82% 96%   Weight:      Height:        Intake/Output Summary (Last 24 hours) at 10/05/2022 1047 Last data filed at 10/05/2022 1011 Gross per 24 hour  Intake 1467.73 ml  Output 850 ml  Net 617.73 ml   Filed Weights   10/03/22 1922 10/04/22 1321  Weight: 129.3 kg 126 kg    Examination:  General exam: Appears calm and comfortable  Respiratory system: Diminished breath sounds bilaterally, on BiPAP and appears comfortable Cardiovascular system: S1 & S2 heard,  RRR. No murmurs. No pedal edema. Gastrointestinal system: Abdomen is nondistended, soft and nontender. Normal bowel sounds heard. Central nervous system: Alert and oriented. No focal neurological deficits. Speech clear.  Extremities: Symmetric in appearance  Skin: No rashes, lesions or ulcers on exposed skin  Psychiatry: Judgement and insight appear normal. Mood & affect appropriate.   Data Reviewed: I have personally reviewed following labs and imaging studies  CBC: Recent Labs  Lab 10/03/22 1937 10/03/22 2142 10/04/22 1014 10/05/22 0502   WBC 10.2  --   --  13.1*  HGB 16.8 18.4* 18.0* 15.9  HCT 52.3* 54.0* 53.0* 51.7  MCV 93.9  --   --  96.8  PLT 229  --   --  227   Basic Metabolic Panel: Recent Labs  Lab 10/03/22 1937 10/03/22 2142 10/04/22 1014 10/05/22 0502  NA 138 139 135 135  K 3.5 3.9 4.9 4.6  CL 95*  --   --  93*  CO2 34*  --   --  33*  GLUCOSE 237*  --   --  309*  BUN 18  --   --  20  CREATININE 1.09  --   --  0.88  CALCIUM 8.4*  --   --  8.5*   GFR: Estimated Creatinine Clearance: 137 mL/min (by C-G formula based on SCr of 0.88 mg/dL). Liver Function Tests: No results for input(s): "AST", "ALT", "ALKPHOS", "BILITOT", "PROT", "ALBUMIN" in the last 168 hours. No results for input(s): "LIPASE", "AMYLASE" in the last 168 hours. No results for input(s): "AMMONIA" in the last 168 hours. Coagulation Profile: No results for input(s): "INR", "PROTIME" in the last 168 hours. Cardiac Enzymes: No results for input(s): "CKTOTAL", "CKMB", "CKMBINDEX", "TROPONINI" in the last 168 hours. BNP (last 3 results) No results for input(s): "PROBNP" in the last 8760 hours. HbA1C: Recent Labs    10/04/22 1414  HGBA1C 8.9*   CBG: Recent Labs  Lab 10/04/22 1157 10/04/22 1724 10/04/22 2123 10/05/22 0810  GLUCAP 381* 277* 233* 328*   Lipid Profile: No results for input(s): "CHOL", "HDL", "LDLCALC", "TRIG", "CHOLHDL", "LDLDIRECT" in the last 72 hours. Thyroid Function Tests: No results for input(s): "TSH", "T4TOTAL", "FREET4", "T3FREE", "THYROIDAB" in the last 72 hours. Anemia Panel: No results for input(s): "VITAMINB12", "FOLATE", "FERRITIN", "TIBC", "IRON", "RETICCTPCT" in the last 72 hours. Sepsis Labs: No results for input(s): "PROCALCITON", "LATICACIDVEN" in the last 168 hours.  Recent Results (from the past 240 hour(s))  Resp panel by RT-PCR (RSV, Flu A&B, Covid) Anterior Nasal Swab     Status: Abnormal   Collection Time: 10/03/22  7:12 PM   Specimen: Anterior Nasal Swab  Result Value Ref Range Status    SARS Coronavirus 2 by RT PCR NEGATIVE NEGATIVE Final    Comment: (NOTE) SARS-CoV-2 target nucleic acids are NOT DETECTED.  The SARS-CoV-2 RNA is generally detectable in upper respiratory specimens during the acute phase of infection. The lowest concentration of SARS-CoV-2 viral copies this assay can detect is 138 copies/mL. A negative result does not preclude SARS-Cov-2 infection and should not be used as the sole basis for treatment or other patient management decisions. A negative result may occur with  improper specimen collection/handling, submission of specimen other than nasopharyngeal swab, presence of viral mutation(s) within the areas targeted by this assay, and inadequate number of viral copies(<138 copies/mL). A negative result must be combined with clinical observations, patient history, and epidemiological information. The expected result is Negative.  Fact Sheet for Patients:  BloggerCourse.com  Fact Sheet  for Healthcare Providers:  IncredibleEmployment.be  This test is no t yet approved or cleared by the [place] and  has been authorized for detection and/or diagnosis of SARS-CoV-2 by FDA under an Emergency Use Authorization (EUA). This EUA will remain  in effect (meaning this test can be used) for the duration of the COVID-19 declaration under Section 564(b)(1) of the Act, 21 U.S.C.section 360bbb-3(b)(1), unless the authorization is terminated  or revoked sooner.       Influenza A by PCR NEGATIVE NEGATIVE Final   Influenza B by PCR NEGATIVE NEGATIVE Final    Comment: (NOTE) The Xpert Xpress SARS-CoV-2/FLU/RSV plus assay is intended as an aid in the diagnosis of influenza from Nasopharyngeal swab specimens and should not be used as a sole basis for treatment. Nasal washings and aspirates are unacceptable for Xpert Xpress SARS-CoV-2/FLU/RSV testing.  Fact Sheet for  Patients: EntrepreneurPulse.com.au  Fact Sheet for Healthcare Providers: IncredibleEmployment.be  This test is not yet approved or cleared by the [place] FDA and has been authorized for detection and/or diagnosis of SARS-CoV-2 by FDA under an Emergency Use Authorization (EUA). This EUA will remain in effect (meaning this test can be used) for the duration of the COVID-19 declaration under Section 564(b)(1) of the Act, 21 U.S.C. section 360bbb-3(b)(1), unless the authorization is terminated or revoked.     Resp Syncytial Virus by PCR POSITIVE (A) NEGATIVE Final    Comment: (NOTE) Fact Sheet for Patients: EntrepreneurPulse.com.au  Fact Sheet for Healthcare Providers: IncredibleEmployment.be  This test is not yet approved or cleared by the [place] FDA and has been authorized for detection and/or diagnosis of SARS-CoV-2 by FDA under an Emergency Use Authorization (EUA). This EUA will remain in effect (meaning this test can be used) for the duration of the COVID-19 declaration under Section 564(b)(1) of the Act, 21 U.S.C. section 360bbb-3(b)(1), unless the authorization is terminated or revoked.  Performed at [facility], [address] [place], [place] [address]   MRSA Next Gen by PCR, Nasal     Status: None   Collection Time: 10/04/22  1:35 PM   Specimen: Nasal Mucosa; Nasal Swab  Result Value Ref Range Status   MRSA by PCR Next Gen NOT DETECTED NOT DETECTED Final    Comment: (NOTE) The GeneXpert MRSA Assay (FDA approved for NASAL specimens only), is one component of a comprehensive MRSA colonization surveillance program. It is not intended to diagnose MRSA infection nor to guide or monitor treatment for MRSA infections. Test performance is not FDA approved in patients less than 78 years old. Performed at [facility], [place] [address] [place], [place]  [address]       Radiology Studies: DG Chest Portable 1 View  Result Date: 10/03/2022 CLINICAL DATA:  Shortness of breath. EXAM: PORTABLE CHEST 1 VIEW COMPARISON:  Chest radiograph dated 11/29/2019. FINDINGS: Faint bibasilar densities, likely atelectasis. No focal consolidation, pleural effusion, pneumothorax. Stable cardiac silhouette. No acute osseous pathology. IMPRESSION: No active disease. Electronically Signed   By: Anner Crete M.D.   On: 10/03/2022 19:54      Scheduled Meds:  apixaban  5 mg Oral BID   atorvastatin  20 mg Oral Daily   Chlorhexidine Gluconate Cloth  6 each Topical Daily   diltiazem  120 mg Oral Daily   folic acid  1 mg Oral Daily   furosemide  40 mg Oral Daily   insulin aspart  0-15 Units Subcutaneous TID WC   insulin glargine-yfgn  20 Units Subcutaneous Daily   ipratropium-albuterol  3 mL Nebulization Q4H   losartan  100 mg Oral Daily   methylPREDNISolone (SOLU-MEDROL) injection  40 mg Intravenous Daily   mometasone-formoterol  2 puff Inhalation BID   multivitamin with minerals  1 tablet Oral Daily   nicotine  21 mg Transdermal Daily   mouth rinse  15 mL Mouth Rinse 4 times per day   pantoprazole  40 mg Oral Daily   pneumococcal 20-valent conjugate vaccine  0.5 mL Intramuscular Tomorrow-1000   sodium chloride flush  3 mL Intravenous Q12H   sodium chloride flush  3 mL Intravenous Q12H   thiamine  100 mg Oral Daily   Or   thiamine  100 mg Intravenous Daily   Continuous Infusions:  sodium chloride 20 mL/hr at 10/05/22 0938   sodium chloride       LOS: 1 day   Time spent: 25 minutes   Dessa Phi, DO [facility] 10/05/2022, 10:47 AM   Available via Epic secure chat 7am-7pm After these hours, please refer to coverage provider listed on [email or web address]

## 2022-10-05 NOTE — Inpatient Diabetes Management (Signed)
Inpatient Diabetes Program Recommendations  AACE/ADA: New Consensus Statement on Inpatient Glycemic Control (2015)  Target Ranges:  Prepandial:   less than 140 mg/dL      Peak postprandial:   less than 180 mg/dL (1-2 hours)      Critically ill patients:  140 - 180 mg/dL   Lab Results  Component Value Date   GLUCAP 328 (H) 10/05/2022   HGBA1C 8.9 (H) 10/04/2022    Review of Glycemic Control  Diabetes history: DM2 Outpatient Diabetes medications: Ozempic 1 mg weekly Current orders for Inpatient glycemic control: Semglee 20 QD, Novolog 0-15 TID On Solumedrol 40 QD  Inpatient Diabetes Program Recommendations:    Consider adding Novolog 6 units TID with meals if eating > 50% while on steroids.  Titrate Semglee if FBS > 180 mg/dL  Will continue to follow glucose trends.  Thank you. Ailene Ards, RD, LDN, CDC[facility] Inpatient Diabetes Coordinator [phone]

## 2022-10-06 DIAGNOSIS — J9621 Acute and chronic respiratory failure with hypoxia: Secondary | ICD-10-CM | POA: Diagnosis not present

## 2022-10-06 DIAGNOSIS — J9622 Acute and chronic respiratory failure with hypercapnia: Secondary | ICD-10-CM | POA: Diagnosis not present

## 2022-10-06 LAB — CBC
HCT: 52.2 % — ABNORMAL HIGH (ref 39.0–52.0)
Hemoglobin: 16.2 g/dL (ref 13.0–17.0)
MCH: 30.2 pg (ref 26.0–34.0)
MCHC: 31 g/dL (ref 30.0–36.0)
MCV: 97.4 fL (ref 80.0–100.0)
Platelets: 212 10*3/uL (ref 150–400)
RBC: 5.36 MIL/uL (ref 4.22–5.81)
RDW: 14.5 % (ref 11.5–15.5)
WBC: 13.8 10*3/uL — ABNORMAL HIGH (ref 4.0–10.5)
nRBC: 0 % (ref 0.0–0.2)

## 2022-10-06 LAB — GLUCOSE, CAPILLARY
Glucose-Capillary: 289 mg/dL — ABNORMAL HIGH (ref 70–99)
Glucose-Capillary: 150 mg/dL — ABNORMAL HIGH (ref 70–99)
Glucose-Capillary: 201 mg/dL — ABNORMAL HIGH (ref 70–99)
Glucose-Capillary: 213 mg/dL — ABNORMAL HIGH (ref 70–99)

## 2022-10-06 LAB — BASIC METABOLIC PANEL
Anion gap: 8 (ref 5–15)
BUN: 21 mg/dL — ABNORMAL HIGH (ref 6–20)
CO2: 40 mmol/L — ABNORMAL HIGH (ref 22–32)
Calcium: 8.7 mg/dL — ABNORMAL LOW (ref 8.9–10.3)
Chloride: 91 mmol/L — ABNORMAL LOW (ref 98–111)
Creatinine, Ser: 0.86 mg/dL (ref 0.61–1.24)
GFR, Estimated: >60 mL/min (ref >60)
Glucose, Bld: 247 mg/dL — ABNORMAL HIGH (ref 70–99)
Potassium: 4 mmol/L (ref 3.5–5.1)
Sodium: 139 mmol/L (ref 135–145)

## 2022-10-06 MED ORDER — INSULIN ASPART 100 UNIT/ML IJ SOLN
0.0000 [IU] | Freq: Three times a day (TID) | INTRAMUSCULAR | Status: DC
  Administered 2022-10-06: 3 [IU] via SUBCUTANEOUS
  Administered 2022-10-06 – 2022-10-07 (×2): 7 [IU] via SUBCUTANEOUS
  Administered 2022-10-07: 15 [IU] via SUBCUTANEOUS
  Administered 2022-10-07: 7 [IU] via SUBCUTANEOUS
  Administered 2022-10-08: 11 [IU] via SUBCUTANEOUS

## 2022-10-06 MED ORDER — IPRATROPIUM-ALBUTEROL 0.5-2.5 (3) MG/3ML IN SOLN
3.0000 mL | Freq: Two times a day (BID) | RESPIRATORY_TRACT | Status: DC
  Administered 2022-10-06 – 2022-10-08 (×4): 3 mL via RESPIRATORY_TRACT
  Filled 2022-10-06 (×4): qty 3

## 2022-10-06 MED ORDER — METHOCARBAMOL 1000 MG/10ML IJ SOLN
500.0000 mg | Freq: Four times a day (QID) | INTRAVENOUS | Status: DC | PRN
  Administered 2022-10-06 – 2022-10-07 (×2): 500 mg via INTRAVENOUS
  Filled 2022-10-06: qty 500
  Filled 2022-10-06: qty 5
  Filled 2022-10-06: qty 500

## 2022-10-06 MED ORDER — INSULIN ASPART 100 UNIT/ML IJ SOLN: 8.0000 [IU] | Freq: Three times a day (TID) | INTRAMUSCULAR | Status: DC

## 2022-10-06 MED ORDER — SALINE SPRAY 0.65 % NA SOLN
1.0000 | NASAL | Status: DC | PRN
  Filled 2022-10-06: qty 44

## 2022-10-06 MED ORDER — TRAMADOL HCL 50 MG PO TABS
50.0000 mg | ORAL_TABLET | Freq: Four times a day (QID) | ORAL | Status: DC | PRN
  Administered 2022-10-06 – 2022-10-07 (×3): 50 mg via ORAL
  Filled 2022-10-06 (×3): qty 1

## 2022-10-06 MED ORDER — INSULIN ASPART 100 UNIT/ML IJ SOLN: 0.0000 [IU] | Freq: Three times a day (TID) | INTRAMUSCULAR | Status: DC

## 2022-10-06 MED ORDER — INSULIN ASPART 100 UNIT/ML IJ SOLN
8.0000 [IU] | Freq: Three times a day (TID) | INTRAMUSCULAR | Status: DC
  Administered 2022-10-06 – 2022-10-08 (×3): 8 [IU] via SUBCUTANEOUS

## 2022-10-06 MED ORDER — INSULIN GLARGINE-YFGN 100 UNIT/ML ~~LOC~~ SOLN
25.0000 [IU] | Freq: Every day | SUBCUTANEOUS | Status: DC
  Administered 2022-10-06 – 2022-10-07 (×2): 25 [IU] via SUBCUTANEOUS
  Filled 2022-10-06 (×3): qty 0.25

## 2022-10-06 MED ORDER — DEXTROMETHORPHAN POLISTIREX ER 30 MG/5ML PO SUER
15.0000 mg | Freq: Two times a day (BID) | ORAL | Status: DC | PRN
  Administered 2022-10-06: 15 mg via ORAL
  Filled 2022-10-06 (×2): qty 5

## 2022-10-06 NOTE — Inpatient Diabetes Management (Signed)
Inpatient Diabetes Program Recommendations  AACE/ADA: New Consensus Statement on Inpatient Glycemic Control (2015)  Target Ranges:  Prepandial:   less than 140 mg/dL      Peak postprandial:   less than 180 mg/dL (1-2 hours)      Critically ill patients:  140 - 180 mg/dL   Lab Results  Component Value Date   GLUCAP 324 (H) 10/05/2022   HGBA1C 8.9 (H) 10/04/2022    Review of Glycemic Control  Diabetes history: DM2 Outpatient Diabetes medications: Ozempic 1 mg weekly Current orders for Inpatient glycemic control: Semglee 25 QD, Novolog 0-15 TID + 6 units TID. On Solumedrol 40 mg QD  HgbA1C - 8.9%  Inpatient Diabetes Program Recommendations:    Consider increasing Novolog to 0-20 units TID with meals and 0-5 HS  Consider increasing Novolog meal coverage to 8 units TID if eating > 50%  If pt is to go home on insulin, will order insulin pen starter kit and RN to teach insulin pen administration.  Will speak with pt about his HgbA1C of 8.9% and importance of reducing to 7%.  Continue to follow.  Thank you. Lorenda Peck, RD, LDN, [place] Inpatient Diabetes Coordinator [phone]

## 2022-10-06 NOTE — TOC Initial Note (Signed)
Transition of Care [facility]) - Initial/Assessment Note    Patient Details  Name: Steven Dudley MRN: [id number] Date of Birth: [birth year]  Transition of Care [facility]) CM/SW Contact:    Lavenia Atlas, RN Phone Number: 10/06/2022, 7:06 PM  Clinical Narrative:      [name] consult for SA resources. Patient currently at cardiac ultrasound. TOC will add SA resources to AVS.   TOC will continue to follow.               Barriers to Discharge: Continued Medical Work up   Patient Goals and CMS Choice            Expected Discharge Plan and Services                           DME Agency: NA         HH Agency: NA        Prior Living Arrangements/Services                       Activities of Daily Living Home Assistive Devices/Equipment: CBG Meter, Eyeglasses ADL Screening (condition at time of admission) Patient's cognitive ability adequate to safely complete daily activities?: Yes Is the patient deaf or have difficulty hearing?: Yes Does the patient have difficulty seeing, even when wearing glasses/contacts?: No Does the patient have difficulty concentrating, remembering, or making decisions?: No Patient able to express need for assistance with ADLs?: Yes Does the patient have difficulty dressing or bathing?: No Independently performs ADLs?: Yes (appropriate for developmental age) Does the patient have difficulty walking or climbing stairs?: No Weakness of Legs: None Weakness of Arms/Hands: None  Permission Sought/Granted                  Emotional Assessment           Psych Involvement: No (comment)  Admission diagnosis:  RSV (acute bronchiolitis due to respiratory syncytial virus) [J21.0] COPD exacerbation (HCC) [J44.1] Acute respiratory failure with hypoxia (HCC) [J96.01] COPD with acute exacerbation (HCC) [J44.1] Atrial fibrillation with RVR (HCC) [I48.91] Patient Active Problem List   Diagnosis Date Noted   Paroxysmal atrial  fibrillation with RVR (HCC) 10/04/2022   RSV (respiratory syncytial virus pneumonia) 10/04/2022   Chronic diastolic CHF (congestive heart failure) (HCC) 10/04/2022   Alcohol abuse 10/04/2022   Tobacco abuse 10/04/2022   Acute respiratory failure with hypercapnia (HCC) 11/29/2019   COPD with acute exacerbation (HCC) 02/18/2019   Acute exacerbation of chronic obstructive pulmonary disease (COPD) (HCC) 04/22/2018   Diabetes mellitus type 2 in obese (HCC) 04/22/2018   Atypical pneumonia    Diabetes (HCC) 01/11/2016   Prediabetes 09/17/2015   URI (upper respiratory infection) 09/14/2015   Chest pain 09/14/2015   Cough 09/14/2015   Acute on chronic respiratory failure with hypoxia and hypercapnia (HCC) 09/14/2015   COPD exacerbation (HCC) 09/14/2015   Tobacco use disorder 09/14/2015   Tachycardia 09/14/2015   Hyperglycemia 09/14/2015   Acid reflux    Obesity    Hypertension    COPD (chronic obstructive pulmonary disease) (HCC)    PCP:  Macy Mis, MD Pharmacy:   [facility] [address] [place], [place] - [address] [place] [address] Phone: [phone] Fax: [phone]     Social Determinants of Health (SDOH) Social History: SDOH Screenings   Food Insecurity: No Food Insecurity (10/04/2022)  Housing: Low Risk  (10/04/2022)  Transportation Needs:  No Transportation Needs (10/04/2022)  Utilities: Not At Risk (10/04/2022)  Tobacco Use: High Risk (10/03/2022)   SDOH Interventions:     Readmission Risk Interventions     No data to display

## 2022-10-06 NOTE — Progress Notes (Signed)
PROGRESS NOTE    Steven Dudley  [id number] DOB: 1975/12/06 DOA: 10/03/2022 PCP: Katherina Mires, MD     Brief Narrative:  Steven Dudley is a 46 y.o. male with medical history significant of COPD, CHF, OSA (noncompliant with CPAP).  He presents with 1 week history of shortness of breath.  Has been around many people, possible sick contacts.  Also with cough.  No chest pain, abdominal pain other than some soreness with cough, no nausea or vomiting.  He admits to drinking a pint of alcohol daily, smokes 2 packs/day.   In the emergency department, he was found to be hypoxic in triage, put on NRB, given solumedrol and breathing treatment, required BiPAP. Transferred to [facility] for respiratory failure. He tested +RSV. He was found to be in A Fib RVR and started on cardizem gtt and IV heparin.   New events last 24 hours / Subjective: Doing well, states he has been sleeping so much better on BiPAP and is now willing to wear CPAP qhs at home. He is stable on 5L O2 this morning.   Assessment & Plan:   Principal Problem:   Acute on chronic respiratory failure with hypoxia and hypercapnia (HCC) Active Problems:   Hypertension   Diabetes (HCC)   Diabetes mellitus type 2 in obese (HCC)   COPD with acute exacerbation (HCC)   Paroxysmal atrial fibrillation with RVR (HCC)   RSV (respiratory syncytial virus pneumonia)   Chronic diastolic CHF (congestive heart failure) (HCC)   Alcohol abuse   Tobacco abuse   Acute on chronic hypoxemic and hypercapnic respiratory failure -Was found to be 72% on room air in the emergency department, required high flow nasal cannula O2, nonrebreather and eventually BiPAP -No longer has oxygen at home, does not wear, also noncompliant with CPAP nightly for OSA -Chest x-ray negative for active disease -Wean oxygen as able to maintain saturation 88 to 92% -Transfer out of SDU today. BiPAP qhs.    COPD exacerbation with + RSV -Supportive care, Solu-Medrol,  breathing treatments   A-fib RVR -IV Cardizem drip --> PO -CHA2DS2-VASc score 2.  IV heparin --> Eliquis  -Converted to NSR    Hypertension -Cozaar   Hyperlipidemia -Lipitor   Chronic diastolic heart failure -Lasix -Echocardiogram EF 55-60%  Diabetes mellitus with hyperglycemia  -Ha1c 8.9  -Metformin, victoza on hold -Sliding scale insulin. Add semglee, adjusted SSI today    GERD -PPI   Alcohol use -CIWA protocol   Tobacco abuse -Nicotine patch   DVT prophylaxis:  apixaban (ELIQUIS) tablet 5 mg  Code Status: Full Family Communication: None at bedside Disposition Plan:  Status is: Inpatient Remains inpatient appropriate because: respiratory failure  Consultants:  None  Procedures:  None   Antimicrobials:  Anti-infectives (From admission, onward)    None        Objective: Vitals:   10/06/22 0925 10/06/22 0927 10/06/22 1022 10/06/22 1134  BP:   (!) 154/68   Pulse:      Resp:      Temp:      TempSrc:      SpO2: 95% 95%  92%  Weight:      Height:        Intake/Output Summary (Last 24 hours) at 10/06/2022 1147 Last data filed at 10/06/2022 0735 Gross per 24 hour  Intake 622.49 ml  Output 3475 ml  Net -2852.51 ml    Filed Weights   10/03/22 1922 10/04/22 1321  Weight: 129.3 kg 126 kg    Examination:  General exam: Appears calm and comfortable  Respiratory system: Diminished breath sounds bilaterally, without distress, no conversational dyspnea  Cardiovascular system: S1 & S2 heard, RRR. No murmurs. No pedal edema. Gastrointestinal system: Abdomen is nondistended, soft and nontender. Normal bowel sounds heard. Central nervous system: Alert and oriented. No focal neurological deficits. Speech clear.  Extremities: Symmetric in appearance  Skin: No rashes, lesions or ulcers on exposed skin  Psychiatry: Judgement and insight appear normal. Mood & affect appropriate.   Data Reviewed: I have personally reviewed following labs and imaging  studies  CBC: Recent Labs  Lab 10/03/22 1937 10/03/22 2142 10/04/22 1014 10/05/22 0502 10/06/22 0350  WBC 10.2  --   --  13.1* 13.8*  HGB 16.8 18.4* 18.0* 15.9 16.2  HCT 52.3* 54.0* 53.0* 51.7 52.2*  MCV 93.9  --   --  96.8 97.4  PLT 229  --   --  227 [id number]    Basic Metabolic Panel: Recent Labs  Lab 10/03/22 1937 10/03/22 2142 10/04/22 1014 10/05/22 0502 10/06/22 0350  NA 138 139 135 135 139  K 3.5 3.9 4.9 4.6 4.0  CL 95*  --   --  93* 91*  CO2 34*  --   --  33* 40*  GLUCOSE 237*  --   --  309* 247*  BUN 18  --   --  20 21*  CREATININE 1.09  --   --  0.88 0.86  CALCIUM 8.4*  --   --  8.5* 8.7*    GFR: Estimated Creatinine Clearance: 140.2 mL/min (by C-G formula based on SCr of 0.86 mg/dL). Liver Function Tests: No results for input(s): "AST", "ALT", "ALKPHOS", "BILITOT", "PROT", "ALBUMIN" in the last 168 hours. No results for input(s): "LIPASE", "AMYLASE" in the last 168 hours. No results for input(s): "AMMONIA" in the last 168 hours. Coagulation Profile: No results for input(s): "INR", "PROTIME" in the last 168 hours. Cardiac Enzymes: No results for input(s): "CKTOTAL", "CKMB", "CKMBINDEX", "TROPONINI" in the last 168 hours. BNP (last 3 results) No results for input(s): "PROBNP" in the last 8760 hours. HbA1C: Recent Labs    10/04/22 1414  HGBA1C 8.9*    CBG: Recent Labs  Lab 10/05/22 0810 10/05/22 1241 10/05/22 1632 10/05/22 2129 10/06/22 0807  GLUCAP 328* 218* 304* 324* 289*    Lipid Profile: No results for input(s): "CHOL", "HDL", "LDLCALC", "TRIG", "CHOLHDL", "LDLDIRECT" in the last 72 hours. Thyroid Function Tests: No results for input(s): "TSH", "T4TOTAL", "FREET4", "T3FREE", "THYROIDAB" in the last 72 hours. Anemia Panel: No results for input(s): "VITAMINB12", "FOLATE", "FERRITIN", "TIBC", "IRON", "RETICCTPCT" in the last 72 hours. Sepsis Labs: No results for input(s): "PROCALCITON", "LATICACIDVEN" in the last 168 hours.  Recent Results  (from the past 240 hour(s))  Resp panel by RT-PCR (RSV, Flu A&B, Covid) Anterior Nasal Swab     Status: Abnormal   Collection Time: 10/03/22  7:12 PM   Specimen: Anterior Nasal Swab  Result Value Ref Range Status   SARS Coronavirus 2 by RT PCR NEGATIVE NEGATIVE Final    Comment: (NOTE) SARS-CoV-2 target nucleic acids are NOT DETECTED.  The SARS-CoV-2 RNA is generally detectable in upper respiratory specimens during the acute phase of infection. The lowest concentration of SARS-CoV-2 viral copies this assay can detect is 138 copies/mL. A negative result does not preclude SARS-Cov-2 infection and should not be used as the sole basis for treatment or other patient management decisions. A negative result may occur with  improper specimen collection/handling, submission of specimen other than  nasopharyngeal swab, presence of viral mutation(s) within the areas targeted by this assay, and inadequate number of viral copies(<138 copies/mL). A negative result must be combined with clinical observations, patient history, and epidemiological information. The expected result is Negative.  Fact Sheet for Patients:  EntrepreneurPulse.com.au  Fact Sheet for Healthcare Providers:  IncredibleEmployment.be  This test is no t yet approved or cleared by the [place] FDA and  has been authorized for detection and/or diagnosis of SARS-CoV-2 by FDA under an Emergency Use Authorization (EUA). This EUA will remain  in effect (meaning this test can be used) for the duration of the COVID-19 declaration under Section 564(b)(1) of the Act, 21 U.S.C.section 360bbb-3(b)(1), unless the authorization is terminated  or revoked sooner.       Influenza A by PCR NEGATIVE NEGATIVE Final   Influenza B by PCR NEGATIVE NEGATIVE Final    Comment: (NOTE) The Xpert Xpress SARS-CoV-2/FLU/RSV plus assay is intended as an aid in the diagnosis of influenza from Nasopharyngeal swab  specimens and should not be used as a sole basis for treatment. Nasal washings and aspirates are unacceptable for Xpert Xpress SARS-CoV-2/FLU/RSV testing.  Fact Sheet for Patients: EntrepreneurPulse.com.au  Fact Sheet for Healthcare Providers: IncredibleEmployment.be  This test is not yet approved or cleared by the [place] FDA and has been authorized for detection and/or diagnosis of SARS-CoV-2 by FDA under an Emergency Use Authorization (EUA). This EUA will remain in effect (meaning this test can be used) for the duration of the COVID-19 declaration under Section 564(b)(1) of the Act, 21 U.S.C. section 360bbb-3(b)(1), unless the authorization is terminated or revoked.     Resp Syncytial Virus by PCR POSITIVE (A) NEGATIVE Final    Comment: (NOTE) Fact Sheet for Patients: EntrepreneurPulse.com.au  Fact Sheet for Healthcare Providers: IncredibleEmployment.be  This test is not yet approved or cleared by the [place] FDA and has been authorized for detection and/or diagnosis of SARS-CoV-2 by FDA under an Emergency Use Authorization (EUA). This EUA will remain in effect (meaning this test can be used) for the duration of the COVID-19 declaration under Section 564(b)(1) of the Act, 21 U.S.C. section 360bbb-3(b)(1), unless the authorization is terminated or revoked.  Performed at [facility], [address] [place], [place] [address]   MRSA Next Gen by PCR, Nasal     Status: None   Collection Time: 10/04/22  1:35 PM   Specimen: Nasal Mucosa; Nasal Swab  Result Value Ref Range Status   MRSA by PCR Next Gen NOT DETECTED NOT DETECTED Final    Comment: (NOTE) The GeneXpert MRSA Assay (FDA approved for NASAL specimens only), is one component of a comprehensive MRSA colonization surveillance program. It is not intended to diagnose MRSA infection nor to guide or monitor treatment for MRSA  infections. Test performance is not FDA approved in patients less than 4 years old. Performed at [facility], [place] [address] [place], [place] [address]       Radiology Studies: ECHOCARDIOGRAM COMPLETE  Result Date: 10/05/2022    ECHOCARDIOGRAM REPORT   Patient Name:   SHADDIX BOUTTE Date of Exam: 10/05/2022 Medical Rec #:  [id number]        Height:       68.0 in Accession #:    [id number]       Weight:       277.8 lb Date of Birth:  29-Oct-1975        BSA:  2.350 m Patient Age:    42 years         BP:           154/70 mmHg Patient Gender: M                HR:           84 bpm. Exam Location:  Inpatient Procedure: 2D Echo, Cardiac Doppler, Color Doppler and Intracardiac            Opacification Agent Indications:    I48.91* Unspeicified atrial fibrillation  History:        Patient has prior history of Echocardiogram examinations, most                 recent 04/24/2018. Abnormal ECG, COPD, Arrythmias:Atrial                 Fibrillation; Risk Factors:Hypertension, Diabetes and Current                 Smoker. RSV positive.  Sonographer:    Roseanna Rainbow RDCS Referring Phys: [id number] [facility]  Sonographer Comments: Technically difficult study due to poor echo windows, Technically challenging study due to limited acoustic windows, suboptimal apical window, suboptimal subcostal window, no parasternal window, suboptimal parasternal window and patient is obese. Image acquisition challenging due to patient body habitus, Image acquisition challenging due to COPD and Image acquisition challenging due to respiratory motion. Study is nearly non- diagnostic even with Definity. Extremely difficult study. Patient had apnea when asleep. IMPRESSIONS  1. Nondiagnostic study due to very poor echo windows despite use of definity contrast.  2. Left ventricular ejection fraction, by estimation, is 55 to 60%. The left ventricle has normal function. Left ventricular endocardial border not optimally  defined to evaluate regional wall motion. Left ventricular diastolic parameters were normal.  3. Right ventricular systolic function is normal. The right ventricular size is not well visualized.  4. The mitral valve was not well visualized. Trivial mitral valve regurgitation.  5. The aortic valve was not well visualized. Aortic valve regurgitation is not visualized. No aortic stenosis is present.  6. The inferior vena cava is dilated in size with <50% respiratory variability, suggesting right atrial pressure of 15 mmHg. FINDINGS  Left Ventricle: Left ventricular ejection fraction, by estimation, is 55 to 60%. The left ventricle has normal function. Left ventricular endocardial border not optimally defined to evaluate regional wall motion. Definity contrast agent was given IV to delineate the left ventricular endocardial borders. The left ventricular internal cavity size was normal in size. Suboptimal image quality limits for assessment of left ventricular hypertrophy. Left ventricular diastolic parameters were normal. Right Ventricle: The right ventricular size is not well visualized. Right vetricular wall thickness was not well visualized. Right ventricular systolic function is normal. Left Atrium: Left atrial size was not well visualized. Right Atrium: Right atrial size was not well visualized. Pericardium: There is no evidence of pericardial effusion. Mitral Valve: The mitral valve was not well visualized. Trivial mitral valve regurgitation. Tricuspid Valve: The tricuspid valve is not well visualized. Tricuspid valve regurgitation is not demonstrated. Aortic Valve: The aortic valve was not well visualized. Aortic valve regurgitation is not visualized. No aortic stenosis is present. Pulmonic Valve: The pulmonic valve was not well visualized. Aorta: The aortic root is normal in size and structure. Venous: The inferior vena cava is dilated in size with less than 50% respiratory variability, suggesting right atrial  pressure of 15 mmHg. IAS/Shunts: The atrial septum is  grossly normal.  LEFT VENTRICLE PLAX 2D LVIDd:         5.90 cm      Diastology LVIDs:         4.60 cm      LV e' medial:    10.70 cm/s LV PW:         1.00 cm      LV E/e' medial:  9.8 LV IVS:        1.00 cm      LV e' lateral:   12.00 cm/s LVOT diam:     2.30 cm      LV E/e' lateral: 8.8 LV SV:         57 LV SV Index:   24 LVOT Area:     4.15 cm  LV Volumes (MOD) LV vol d, MOD A2C: 148.0 ml LV vol d, MOD A4C: 134.0 ml LV vol s, MOD A2C: 72.6 ml LV vol s, MOD A4C: 84.2 ml LV SV MOD A2C:     75.4 ml LV SV MOD A4C:     134.0 ml LV SV MOD BP:      63.8 ml RIGHT VENTRICLE             IVC RV S prime:     12.90 cm/s  IVC diam: 2.60 cm TAPSE (M-mode): 1.9 cm LEFT ATRIUM             Index        RIGHT ATRIUM           Index LA diam:        4.30 cm 1.83 cm/m   RA Area:     14.80 cm LA Vol (A2C):   51.8 ml 22.04 ml/m  RA Volume:   36.20 ml  15.40 ml/m LA Vol (A4C):   55.6 ml 23.66 ml/m LA Biplane Vol: 53.9 ml 22.94 ml/m  AORTIC VALVE LVOT Vmax:   88.50 cm/s LVOT Vmean:  53.900 cm/s LVOT VTI:    0.136 m  AORTA Ao Root diam: 3.10 cm MITRAL VALVE MV Area (PHT): 3.75 cm     SHUNTS MV Decel Time: 202 msec     Systemic VTI:  0.14 m MV E velocity: 105.20 cm/s  Systemic Diam: 2.30 cm MV A velocity: 83.04 cm/s MV E/A ratio:  1.27 Laurance FlattenHeather Pemberton MD Electronically signed by Laurance FlattenHeather Pemberton MD Signature Date/Time: 10/05/2022/5:42:39 PM    Final       Scheduled Meds:  apixaban  5 mg Oral BID   atorvastatin  20 mg Oral Daily   Chlorhexidine Gluconate Cloth  6 each Topical Daily   diltiazem  120 mg Oral Daily   folic acid  1 mg Oral Daily   furosemide  40 mg Oral Daily   insulin aspart  0-20 Units Subcutaneous TID WC   insulin aspart  8 Units Subcutaneous TID WC   insulin glargine-yfgn  25 Units Subcutaneous Daily   ipratropium-albuterol  3 mL Nebulization Q4H   losartan  100 mg Oral Daily   methylPREDNISolone (SOLU-MEDROL) injection  40 mg Intravenous Daily    mometasone-formoterol  2 puff Inhalation BID   multivitamin with minerals  1 tablet Oral Daily   nicotine  21 mg Transdermal Daily   mouth rinse  15 mL Mouth Rinse 4 times per day   pantoprazole  40 mg Oral Daily   sodium chloride flush  3 mL Intravenous Q12H   sodium chloride flush  3 mL Intravenous Q12H   thiamine  100 mg Oral Daily   Or   thiamine  100 mg Intravenous Daily   Continuous Infusions:  sodium chloride Stopped (10/05/22 2228)   sodium chloride       LOS: 2 days   Time spent: 25 minutes   Noralee Stain, DO [facility] 10/06/2022, 11:47 AM   Available via Epic secure chat 7am-7pm After these hours, please refer to coverage provider listed on [email or web address]

## 2022-10-06 NOTE — Progress Notes (Signed)
Bipap on standby 

## 2022-10-07 DIAGNOSIS — J9622 Acute and chronic respiratory failure with hypercapnia: Secondary | ICD-10-CM | POA: Diagnosis not present

## 2022-10-07 DIAGNOSIS — J9621 Acute and chronic respiratory failure with hypoxia: Secondary | ICD-10-CM | POA: Diagnosis not present

## 2022-10-07 LAB — CBC
HCT: 59.2 % — ABNORMAL HIGH (ref 39.0–52.0)
Hemoglobin: 18.7 g/dL — ABNORMAL HIGH (ref 13.0–17.0)
MCH: 29.8 pg (ref 26.0–34.0)
MCHC: 31.6 g/dL (ref 30.0–36.0)
MCV: 94.4 fL (ref 80.0–100.0)
Platelets: 233 10*3/uL (ref 150–400)
RBC: 6.27 MIL/uL — ABNORMAL HIGH (ref 4.22–5.81)
RDW: 14.5 % (ref 11.5–15.5)
WBC: 14.8 10*3/uL — ABNORMAL HIGH (ref 4.0–10.5)
nRBC: 0 % (ref 0.0–0.2)

## 2022-10-07 LAB — GLUCOSE, CAPILLARY
Glucose-Capillary: 249 mg/dL — ABNORMAL HIGH (ref 70–99)
Glucose-Capillary: 222 mg/dL — ABNORMAL HIGH (ref 70–99)
Glucose-Capillary: 305 mg/dL — ABNORMAL HIGH (ref 70–99)
Glucose-Capillary: 272 mg/dL — ABNORMAL HIGH (ref 70–99)

## 2022-10-07 LAB — BASIC METABOLIC PANEL
Anion gap: 9 (ref 5–15)
BUN: 26 mg/dL — ABNORMAL HIGH (ref 6–20)
CO2: 40 mmol/L — ABNORMAL HIGH (ref 22–32)
Calcium: 8.9 mg/dL (ref 8.9–10.3)
Chloride: 87 mmol/L — ABNORMAL LOW (ref 98–111)
Creatinine, Ser: 0.97 mg/dL (ref 0.61–1.24)
GFR, Estimated: >60 mL/min (ref >60)
Glucose, Bld: 233 mg/dL — ABNORMAL HIGH (ref 70–99)
Potassium: 4.3 mmol/L (ref 3.5–5.1)
Sodium: 136 mmol/L (ref 135–145)

## 2022-10-07 MED ORDER — ALUM & MAG HYDROXIDE-SIMETH 200-200-20 MG/5ML PO SUSP
30.0000 mL | Freq: Once | ORAL | Status: DC
  Filled 2022-10-07: qty 30

## 2022-10-07 MED ORDER — SODIUM CHLORIDE 0.9 % IV SOLN
12.5000 mg | Freq: Four times a day (QID) | INTRAVENOUS | Status: DC | PRN
  Administered 2022-10-07 – 2022-10-08 (×3): 12.5 mg via INTRAVENOUS
  Filled 2022-10-07 (×3): qty 12.5

## 2022-10-07 MED ORDER — LOPERAMIDE HCL 2 MG PO CAPS
2.0000 mg | ORAL_CAPSULE | Freq: Four times a day (QID) | ORAL | Status: DC | PRN
  Administered 2022-10-07 (×2): 2 mg via ORAL
  Filled 2022-10-07 (×2): qty 1

## 2022-10-07 MED ORDER — ALUM & MAG HYDROXIDE-SIMETH 200-200-20 MG/5ML PO SUSP
30.0000 mL | ORAL | Status: DC | PRN
  Administered 2022-10-07 – 2022-10-08 (×2): 30 mL via ORAL
  Filled 2022-10-07: qty 30

## 2022-10-07 NOTE — Progress Notes (Signed)
PROGRESS NOTE    Steven Dudley  [id number] DOB: April 20, 1976 DOA: 10/03/2022 PCP: Katherina Mires, MD     Brief Narrative:  Steven Dudley is a 46 y.o. male with medical history significant of COPD, CHF, OSA (noncompliant with CPAP).  He presented with 1 week history of shortness of breath.  Has been around many people, possible sick contacts.  Also with cough.  No chest pain, abdominal pain other than some soreness with cough, no nausea or vomiting.     In the emergency department, he was found to be hypoxic in triage, put on NRB, given solumedrol and breathing treatment, required BiPAP. Transferred to [facility] for respiratory failure. He tested +RSV. He was found to be in A Fib RVR and started on cardizem gtt and IV heparin.   New events last 24 hours / Subjective:  Patient seen and examined.  Still has some trouble breathing but much better than before.  Afebrile.  Using about 4 L of oxygen.  Dry cough present. Developed loose stool after eating cold chicken last night.  7 episodes of loose watery stool since last night.  Mild abdominal pain and cramping.  Had significant nausea but improved by afternoon. He is scheduled for sleep apnea testing next month and he is going to wear CPAP.  Assessment & Plan:   Principal Problem:   Acute on chronic respiratory failure with hypoxia and hypercapnia (HCC) Active Problems:   Hypertension   Diabetes (HCC)   Diabetes mellitus type 2 in obese (HCC)   COPD with acute exacerbation (HCC)   Paroxysmal atrial fibrillation with RVR (HCC)   RSV (respiratory syncytial virus pneumonia)   Chronic diastolic CHF (congestive heart failure) (HCC)   Alcohol abuse   Tobacco abuse   Acute on chronic hypoxemic and hypercapnic respiratory failure, untreated sleep apnea. -Was found to be 72% on room air in the emergency department, required high flow nasal cannula O2, nonrebreather and eventually BiPAP -No longer has oxygen at home, does not wear, also  noncompliant with CPAP nightly for OSA -Chest x-ray negative for active disease -Wean oxygen as able to maintain saturation 88 to 92% -Possibly will need oxygen to go home with until his CPAP is approved.   COPD exacerbation with + RSV -Supportive care, Solu-Medrol, breathing treatments   A-fib RVR -IV Cardizem drip --> PO -CHA2DS2-VASc score 2.  IV heparin --> Eliquis  -Converted to NSR    Hypertension -Cozaar   Hyperlipidemia -Lipitor   Chronic diastolic heart failure -Lasix -Echocardiogram EF 55-60%  Diabetes mellitus with hyperglycemia  -Ha1c 8.9  -Metformin, victoza on hold -Sliding scale insulin. Added semglee.    GERD -PPI   Alcohol use -CIWA protocol.  No withdrawals so far.   Tobacco abuse -Nicotine patch.  Counseling done.   DVT prophylaxis:  apixaban (ELIQUIS) tablet 5 mg  Code Status: Full Family Communication: None at bedside Disposition Plan:  Status is: Inpatient Remains inpatient appropriate because: respiratory failure  Consultants:  None  Procedures:  None   Antimicrobials:  Anti-infectives (From admission, onward)    None        Objective: Vitals:   10/07/22 0442 10/07/22 0752 10/07/22 0755 10/07/22 1159  BP: 132/80   (!) 143/88  Pulse: 82   85  Resp: 16   15  Temp: 98.7 F (37.1 C)   98.7 F (37.1 C)  TempSrc: Oral   Oral  SpO2: 90% 92% 92% 92%  Weight:      Height:  Intake/Output Summary (Last 24 hours) at 10/07/2022 1536 Last data filed at 10/07/2022 0100 Gross per 24 hour  Intake 55 ml  Output 1070 ml  Net -1015 ml   Filed Weights   10/03/22 1922 10/04/22 1321  Weight: 129.3 kg 126 kg    General: Looks mildly anxious.  On 4 L of oxygen.  Mild respiratory distress on talking.  Alert awake and oriented x 4. Cardiovascular: S1-S2 normal.  Regular rate rhythm. Respiratory: Bilateral poor air entry.  No added sounds.  Mild distress on continuous talking. Gastrointestinal: Obese and pendulous.   Nontender. Ext: No edema or cyanosis. Neuro: Intact. Musculoskeletal: No deformities. Skin: Intact.    Data Reviewed: I have personally reviewed following labs and imaging studies  CBC: Recent Labs  Lab 10/03/22 1937 10/03/22 2142 10/04/22 1014 10/05/22 0502 10/06/22 0350 10/07/22 0458  WBC 10.2  --   --  13.1* 13.8* 14.8*  HGB 16.8 18.4* 18.0* 15.9 16.2 18.7*  HCT 52.3* 54.0* 53.0* 51.7 52.2* 59.2*  MCV 93.9  --   --  96.8 97.4 94.4  PLT 229  --   --  227 212 233   Basic Metabolic Panel: Recent Labs  Lab 10/03/22 1937 10/03/22 2142 10/04/22 1014 10/05/22 0502 10/06/22 0350 10/07/22 0458  NA 138 139 135 135 139 136  K 3.5 3.9 4.9 4.6 4.0 4.3  CL 95*  --   --  93* 91* 87*  CO2 34*  --   --  33* 40* 40*  GLUCOSE 237*  --   --  309* 247* 233*  BUN 18  --   --  20 21* 26*  CREATININE 1.09  --   --  0.88 0.86 0.97  CALCIUM 8.4*  --   --  8.5* 8.7* 8.9   GFR: Estimated Creatinine Clearance: 124.3 mL/min (by C-G formula based on SCr of 0.97 mg/dL). Liver Function Tests: No results for input(s): "AST", "ALT", "ALKPHOS", "BILITOT", "PROT", "ALBUMIN" in the last 168 hours. No results for input(s): "LIPASE", "AMYLASE" in the last 168 hours. No results for input(s): "AMMONIA" in the last 168 hours. Coagulation Profile: No results for input(s): "INR", "PROTIME" in the last 168 hours. Cardiac Enzymes: No results for input(s): "CKTOTAL", "CKMB", "CKMBINDEX", "TROPONINI" in the last 168 hours. BNP (last 3 results) No results for input(s): "PROBNP" in the last 8760 hours. HbA1C: No results for input(s): "HGBA1C" in the last 72 hours.  CBG: Recent Labs  Lab 10/06/22 1149 10/06/22 1541 10/06/22 2242 10/07/22 0729 10/07/22 1133  GLUCAP 150* 201* 213* 249* 222*   Lipid Profile: No results for input(s): "CHOL", "HDL", "LDLCALC", "TRIG", "CHOLHDL", "LDLDIRECT" in the last 72 hours. Thyroid Function Tests: No results for input(s): "TSH", "T4TOTAL", "FREET4", "T3FREE",  "THYROIDAB" in the last 72 hours. Anemia Panel: No results for input(s): "VITAMINB12", "FOLATE", "FERRITIN", "TIBC", "IRON", "RETICCTPCT" in the last 72 hours. Sepsis Labs: No results for input(s): "PROCALCITON", "LATICACIDVEN" in the last 168 hours.  Recent Results (from the past 240 hour(s))  Resp panel by RT-PCR (RSV, Flu A&B, Covid) Anterior Nasal Swab     Status: Abnormal   Collection Time: 10/03/22  7:12 PM   Specimen: Anterior Nasal Swab  Result Value Ref Range Status   SARS Coronavirus 2 by RT PCR NEGATIVE NEGATIVE Final    Comment: (NOTE) SARS-CoV-2 target nucleic acids are NOT DETECTED.  The SARS-CoV-2 RNA is generally detectable in upper respiratory specimens during the acute phase of infection. The lowest concentration of SARS-CoV-2 viral copies this assay  can detect is 138 copies/mL. A negative result does not preclude SARS-Cov-2 infection and should not be used as the sole basis for treatment or other patient management decisions. A negative result may occur with  improper specimen collection/handling, submission of specimen other than nasopharyngeal swab, presence of viral mutation(s) within the areas targeted by this assay, and inadequate number of viral copies(<138 copies/mL). A negative result must be combined with clinical observations, patient history, and epidemiological information. The expected result is Negative.  Fact Sheet for Patients:  EntrepreneurPulse.com.au  Fact Sheet for Healthcare Providers:  IncredibleEmployment.be  This test is no t yet approved or cleared by the [place] FDA and  has been authorized for detection and/or diagnosis of SARS-CoV-2 by FDA under an Emergency Use Authorization (EUA). This EUA will remain  in effect (meaning this test can be used) for the duration of the COVID-19 declaration under Section 564(b)(1) of the Act, 21 U.S.C.section 360bbb-3(b)(1), unless the authorization is  terminated  or revoked sooner.       Influenza A by PCR NEGATIVE NEGATIVE Final   Influenza B by PCR NEGATIVE NEGATIVE Final    Comment: (NOTE) The Xpert Xpress SARS-CoV-2/FLU/RSV plus assay is intended as an aid in the diagnosis of influenza from Nasopharyngeal swab specimens and should not be used as a sole basis for treatment. Nasal washings and aspirates are unacceptable for Xpert Xpress SARS-CoV-2/FLU/RSV testing.  Fact Sheet for Patients: EntrepreneurPulse.com.au  Fact Sheet for Healthcare Providers: IncredibleEmployment.be  This test is not yet approved or cleared by the [place] FDA and has been authorized for detection and/or diagnosis of SARS-CoV-2 by FDA under an Emergency Use Authorization (EUA). This EUA will remain in effect (meaning this test can be used) for the duration of the COVID-19 declaration under Section 564(b)(1) of the Act, 21 U.S.C. section 360bbb-3(b)(1), unless the authorization is terminated or revoked.     Resp Syncytial Virus by PCR POSITIVE (A) NEGATIVE Final    Comment: (NOTE) Fact Sheet for Patients: EntrepreneurPulse.com.au  Fact Sheet for Healthcare Providers: IncredibleEmployment.be  This test is not yet approved or cleared by the [place] FDA and has been authorized for detection and/or diagnosis of SARS-CoV-2 by FDA under an Emergency Use Authorization (EUA). This EUA will remain in effect (meaning this test can be used) for the duration of the COVID-19 declaration under Section 564(b)(1) of the Act, 21 U.S.C. section 360bbb-3(b)(1), unless the authorization is terminated or revoked.  Performed at [facility], [address] [place], [place] [address]   MRSA Next Gen by PCR, Nasal     Status: None   Collection Time: 10/04/22  1:35 PM   Specimen: Nasal Mucosa; Nasal Swab  Result Value Ref Range Status   MRSA by PCR Next Gen NOT  DETECTED NOT DETECTED Final    Comment: (NOTE) The GeneXpert MRSA Assay (FDA approved for NASAL specimens only), is one component of a comprehensive MRSA colonization surveillance program. It is not intended to diagnose MRSA infection nor to guide or monitor treatment for MRSA infections. Test performance is not FDA approved in patients less than 45 years old. Performed at [facility], [place] [address] [place], [place] [address]       Radiology Studies: No results found.    Scheduled Meds:  alum & mag hydroxide-simeth  30 mL Oral Once   apixaban  5 mg Oral BID   atorvastatin  20 mg Oral Daily   diltiazem  120 mg Oral Daily  folic acid  1 mg Oral Daily   furosemide  40 mg Oral Daily   insulin aspart  0-20 Units Subcutaneous TID WC   insulin aspart  8 Units Subcutaneous TID WC   insulin glargine-yfgn  25 Units Subcutaneous Daily   ipratropium-albuterol  3 mL Nebulization BID   losartan  100 mg Oral Daily   methylPREDNISolone (SOLU-MEDROL) injection  40 mg Intravenous Daily   mometasone-formoterol  2 puff Inhalation BID   multivitamin with minerals  1 tablet Oral Daily   nicotine  21 mg Transdermal Daily   mouth rinse  15 mL Mouth Rinse 4 times per day   pantoprazole  40 mg Oral Daily   sodium chloride flush  3 mL Intravenous Q12H   sodium chloride flush  3 mL Intravenous Q12H   thiamine  100 mg Oral Daily   Or   thiamine  100 mg Intravenous Daily   Continuous Infusions:  sodium chloride Stopped (10/05/22 2228)   sodium chloride     methocarbamol (ROBAXIN) IV 500 mg (10/06/22 2142)   promethazine (PHENERGAN) injection (IM or IVPB) 12.5 mg (10/07/22 0816)     LOS: 3 days   Time spent: 35 minutes

## 2022-10-07 NOTE — Progress Notes (Signed)
Patient reports having "6 or 7" episodes of "liquidy" stool overnight and this morning, as well as 3-4 occurrences of emesis.  MD notified, PRN antiemetic administered.  Patient SpO2 70% on RA after returning to bed, started on 6L HFNC.  90-93% on 6 L HFNC after 5 minutes laying in bed.  Patient advised to use [facility] for the time being.  RT notified.  Bradd Burner, RN

## 2022-10-08 DIAGNOSIS — J9621 Acute and chronic respiratory failure with hypoxia: Secondary | ICD-10-CM | POA: Diagnosis not present

## 2022-10-08 DIAGNOSIS — J9622 Acute and chronic respiratory failure with hypercapnia: Secondary | ICD-10-CM | POA: Diagnosis not present

## 2022-10-08 LAB — GLUCOSE, CAPILLARY: Glucose-Capillary: 298 mg/dL — ABNORMAL HIGH (ref 70–99)

## 2022-10-08 MED ORDER — NICOTINE 21 MG/24HR TD PT24: 21.0000 mg | MEDICATED_PATCH | Freq: Every day | TRANSDERMAL | 0 refills | Status: DC

## 2022-10-08 MED ORDER — DILTIAZEM HCL ER COATED BEADS 120 MG PO CP24: 120.0000 mg | ORAL_CAPSULE | Freq: Every day | ORAL | 0 refills | Status: DC

## 2022-10-08 MED ORDER — APIXABAN 5 MG PO TABS: 5.0000 mg | ORAL_TABLET | Freq: Two times a day (BID) | ORAL | 0 refills | Status: DC

## 2022-10-08 NOTE — TOC Transition Note (Signed)
Transition of Care [facility]) - CM/SW Discharge Note  Patient Details  Name: Steven Dudley MRN: [id number] Date of Birth: July 07, 1976  [facility] CM/SW Contact:  Ewing Schlein, LCSW Phone Number: 10/08/2022, 12:10 PM  Clinical Narrative: Patient will need to discharge home on home oxygen. CSW made DME referral to [facility] with [employer]. [employer] to deliver O2 to patient's room. CSW updated patient. TOC signing off.  Final next level of care: Home/Self Care Barriers to Discharge: Barriers Resolved  Patient Goals and CMS Choice CMS Medicare.gov Compare Post Acute Care list provided to:: Patient Choice offered to / list presented to : Patient  Discharge Plan and Services Additional resources added to the After Visit Summary for         DME Arranged: Oxygen DME Agency: [employer] Date DME Agency Contacted: 10/08/22 Representative spoke with at DME Agency: Vaughan Basta HH Agency: NA  Social Determinants of Health (SDOH) Interventions SDOH Screenings   Food Insecurity: No Food Insecurity (10/04/2022)  Housing: Low Risk  (10/04/2022)  Transportation Needs: No Transportation Needs (10/04/2022)  Utilities: Not At Risk (10/04/2022)  Tobacco Use: High Risk (10/03/2022)   Readmission Risk Interventions     No data to display

## 2022-10-08 NOTE — Discharge Summary (Signed)
Physician Discharge Summary  Adeel Loud [id number] DOB: 08-03-1976 DOA: 10/03/2022  PCP: Katherina Mires, MD  Admit date: 10/03/2022 Discharge date: 10/08/2022  Admitted From: Home Disposition: Home  Recommendations for Outpatient Follow-up:  Follow up with PCP in 1-2 weeks Keep up sleep study as a scheduled  Home Health: N/A Equipment/Devices: Oxygen 4 L via nasal cannula  Discharge Condition: Stable CODE STATUS: Full code Diet recommendation: Low-salt diet  Discharge summary: 46 year old gentleman with morbid obesity, history of COPD, sleep apnea and noncompliant to CPAP, smoking and everyday alcohol use presented to the hospital with about 1 week of shortness of breath.  He was hypoxemic on arrival initially on nonrebreather.  He was tested positive for RSV.  He was also found with A-fib with RVR and started on Cardizem drip and IV heparin and admit to the hospital.  Treated with symptomatic management and ultimately improved.  Hospital course complicated with diarrhea that was self-limiting.  Currently in sinus rhythm.  After clinical improvement he is still requiring about 4 L of oxygen on mobility so going home with oxygen.  Acute on chronic hypoxemic and hypercapnic respiratory failure multifactorial.  Untreated sleep apnea and COPD. Initially 72% on room air, clinically improving.  Currently on 4 L of oxygen with mobility. Completed antibiotic therapy and steroids. He does have nebulizer therapy at home that he will continue. Patient is a scheduled to have sleep study performed and he is agreeable to use CPAP at this time. Going home with oxygen.  New onset A-fib with RVR: Initially on Cardizem drip.  Now sinus rhythm on oral Cardizem.  Started on Eliquis.  Will send referral to cardiology for follow-up.  Smoker: Counseled to quit.  Nicotine patch prescribed.  Type 2 diabetes, uncontrolled: Worsened with steroid use.  His blood sugars are elevated.  Will stop his  steroids.  He is going to go back on metformin and Victoza.  Outpatient follow-up.   Discharge Diagnoses:  Principal Problem:   Acute on chronic respiratory failure with hypoxia and hypercapnia (HCC) Active Problems:   Hypertension   Diabetes (HCC)   Diabetes mellitus type 2 in obese (HCC)   COPD with acute exacerbation (HCC)   Paroxysmal atrial fibrillation with RVR (HCC)   RSV (respiratory syncytial virus pneumonia)   Chronic diastolic CHF (congestive heart failure) ([place])   Alcohol abuse   Tobacco abuse    Discharge Instructions  Discharge Instructions     Ambulatory referral to Cardiology   Complete by: As directed    Call MD for:  difficulty breathing, headache or visual disturbances   Complete by: As directed    Diet - low sodium heart healthy   Complete by: As directed    Diet Carb Modified   Complete by: As directed    Increase activity slowly   Complete by: As directed       Allergies as of 10/08/2022       Reactions   Acetaminophen Other (See Comments)   GI BLEEDING   Shellfish Allergy Nausea And Vomiting, Nausea Only, Other (See Comments)   Headaches and hot flashes, also        Medication List     STOP taking these medications    amLODipine 5 MG tablet Commonly known as: NORVASC   aspirin 81 MG chewable tablet   cyclobenzaprine 10 MG tablet Commonly known as: FLEXERIL   ipratropium-albuterol 0.5-2.5 (3) MG/3ML Soln Commonly known as: DUONEB   meloxicam 15 MG tablet Commonly known as: MOBIC  mometasone-formoterol 200-5 MCG/ACT Aero Commonly known as: DULERA   predniSONE 20 MG tablet Commonly known as: DELTASONE   sildenafil 20 MG tablet Commonly known as: REVATIO       TAKE these medications    Advair HFA 230-21 MCG/ACT inhaler Generic drug: fluticasone-salmeterol Inhale 2 puffs into the lungs 2 (two) times daily.   albuterol 108 (90 Base) MCG/ACT inhaler Commonly known as: VENTOLIN HFA Inhale 2 puffs into the lungs  every 6 (six) hours as needed for wheezing or shortness of breath.   albuterol (2.5 MG/3ML) 0.083% nebulizer solution Commonly known as: PROVENTIL Take 2.5 mg by nebulization every 4 (four) hours as needed for wheezing or shortness of breath.   apixaban 5 MG Tabs tablet Commonly known as: ELIQUIS Take 1 tablet (5 mg total) by mouth 2 (two) times daily.   atorvastatin 20 MG tablet Commonly known as: LIPITOR Take 20 mg by mouth daily.   benzonatate 100 MG capsule Commonly known as: TESSALON Take 100 mg by mouth 3 (three) times daily as needed for cough.   diltiazem 120 MG 24 hr capsule Commonly known as: CARDIZEM CD Take 1 capsule (120 mg total) by mouth daily. Start taking on: October 09, 2022   famotidine 20 MG tablet Commonly known as: PEPCID Take 1 tablet (20 mg total) by mouth 2 (two) times daily. What changed: when to take this   fluticasone 50 MCG/ACT nasal spray Commonly known as: FLONASE Place 1-2 sprays into both nostrils daily as needed for rhinitis or allergies.   furosemide 20 MG tablet Commonly known as: LASIX Take 1 tablet (20 mg total) by mouth 2 (two) times daily. What changed:  how much to take when to take this   losartan 100 MG tablet Commonly known as: COZAAR Take 100 mg by mouth daily.   nicotine 21 mg/24hr patch Commonly known as: NICODERM CQ - dosed in mg/24 hours Place 1 patch (21 mg total) onto the skin daily. Start taking on: October 09, 2022   omeprazole 20 MG capsule Commonly known as: PRILOSEC Take 20 mg by mouth daily before breakfast.   Ozempic (1 MG/DOSE) 4 MG/3ML Sopn Generic drug: Semaglutide (1 MG/DOSE) Inject 1 mg into the skin every Sunday.               Durable Medical Equipment  (From admission, onward)           Start     Ordered   10/08/22 1042  For home use only DME oxygen  Once       Comments: Patient Saturations on Room Air at Rest = 85%   Patient Saturations on Room Air while Ambulating = 82%    Patient Saturations on 4 Liters of oxygen while Ambulating = 95%  Question Answer Comment  Length of Need Lifetime   Mode or (Route) Nasal cannula   Liters per Minute 4   Frequency Continuous (stationary and portable oxygen unit needed)   Oxygen conserving device Yes   Oxygen delivery system Gas      12 /30/23 1042            Allergies  Allergen Reactions   Acetaminophen Other (See Comments)    GI BLEEDING   Shellfish Allergy Nausea And Vomiting, Nausea Only and Other (See Comments)    Headaches and hot flashes, also    Consultations: None   Procedures/Studies: ECHOCARDIOGRAM COMPLETE  Result Date: 10/05/2022    ECHOCARDIOGRAM REPORT   Patient Name:   Sherry Ruffing Date of Exam:  10/05/2022 Medical Rec #:  [id number]        Height:       68.0 in Accession #:    [id number]       Weight:       277.8 lb Date of Birth:  1976-02-03        BSA:          2.350 m Patient Age:    37 years         BP:           154/70 mmHg Patient Gender: M                HR:           84 bpm. Exam Location:  Inpatient Procedure: 2D Echo, Cardiac Doppler, Color Doppler and Intracardiac            Opacification Agent Indications:    I48.91* Unspeicified atrial fibrillation  History:        Patient has prior history of Echocardiogram examinations, most                 recent 04/24/2018. Abnormal ECG, COPD, Arrythmias:Atrial                 Fibrillation; Risk Factors:Hypertension, Diabetes and Current                 Smoker. RSV positive.  Sonographer:    Roseanna Rainbow RDCS Referring Phys: [id number] [facility]  Sonographer Comments: Technically difficult study due to poor echo windows, Technically challenging study due to limited acoustic windows, suboptimal apical window, suboptimal subcostal window, no parasternal window, suboptimal parasternal window and patient is obese. Image acquisition challenging due to patient body habitus, Image acquisition challenging due to COPD and Image acquisition challenging due to  respiratory motion. Study is nearly non- diagnostic even with Definity. Extremely difficult study. Patient had apnea when asleep. IMPRESSIONS  1. Nondiagnostic study due to very poor echo windows despite use of definity contrast.  2. Left ventricular ejection fraction, by estimation, is 55 to 60%. The left ventricle has normal function. Left ventricular endocardial border not optimally defined to evaluate regional wall motion. Left ventricular diastolic parameters were normal.  3. Right ventricular systolic function is normal. The right ventricular size is not well visualized.  4. The mitral valve was not well visualized. Trivial mitral valve regurgitation.  5. The aortic valve was not well visualized. Aortic valve regurgitation is not visualized. No aortic stenosis is present.  6. The inferior vena cava is dilated in size with <50% respiratory variability, suggesting right atrial pressure of 15 mmHg. FINDINGS  Left Ventricle: Left ventricular ejection fraction, by estimation, is 55 to 60%. The left ventricle has normal function. Left ventricular endocardial border not optimally defined to evaluate regional wall motion. Definity contrast agent was given IV to delineate the left ventricular endocardial borders. The left ventricular internal cavity size was normal in size. Suboptimal image quality limits for assessment of left ventricular hypertrophy. Left ventricular diastolic parameters were normal. Right Ventricle: The right ventricular size is not well visualized. Right vetricular wall thickness was not well visualized. Right ventricular systolic function is normal. Left Atrium: Left atrial size was not well visualized. Right Atrium: Right atrial size was not well visualized. Pericardium: There is no evidence of pericardial effusion. Mitral Valve: The mitral valve was not well visualized. Trivial mitral valve regurgitation. Tricuspid Valve: The tricuspid valve is not well visualized. Tricuspid valve regurgitation  is not demonstrated. Aortic  Valve: The aortic valve was not well visualized. Aortic valve regurgitation is not visualized. No aortic stenosis is present. Pulmonic Valve: The pulmonic valve was not well visualized. Aorta: The aortic root is normal in size and structure. Venous: The inferior vena cava is dilated in size with less than 50% respiratory variability, suggesting right atrial pressure of 15 mmHg. IAS/Shunts: The atrial septum is grossly normal.  LEFT VENTRICLE PLAX 2D LVIDd:         5.90 cm      Diastology LVIDs:         4.60 cm      LV e' medial:    10.70 cm/s LV PW:         1.00 cm      LV E/e' medial:  9.8 LV IVS:        1.00 cm      LV e' lateral:   12.00 cm/s LVOT diam:     2.30 cm      LV E/e' lateral: 8.8 LV SV:         57 LV SV Index:   24 LVOT Area:     4.15 cm  LV Volumes (MOD) LV vol d, MOD A2C: 148.0 ml LV vol d, MOD A4C: 134.0 ml LV vol s, MOD A2C: 72.6 ml LV vol s, MOD A4C: 84.2 ml LV SV MOD A2C:     75.4 ml LV SV MOD A4C:     134.0 ml LV SV MOD BP:      63.8 ml RIGHT VENTRICLE             IVC RV S prime:     12.90 cm/s  IVC diam: 2.60 cm TAPSE (M-mode): 1.9 cm LEFT ATRIUM             Index        RIGHT ATRIUM           Index LA diam:        4.30 cm 1.83 cm/m   RA Area:     14.80 cm LA Vol (A2C):   51.8 ml 22.04 ml/m  RA Volume:   36.20 ml  15.40 ml/m LA Vol (A4C):   55.6 ml 23.66 ml/m LA Biplane Vol: 53.9 ml 22.94 ml/m  AORTIC VALVE LVOT Vmax:   88.50 cm/s LVOT Vmean:  53.900 cm/s LVOT VTI:    0.136 m  AORTA Ao Root diam: 3.10 cm MITRAL VALVE MV Area (PHT): 3.75 cm     SHUNTS MV Decel Time: 202 msec     Systemic VTI:  0.14 m MV E velocity: 105.20 cm/s  Systemic Diam: 2.30 cm MV A velocity: 83.04 cm/s MV E/A ratio:  1.27 Gwyndolyn Kaufman MD Electronically signed by Gwyndolyn Kaufman MD Signature Date/Time: 10/05/2022/5:42:39 PM    Final    DG Chest Portable 1 View  Result Date: 10/03/2022 CLINICAL DATA:  Shortness of breath. EXAM: PORTABLE CHEST 1 VIEW COMPARISON:  Chest  radiograph dated 11/29/2019. FINDINGS: Faint bibasilar densities, likely atelectasis. No focal consolidation, pleural effusion, pneumothorax. Stable cardiac silhouette. No acute osseous pathology. IMPRESSION: No active disease. Electronically Signed   By: Anner Crete M.D.   On: 10/03/2022 19:54   (Echo, Carotid, EGD, Colonoscopy, ERCP)    Subjective: Patient seen and examined.  Breathing has improved.  No more diarrhea.  Wants to go home.  Mobilized around and needed about 4 L of oxygen to keep his saturations.  Appetite is fair.   Discharge Exam: Vitals:  10/08/22 0900 10/08/22 1000  BP:    Pulse:    Resp: 17 (!) 21  Temp:    SpO2:     Vitals:   10/08/22 0800 10/08/22 0817 10/08/22 0900 10/08/22 1000  BP:      Pulse:      Resp: 17 12 17  (!) 21  Temp:      TempSrc:      SpO2: 94%     Weight:      Height:        General: Pt is alert, awake, not in acute distress Cardiovascular: RRR, S1/S2 +, no rubs, no gallops Respiratory: CTA bilaterally, no wheezing, no rhonchi,  Abdominal: Soft, NT, ND, bowel sounds +, obese and pendulous.  Extremities: no edema, no cyanosis    The results of significant diagnostics from this hospitalization (including imaging, microbiology, ancillary and laboratory) are listed below for reference.     Microbiology: Recent Results (from the past 240 hour(s))  Resp panel by RT-PCR (RSV, Flu A&B, Covid) Anterior Nasal Swab     Status: Abnormal   Collection Time: 10/03/22  7:12 PM   Specimen: Anterior Nasal Swab  Result Value Ref Range Status   SARS Coronavirus 2 by RT PCR NEGATIVE NEGATIVE Final    Comment: (NOTE) SARS-CoV-2 target nucleic acids are NOT DETECTED.  The SARS-CoV-2 RNA is generally detectable in upper respiratory specimens during the acute phase of infection. The lowest concentration of SARS-CoV-2 viral copies this assay can detect is 138 copies/mL. A negative result does not preclude SARS-Cov-2 infection and should not be  used as the sole basis for treatment or other patient management decisions. A negative result may occur with  improper specimen collection/handling, submission of specimen other than nasopharyngeal swab, presence of viral mutation(s) within the areas targeted by this assay, and inadequate number of viral copies(<138 copies/mL). A negative result must be combined with clinical observations, patient history, and epidemiological information. The expected result is Negative.  Fact Sheet for Patients:  EntrepreneurPulse.com.au  Fact Sheet for Healthcare Providers:  IncredibleEmployment.be  This test is no t yet approved or cleared by the [place] FDA and  has been authorized for detection and/or diagnosis of SARS-CoV-2 by FDA under an Emergency Use Authorization (EUA). This EUA will remain  in effect (meaning this test can be used) for the duration of the COVID-19 declaration under Section 564(b)(1) of the Act, 21 U.S.C.section 360bbb-3(b)(1), unless the authorization is terminated  or revoked sooner.       Influenza A by PCR NEGATIVE NEGATIVE Final   Influenza B by PCR NEGATIVE NEGATIVE Final    Comment: (NOTE) The Xpert Xpress SARS-CoV-2/FLU/RSV plus assay is intended as an aid in the diagnosis of influenza from Nasopharyngeal swab specimens and should not be used as a sole basis for treatment. Nasal washings and aspirates are unacceptable for Xpert Xpress SARS-CoV-2/FLU/RSV testing.  Fact Sheet for Patients: EntrepreneurPulse.com.au  Fact Sheet for Healthcare Providers: IncredibleEmployment.be  This test is not yet approved or cleared by the [place] FDA and has been authorized for detection and/or diagnosis of SARS-CoV-2 by FDA under an Emergency Use Authorization (EUA). This EUA will remain in effect (meaning this test can be used) for the duration of the COVID-19 declaration under Section  564(b)(1) of the Act, 21 U.S.C. section 360bbb-3(b)(1), unless the authorization is terminated or revoked.     Resp Syncytial Virus by PCR POSITIVE (A) NEGATIVE Final    Comment: (NOTE) Fact Sheet for Patients: EntrepreneurPulse.com.au  Fact Sheet  for Healthcare Providers: SeriousBroker.it  This test is not yet approved or cleared by the [place] and has been authorized for detection and/or diagnosis of SARS-CoV-2 by FDA under an Emergency Use Authorization (EUA). This EUA will remain in effect (meaning this test can be used) for the duration of the COVID-19 declaration under Section 564(b)(1) of the Act, 21 U.S.C. section 360bbb-3(b)(1), unless the authorization is terminated or revoked.  Performed at [facility], [address] [place], [place] [address]   MRSA Next Gen by PCR, Nasal     Status: None   Collection Time: 10/04/22  1:35 PM   Specimen: Nasal Mucosa; Nasal Swab  Result Value Ref Range Status   MRSA by PCR Next Gen NOT DETECTED NOT DETECTED Final    Comment: (NOTE) The GeneXpert MRSA Assay (FDA approved for NASAL specimens only), is one component of a comprehensive MRSA colonization surveillance program. It is not intended to diagnose MRSA infection nor to guide or monitor treatment for MRSA infections. Test performance is not FDA approved in patients less than 38 years old. Performed at [facility], [address] [place], [place] [address]      Labs: BNP (last 3 results) Recent Labs    10/03/22 1937  BNP 43.4   Basic Metabolic Panel: Recent Labs  Lab 10/03/22 1937 10/03/22 2142 10/04/22 1014 10/05/22 0502 10/06/22 0350 10/07/22 0458  NA 138 139 135 135 139 136  K 3.5 3.9 4.9 4.6 4.0 4.3  CL 95*  --   --  93* 91* 87*  CO2 34*  --   --  33* 40* 40*  GLUCOSE 237*  --   --  309* 247* 233*  BUN 18  --   --  20 21* 26*  CREATININE 1.09  --   --  0.88 0.86 0.97   CALCIUM 8.4*  --   --  8.5* 8.7* 8.9   Liver Function Tests: No results for input(s): "AST", "ALT", "ALKPHOS", "BILITOT", "PROT", "ALBUMIN" in the last 168 hours. No results for input(s): "LIPASE", "AMYLASE" in the last 168 hours. No results for input(s): "AMMONIA" in the last 168 hours. CBC: Recent Labs  Lab 10/03/22 1937 10/03/22 2142 10/04/22 1014 10/05/22 0502 10/06/22 0350 10/07/22 0458  WBC 10.2  --   --  13.1* 13.8* 14.8*  HGB 16.8 18.4* 18.0* 15.9 16.2 18.7*  HCT 52.3* 54.0* 53.0* 51.7 52.2* 59.2*  MCV 93.9  --   --  96.8 97.4 94.4  PLT 229  --   --  227 212 233   Cardiac Enzymes: No results for input(s): "CKTOTAL", "CKMB", "CKMBINDEX", "TROPONINI" in the last 168 hours. BNP: Invalid input(s): "POCBNP" CBG: Recent Labs  Lab 10/07/22 0729 10/07/22 1133 10/07/22 1636 10/07/22 2117 10/08/22 0803  GLUCAP 249* 222* 305* 272* 298*   D-Dimer No results for input(s): "DDIMER" in the last 72 hours. Hgb A1c No results for input(s): "HGBA1C" in the last 72 hours. Lipid Profile No results for input(s): "CHOL", "HDL", "LDLCALC", "TRIG", "CHOLHDL", "LDLDIRECT" in the last 72 hours. Thyroid function studies No results for input(s): "TSH", "T4TOTAL", "T3FREE", "THYROIDAB" in the last 72 hours.  Invalid input(s): "FREET3" Anemia work up No results for input(s): "VITAMINB12", "FOLATE", "FERRITIN", "TIBC", "IRON", "RETICCTPCT" in the last 72 hours. Urinalysis    Component Value Date/Time   COLORURINE YELLOW 11/29/2019 0820   APPEARANCEUR HAZY (A) 11/29/2019 0820   LABSPEC >1.030 (H) 11/29/2019 0820   PHURINE 6.0 11/29/2019 0820  GLUCOSEU >=500 (A) 11/29/2019 0820   HGBUR SMALL (A) 11/29/2019 0820   BILIRUBINUR NEGATIVE 11/29/2019 0820   KETONESUR NEGATIVE 11/29/2019 0820   PROTEINUR 100 (A) 11/29/2019 0820   UROBILINOGEN 0.2 08/11/2013 1315   NITRITE NEGATIVE 11/29/2019 0820   LEUKOCYTESUR NEGATIVE 11/29/2019 0820   Sepsis Labs Recent Labs  Lab 10/03/22 1937  10/05/22 0502 10/06/22 0350 10/07/22 0458  WBC 10.2 13.1* 13.8* 14.8*   Microbiology Recent Results (from the past 240 hour(s))  Resp panel by RT-PCR (RSV, Flu A&B, Covid) Anterior Nasal Swab     Status: Abnormal   Collection Time: 10/03/22  7:12 PM   Specimen: Anterior Nasal Swab  Result Value Ref Range Status   SARS Coronavirus 2 by RT PCR NEGATIVE NEGATIVE Final    Comment: (NOTE) SARS-CoV-2 target nucleic acids are NOT DETECTED.  The SARS-CoV-2 RNA is generally detectable in upper respiratory specimens during the acute phase of infection. The lowest concentration of SARS-CoV-2 viral copies this assay can detect is 138 copies/mL. A negative result does not preclude SARS-Cov-2 infection and should not be used as the sole basis for treatment or other patient management decisions. A negative result may occur with  improper specimen collection/handling, submission of specimen other than nasopharyngeal swab, presence of viral mutation(s) within the areas targeted by this assay, and inadequate number of viral copies(<138 copies/mL). A negative result must be combined with clinical observations, patient history, and epidemiological information. The expected result is Negative.  Fact Sheet for Patients:  EntrepreneurPulse.com.au  Fact Sheet for Healthcare Providers:  IncredibleEmployment.be  This test is no t yet approved or cleared by the [place] FDA and  has been authorized for detection and/or diagnosis of SARS-CoV-2 by FDA under an Emergency Use Authorization (EUA). This EUA will remain  in effect (meaning this test can be used) for the duration of the COVID-19 declaration under Section 564(b)(1) of the Act, 21 U.S.C.section 360bbb-3(b)(1), unless the authorization is terminated  or revoked sooner.       Influenza A by PCR NEGATIVE NEGATIVE Final   Influenza B by PCR NEGATIVE NEGATIVE Final    Comment: (NOTE) The Xpert Xpress  SARS-CoV-2/FLU/RSV plus assay is intended as an aid in the diagnosis of influenza from Nasopharyngeal swab specimens and should not be used as a sole basis for treatment. Nasal washings and aspirates are unacceptable for Xpert Xpress SARS-CoV-2/FLU/RSV testing.  Fact Sheet for Patients: EntrepreneurPulse.com.au  Fact Sheet for Healthcare Providers: IncredibleEmployment.be  This test is not yet approved or cleared by the [place] FDA and has been authorized for detection and/or diagnosis of SARS-CoV-2 by FDA under an Emergency Use Authorization (EUA). This EUA will remain in effect (meaning this test can be used) for the duration of the COVID-19 declaration under Section 564(b)(1) of the Act, 21 U.S.C. section 360bbb-3(b)(1), unless the authorization is terminated or revoked.     Resp Syncytial Virus by PCR POSITIVE (A) NEGATIVE Final    Comment: (NOTE) Fact Sheet for Patients: EntrepreneurPulse.com.au  Fact Sheet for Healthcare Providers: IncredibleEmployment.be  This test is not yet approved or cleared by the [place] FDA and has been authorized for detection and/or diagnosis of SARS-CoV-2 by FDA under an Emergency Use Authorization (EUA). This EUA will remain in effect (meaning this test can be used) for the duration of the COVID-19 declaration under Section 564(b)(1) of the Act, 21 U.S.C. section 360bbb-3(b)(1), unless the authorization is terminated or revoked.  Performed at [facility], [address] [place], [place] [address]   MRSA Next Gen by PCR, Nasal     Status: None   Collection Time: 10/04/22  1:35 PM   Specimen: Nasal Mucosa; Nasal Swab  Result Value Ref Range Status   MRSA by PCR Next Gen NOT DETECTED NOT DETECTED Final    Comment: (NOTE) The GeneXpert MRSA Assay (FDA approved for NASAL specimens only), is one component of a comprehensive MRSA colonization  surveillance program. It is not intended to diagnose MRSA infection nor to guide or monitor treatment for MRSA infections. Test performance is not FDA approved in patients less than 83 years old. Performed at [facility], [place] [address] [place], [place] [address]      Time coordinating discharge: 35 minutes  SIGNED:   Barb Merino, MD  [facility] 10/08/2022, 10:46 AM

## 2022-10-08 NOTE — Progress Notes (Addendum)
SATURATION QUALIFICATIONS: (This note is used to comply with regulatory documentation for home oxygen)  Patient Saturations on Room Air at Rest = 85%  Patient Saturations on Room Air while Ambulating = 82%  Patient Saturations on 4 Liters of oxygen while Ambulating = 95%  Please briefly explain why patient needs home oxygen:  Supplemental oxygen required to ensure oxygen levels remain at an appropriate level  Agree with documentation

## 2022-10-08 NOTE — Progress Notes (Signed)
Patient decided to leave after discharge prior to home oxygen arriving to patient room.  Patient spoke with oxygen delivery service and confirmed that he was leaving before they would be able to deliver and, per patient, they confirmed they would still be dropping oxygen at his home.  Patient was encouraged to stay, and educated about risk of leaving without supplemental oxygen.  Patient verbalized understanding.  Physician notified.

## 2022-10-08 NOTE — Progress Notes (Addendum)
Pt requested to remove the BiPAP.

## 2022-10-08 NOTE — Progress Notes (Signed)
Pt found off of the bipap. Machine remained bedside.

## 2022-10-21 ENCOUNTER — Encounter: Payer: Self-pay | Admitting: Pulmonary Disease

## 2022-10-21 ENCOUNTER — Ambulatory Visit (INDEPENDENT_AMBULATORY_CARE_PROVIDER_SITE_OTHER): Payer: Medicaid Other | Admitting: Pulmonary Disease

## 2022-10-21 VITALS — BP 124/80 | HR 92 | Ht 67.0 in | Wt 267.2 lb

## 2022-10-21 DIAGNOSIS — I5032 Chronic diastolic (congestive) heart failure: Secondary | ICD-10-CM

## 2022-10-21 DIAGNOSIS — G4733 Obstructive sleep apnea (adult) (pediatric): Secondary | ICD-10-CM | POA: Diagnosis not present

## 2022-10-21 DIAGNOSIS — J431 Panlobular emphysema: Secondary | ICD-10-CM

## 2022-10-21 NOTE — Patient Instructions (Signed)
Schedule for in lab sleep split-night study -Past history of sleep apnea -Uses oxygen supplementation at night  Call us to let us know what you need refilled regarding smoking cessation  I will see you back in 3 months  Continue weight loss efforts  Call with significant concerns

## 2022-10-21 NOTE — Progress Notes (Unsigned)
Subjective:    Patient ID: Steven Dudley, male    DOB: 08-Sep-1976, 47 y.o.   MRN: [id number]  Young gentleman with a history of obstructive lung disease  History of obstructive sleep apnea Not using CPAP currently  Was recently hospitalized tolerated CPAP well, interested in getting back on using CPAP  Trying to quit smoking  Does not sleep very well Usually goes to bed between 11 and 12, takes about 10 minutes to fall asleep multiple awakenings Final wake up time about 9 AM  Weight is stable, has recently lost a good amount of weight  Admits to shortness of breath  Uses oxygen at 4 L at night Admits to dry mouth in the morning  Has a history of hypertension, diabetes, hypercholesterolemia  History of obstructive sleep apnea -Tried CPAP for few days but give the machine back as he did not feel it was helping  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  HENT:  Negative for congestion, dental problem, ear pain, nosebleeds, postnasal drip, rhinorrhea, sinus pressure, sneezing, sore throat and trouble swallowing.   Eyes:  Negative for redness and itching.  Respiratory:  Positive for apnea, cough and shortness of breath. Negative for chest tightness and wheezing.   Cardiovascular:  Negative for palpitations and leg swelling.  Gastrointestinal:  Negative for nausea and vomiting.  Genitourinary:  Negative for dysuria.  Musculoskeletal:  Positive for joint swelling.  Skin:  Negative for rash.  Allergic/Immunologic: Negative.  Negative for environmental allergies, food allergies and immunocompromised state.  Neurological:  Negative for headaches.  Hematological:  Does not bruise/bleed easily.  Psychiatric/Behavioral:  Positive for sleep disturbance. Negative for dysphoric mood. The patient is not nervous/anxious.     Past Medical History:  Diagnosis Date   Acid reflux    Acute respiratory failure (HCC)    Alcohol abuse    Anxiety    Asthma    daily and prn inhalers   CHF (congestive heart failure) (HCC)    Chondromalacia of right patella 09/2014   COPD (chronic obstructive pulmonary disease) ([place])    Diabetes mellitus without complication ([place])    TYPE II   Gastritis    History of traumatic brain injury 2006   Hyperglycemia    Hypertension    states under control with med., has been on med. x 1 yr.   Meniscal cyst 09/2014   right knee   Morbid obesity (HCC)    Obesity    Renal disorder    Sleep apnea    Smokers' cough ([place])    Family History  Problem Relation Age of Onset   Stomach cancer Mother    Cancer Mother    Continues to smoke actively though willing to quit -down to half a pack of cigarettes a day    Objective:   Physical Exam Constitutional:      Appearance: Normal appearance. He is obese.  HENT:     Head: Normocephalic.     Mouth/Throat:     Mouth: Mucous membranes are moist.  Eyes:     General: No scleral icterus. Cardiovascular:     Rate and Rhythm: Normal rate and regular rhythm.     Pulses: Normal pulses.     Heart sounds: Normal heart sounds. No murmur heard.    No friction rub.  Pulmonary:     Effort:  Pulmonary effort is normal. No respiratory distress.     Breath sounds: Normal breath sounds. No stridor. No wheezing or rhonchi.  Musculoskeletal:     Cervical back: No rigidity. No muscular tenderness.  Neurological:     General: No focal deficit present.     Mental Status: He is alert and oriented to person, place, and time.  Psychiatric:        Mood and Affect: Mood normal.    Vitals:   10/21/22 0935  BP: 124/80  Pulse: 92  SpO2: 91%   Chest x-ray from May 2020-reviewed by myself showing no acute infiltrate PFT shows moderate obstructive disease-last in 2020 showing obstructive disease     Assessment & Plan:  .  Chronic hypoxemic  respiratory failure .  Chronic obstructive pulmonary disease -Moderate obstruction on PFT -Encouraged to continue using CPAP nightly  .  Active smoker -Is trying to quit  .  Past history of obstructive sleep apnea -Did not tolerate CPAP therapy in the past -Wants to get back to using CPAP therapy as he did tolerate it when he was recently hospitalized  Obesity -He continues to work on weight loss efforts  Plan: Continue Advair  Continue oxygen supplementation at night  Continue smoking cessation efforts  Will schedule patient for split-night study  I will see him back in about 3 months  He is really motivated to quit smoking  Aware of risk of not treating sleep disordered breathing

## 2022-10-23 ENCOUNTER — Encounter: Payer: Self-pay | Admitting: Pulmonary Disease

## 2022-10-31 ENCOUNTER — Telehealth: Payer: Self-pay | Admitting: Pulmonary Disease

## 2022-10-31 DIAGNOSIS — G4733 Obstructive sleep apnea (adult) (pediatric): Secondary | ICD-10-CM

## 2022-10-31 NOTE — Telephone Encounter (Signed)
On last visit we were to Schedule for in lab sleep split-night study . No call yet. Pls call pt to advise. TY.  [phone]

## 2022-11-01 NOTE — Telephone Encounter (Signed)
Dr. Ander Slade, This patient is called in wondering when he is supposed to be scheduled for in lab sleep study. Per your last note I dont see any mention of this besides his past history of sleep apnea.  Did you want him to have a current sleep study done? Please advise

## 2022-11-07 NOTE — Telephone Encounter (Signed)
Yes, schedule for split-night study  It was in my notes that he is to be scheduled for split-night study, may be it was not ordered during the visit sorry

## 2022-11-07 NOTE — Telephone Encounter (Signed)
Split night study has been ordered. Routing to [employer] as an Pharmacist, hospital.

## 2022-11-09 ENCOUNTER — Telehealth: Payer: Self-pay | Admitting: Pulmonary Disease

## 2022-11-09 NOTE — Telephone Encounter (Signed)
Authorization approved - sleep study has been scheduled

## 2022-11-09 NOTE — Telephone Encounter (Signed)
Already a open encounter

## 2022-11-09 NOTE — Telephone Encounter (Signed)
PT calling. Still no call on in-lab split night sleep study call to arrange to him. Pls call to advise approx time it will be set up. [phone]

## 2022-11-18 ENCOUNTER — Ambulatory Visit: Payer: Medicaid Other | Admitting: Interventional Cardiology

## 2022-11-23 ENCOUNTER — Encounter (HOSPITAL_BASED_OUTPATIENT_CLINIC_OR_DEPARTMENT_OTHER): Payer: Medicaid Other | Admitting: Pulmonary Disease

## 2022-11-25 ENCOUNTER — Ambulatory Visit (HOSPITAL_BASED_OUTPATIENT_CLINIC_OR_DEPARTMENT_OTHER): Payer: Medicaid Other | Attending: Pulmonary Disease | Admitting: Pulmonary Disease

## 2022-11-25 DIAGNOSIS — Z6841 Body Mass Index (BMI) 40.0 and over, adult: Secondary | ICD-10-CM | POA: Diagnosis not present

## 2022-11-25 DIAGNOSIS — G4733 Obstructive sleep apnea (adult) (pediatric): Secondary | ICD-10-CM

## 2022-11-25 DIAGNOSIS — E669 Obesity, unspecified: Secondary | ICD-10-CM | POA: Insufficient documentation

## 2022-11-25 DIAGNOSIS — G4736 Sleep related hypoventilation in conditions classified elsewhere: Secondary | ICD-10-CM | POA: Diagnosis not present

## 2022-12-04 ENCOUNTER — Telehealth: Payer: Self-pay | Admitting: Pulmonary Disease

## 2022-12-04 DIAGNOSIS — G4733 Obstructive sleep apnea (adult) (pediatric): Secondary | ICD-10-CM | POA: Diagnosis not present

## 2022-12-04 NOTE — Telephone Encounter (Signed)
Call patient  Sleep study result  Date of study: 11/25/2022  Impression: Severe obstructive sleep apnea Severe oxygen desaturations  Recommendation:  Schedule for titration study  The only way to optimally treat the sleep disordered breathing and low oxygen levels is to titrate in the sleep lab  Weight loss efforts  Follow-up as previously scheduled

## 2022-12-04 NOTE — Procedures (Signed)
POLYSOMNOGRAPHY  Last, First: Dudley, Steven MRN: [id number] Gender: Male Age (years): 47 Weight (lbs): 265 DOB: 1976/06/16 BMI: 42 Primary Care: No PCP Epworth Score: 11 Referring: Laurin Coder MD Technician: Baxter Flattery Interpreting: Laurin Coder MD Study Type: NPSG Ordered Study Type: Split Night CPAP Study date: 11/25/2022 Location: [facility] CLINICAL INFORMATION Steven Dudley is a 47 year old Male and was referred to the sleep center for evaluation of G47.30 Sleep Apnea, Unspecified (780.57). Indications include Obesity, Snoring, Witnesses Apnea / Gasping During Sleep.  MEDICATIONS Patient self administered medications include: N/A. Medications administered during study include No sleep medicine administered.  SLEEP STUDY TECHNIQUE A multi-channel overnight Polysomnography study was performed. The channels recorded and monitored were central and occipital EEG, electrooculogram (EOG), submentalis EMG (chin), nasal and oral airflow, thoracic and abdominal wall motion, anterior tibialis EMG, snore microphone, electrocardiogram, and a pulse oximetry. TECHNICIAN COMMENTS Comments added by Technician: Patient had difficulty initiating sleep. Patient was restless all through the night. Comments added by Scorer: N/A SLEEP ARCHITECTURE The study was initiated at 9:58:08 PM and terminated at 4:21:00 AM. The total recorded time was 382.9 minutes. EEG confirmed total sleep time was 249.2 minutes yielding a sleep efficiency of 65.1%. Sleep onset after lights out was 48.6 minutes with a REM latency of N/A minutes. The patient spent 4.0% of the night in stage N1 sleep, 96.0% in stage N2 sleep, 0.0% in stage N3 and 0% in REM. Wake after sleep onset (WASO) was 85.0 minutes. The Arousal Index was 7.9/hour. RESPIRATORY PARAMETERS There were a total of 219 respiratory disturbances out of which 63 were apneas ( 63 obstructive, 0 mixed, 0 central) and 156 hypopneas. The apnea/hypopnea  index (AHI) was 52.7 events/hour. The central sleep apnea index was 0 events/hour. The REM AHI was N/A events/hour and NREM AHI was 52.7 events/hour. The supine AHI was 96.6 events/hour and the non supine AHI was 38.7 supine during 24.18% of sleep. Respiratory disturbances were associated with oxygen desaturation down to a nadir of 80.0% during sleep. The mean oxygen saturation during the study was 89.3%. The cumulative time under 88% oxygen saturation was 5.5 minutes.  LEG MOVEMENT DATA The total leg movements were 0 with a resulting leg movement index of 0.0/hr .Associated arousal with leg movement index was 0.0/hr.  CARDIAC DATA The underlying cardiac rhythm was most consistent with sinus rhythm. Mean heart rate during sleep was 81.8 bpm. Additional rhythm abnormalities include None.  IMPRESSIONS - Severe Obstructive Sleep apnea(OSA) - EKG showed no cardiac abnormalities. - Severe Oxygen Desaturation - The patient snored with moderate snoring volume. - No significant periodic leg movements(PLMs) during sleep. However, no significant associated arousals.  DIAGNOSIS - Severe Obstructive Sleep Apnea (G47.33) - Nocturnal Hypoxemia (G47.36)  RECOMMENDATIONS - Therapeutic CPAP titration to determine optimal pressure required to alleviate sleep disordered breathing. - Positional therapy avoiding supine position during sleep. - Avoid alcohol, sedatives and other CNS depressants that may worsen sleep apnea and disrupt normal sleep architecture. - Sleep hygiene should be reviewed to assess factors that may improve sleep quality. - Weight management and regular exercise should be initiated or continued.  [Electronically signed] 12/04/2022 08:57 AM  Sherrilyn Rist MD NPI: [id number]

## 2022-12-09 ENCOUNTER — Ambulatory Visit: Payer: Medicaid Other | Attending: Interventional Cardiology | Admitting: Cardiovascular Disease

## 2022-12-09 ENCOUNTER — Encounter: Payer: Self-pay | Admitting: Cardiovascular Disease

## 2022-12-09 ENCOUNTER — Ambulatory Visit (INDEPENDENT_AMBULATORY_CARE_PROVIDER_SITE_OTHER): Payer: Medicaid Other

## 2022-12-09 VITALS — BP 128/78 | HR 81 | Ht 67.0 in | Wt 265.8 lb

## 2022-12-09 DIAGNOSIS — I480 Paroxysmal atrial fibrillation: Secondary | ICD-10-CM

## 2022-12-09 MED ORDER — APIXABAN 5 MG PO TABS: 5.0000 mg | ORAL_TABLET | Freq: Two times a day (BID) | ORAL | 3 refills | Status: DC

## 2022-12-09 MED ORDER — DILTIAZEM HCL ER COATED BEADS 120 MG PO CP24: 120.0000 mg | ORAL_CAPSULE | Freq: Every day | ORAL | 3 refills | Status: DC

## 2022-12-09 NOTE — Telephone Encounter (Signed)
Patient is returning a call to the nurse.  Please call patient back at [phone]

## 2022-12-09 NOTE — Progress Notes (Signed)
Chief Complaint  Patient presents with   New Patient (Initial Visit)    Atrial fibrillation   History of Present Illness: 47 yo male with history of GERD, tobacco and alcohol abuse, anxiety, asthma/COPD, DM, traumatic brain injury, HTN, obesity  and sleep apnea who is here today as a new consult, referred by Dr. Doreene Nest for the evaluation of atrial fibrillation. He was admitted to [facility] in December 2023 with a COPD exacerbation, positive for RSV and had atrial fibrillation with RVR during the admission. He converted to sinus on IV Cardizem.  He was discharged on Eliquis and Cardizem. Echo December 2023 with LVEF=55-60%. No valve disease.   He tells me today that he feels well. No chest pain, dyspnea or palpitations. He has been taking Eliquis 5 mg once daily "to stretch out the prescription). He has been out of Cardizem for the past month.   Primary Care Physician: Katherina Mires, MD   Past Medical History:  Diagnosis Date   Acid reflux    Acute respiratory failure ([place])    Alcohol abuse    Anxiety    Asthma    daily and prn inhalers   CHF (congestive heart failure) ([place])    Chondromalacia of right patella 09/2014   COPD (chronic obstructive pulmonary disease) ([place])    Diabetes mellitus without complication ([place])    TYPE II   Gastritis    History of traumatic brain injury 2006   Hyperglycemia    Hypertension    states under control with med., has been on med. x 1 yr.   Meniscal cyst 09/2014   right knee   Morbid obesity ([place])    Obesity    Renal disorder    Sleep apnea    Smokers' cough [facility])     Past Surgical History:  Procedure Laterality Date   APPENDECTOMY     FOREIGN BODY REMOVAL Left 02/27/2008   hand   INCISION AND DRAINAGE ABSCESS Left 03/01/2008   hand   IRRIGATION AND DEBRIDEMENT ABSCESS Left 02/27/2008   hand   KNEE ARTHROSCOPY WITH MEDIAL MENISECTOMY Right 09/25/2014   Procedure: RIGHT KNEE SCOPE CHONDROPLASTY/MEDIAL MENISCECTOMY;  Surgeon: Ninetta Lights, MD;  Location: [facility];  Service: Orthopedics;  Laterality: Right;  ANESTHESIA: GENERAL, KNEE BLOCK   TENOSYNOVECTOMY Left 02/27/2008   flexor tenosynovectomy left hand   TONSILLECTOMY     TONSILLECTOMY AND ADENOIDECTOMY     WISDOM TOOTH EXTRACTION      Current Outpatient Medications  Medication Sig Dispense Refill   ACCU-CHEK GUIDE test strip Use to monitor blood glucose once time daily     Accu-Chek Softclix Lancets lancets See admin instructions.     albuterol (PROVENTIL) (2.5 MG/3ML) 0.083% nebulizer solution Take 2.5 mg by nebulization every 4 (four) hours as needed for wheezing or shortness of breath.     albuterol (VENTOLIN HFA) 108 (90 Base) MCG/ACT inhaler Inhale 2 puffs into the lungs every 6 (six) hours as needed for wheezing or shortness of breath. 1 Inhaler 1   amLODipine (NORVASC) 5 MG tablet Take 5 mg by mouth daily.     atorvastatin (LIPITOR) 20 MG tablet Take 20 mg by mouth daily.     famotidine (PEPCID) 20 MG tablet Take 1 tablet (20 mg total) by mouth 2 (two) times daily. (Patient taking differently: Take 20 mg by mouth daily before breakfast.) 30 tablet 2   fluticasone (FLONASE) 50 MCG/ACT nasal spray Place 1-2 sprays into both nostrils daily  as needed for rhinitis or allergies.     fluticasone-salmeterol (ADVAIR HFA) 230-21 MCG/ACT inhaler Inhale 2 puffs into the lungs 2 (two) times daily.     furosemide (LASIX) 20 MG tablet Take 1 tablet (20 mg total) by mouth 2 (two) times daily. (Patient taking differently: Take 40 mg by mouth in the morning.) 60 tablet 1   losartan (COZAAR) 100 MG tablet Take 100 mg by mouth daily.     nicotine (NICODERM CQ - DOSED IN MG/24 HOURS) 21 mg/24hr patch Place 1 patch (21 mg total) onto the skin daily. 28 patch 0   omeprazole (PRILOSEC) 20 MG capsule Take 20 mg by mouth daily before breakfast.     OZEMPIC, 1 MG/DOSE, 4 MG/3ML SOPN Inject 1 mg into the skin every Sunday.     apixaban (ELIQUIS) 5 MG TABS tablet Take  1 tablet (5 mg total) by mouth 2 (two) times daily. 180 tablet 3   diltiazem (CARDIZEM CD) 120 MG 24 hr capsule Take 1 capsule (120 mg total) by mouth daily. 90 capsule 3   No current facility-administered medications for this visit.    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  Tobacco Use   Smoking status: Heavy Smoker    Packs/day: 1.50    Years: 35.00    Total pack years: 52.50    Types: Cigarettes   Smokeless tobacco: Former  Scientific laboratory technician Use: Never used  Substance and Sexual Activity   Alcohol use: Yes    Alcohol/week: 0.0 standard drinks of alcohol    Comment: Daily   Drug use: Yes    Types: Marijuana, IV   Sexual activity: Not on file  Other Topics Concern   Not on file  Social History Narrative   Not on file   Social Determinants of Health   Financial Resource Strain: Not on file  Food Insecurity: No Food Insecurity (10/04/2022)   Hunger Vital Sign    Worried About Running Out of Food in the Last Year: Never true    Ran Out of Food in the Last Year: Never true  Transportation Needs: No Transportation Needs (10/04/2022)   PRAPARE - Hydrologist (Medical): No    Lack of Transportation (Non-Medical): No  Physical Activity: Not on file  Stress: Not on file  Social Connections: Not on file  Intimate Partner Violence: Not At Risk (10/04/2022)   Humiliation, Afraid, Rape, and Kick questionnaire    Fear of Current or Ex-Partner: No    Emotionally Abused: No    Physically Abused: No    Sexually Abused: No    Family History   Problem Relation Age of Onset   Stomach cancer Mother    Cancer Mother     Review of Systems:  As stated in the HPI and otherwise negative.   BP 128/78   Pulse 81   Ht '5\' 7"'$  (1.702 m)   Wt 120.6 kg   SpO2 96%   BMI 41.63 kg/m   Physical  Examination: General: Well developed, well nourished, NAD  HEENT: OP clear, mucus membranes moist  SKIN: warm, dry. No rashes. Neuro: No focal deficits  Musculoskeletal: Muscle strength 5/5 all ext  Psychiatric: Mood and affect normal  Neck: No JVD, no carotid bruits, no thyromegaly, no lymphadenopathy.  Lungs:Clear bilaterally, no wheezes, rhonci, crackles Cardiovascular: Regular rate and rhythm. No murmurs, gallops or rubs. Abdomen:Soft. Bowel sounds present. Non-tender.  Extremities: No lower extremity edema. Pulses are 2 + in the bilateral DP/PT.  EKG:  EKG is ordered today. The ekg ordered today demonstrates Sinus, PACs  Echo 10/05/22:  1. Nondiagnostic study due to very poor echo windows despite use of  definity contrast.   2. Left ventricular ejection fraction, by estimation, is 55 to 60%. The  left ventricle has normal function. Left ventricular endocardial border  not optimally defined to evaluate regional wall motion. Left ventricular  diastolic parameters were normal.   3. Right ventricular systolic function is normal. The right ventricular  size is not well visualized.   4. The mitral valve was not well visualized. Trivial mitral valve  regurgitation.   5. The aortic valve was not well visualized. Aortic valve regurgitation  is not visualized. No aortic stenosis is present.   6. The inferior vena cava is dilated in size with <50% respiratory  variability, suggesting right atrial pressure of 15 mmHg.   Recent Labs: 10/03/2022: B Natriuretic Peptide 43.4 10/07/2022: BUN 26; Creatinine, Ser 0.97; Hemoglobin 18.7; Platelets 233; Potassium 4.3; Sodium 136   Lipid Panel    Component Value Date/Time   CHOL 147 01/04/2016  1612   TRIG 184 (H) 01/04/2016 1612   HDL 34 (L) 01/04/2016 1612   CHOLHDL 4.3 01/04/2016 1612   VLDL 37 (H) 01/04/2016 1612   LDLCALC 76 01/04/2016 1612     Wt Readings from Last 3 Encounters:  12/09/22 120.6 kg  11/25/22 120.2 kg  10/21/22 121.2 kg    Assessment and Plan:   1. Atrial fibrillation, paroxysmal: Sinus today. His first known episode was in December 2023 when he was admitted with COPD exacerbation. CHADS VASC score of 1. No recurrence of palpations. Will continue Cardizem and Eliquis. Will arrrange 14 day Zio cardiac monitor. If he has no evidence of atrial fib will d/c Eliquis given CHADS VASC score of 1 and increased risk of bleeding with daily use of alcohol.    Labs/ tests ordered today include:   Orders Placed This Encounter  Procedures   LONG TERM MONITOR (3-14 DAYS)   EKG 12-Lead   Disposition:   F/U with me or office APP will be arranged following his cardiac monitor.    Signed, Lauree Chandler, MD, [facility] 12/09/2022 4:58 PM    [address], [place], [place]  [address] Phone: [phone]; Fax: [phone]

## 2022-12-09 NOTE — Progress Notes (Unsigned)
Applied a 14 day Zio XT monitor to patient in the office ?

## 2022-12-09 NOTE — Patient Instructions (Signed)
Medication Instructions:  No changes *If you need a refill on your cardiac medications before your next appointment, please call your pharmacy*   Lab Work: none If you have labs (blood work) drawn today and your tests are completely normal, you will receive your results only by: [place] (if you have MyChart) OR A paper copy in the mail If you have any lab test that is abnormal or we need to change your treatment, we will call you to review the results.   Testing/Procedures: 14 day Zio monitor - applied today in office   Follow-Up: Will plan follow up after monitor results.

## 2022-12-22 ENCOUNTER — Telehealth (HOSPITAL_BASED_OUTPATIENT_CLINIC_OR_DEPARTMENT_OTHER): Payer: Self-pay | Admitting: Pulmonary Disease

## 2022-12-22 NOTE — Telephone Encounter (Signed)
Pt states he is ready for CPAP machine and sleep lab states that he needs prior authorization from Dr. Gala Murdoch for appointment for CPAP. Pt has had 2 sleep studies done snd hasn't heard anything about prior auth. Please advise.

## 2022-12-28 ENCOUNTER — Telehealth: Payer: Self-pay | Admitting: Pulmonary Disease

## 2022-12-28 NOTE — Telephone Encounter (Signed)
Pls call PT to resched Titration. His wife is having surgery on the same day.  [phone] is his #

## 2022-12-29 NOTE — Telephone Encounter (Signed)
, °

## 2023-01-03 ENCOUNTER — Telehealth: Payer: Self-pay | Admitting: *Deleted

## 2023-01-03 NOTE — Telephone Encounter (Signed)
Spoke w patient. He voices understanding to stop Eliquis. He will continue diltiazem. Wanted to know when to return. He is feeling well.  I placed a recall for 12 months.  Adv we will call sooner if Dr. Angelena Form would like him seen sooner.

## 2023-01-03 NOTE — Telephone Encounter (Signed)
-----   Message from Burnell Blanks, MD sent at 12/30/2022 12:37 PM EDT ----- No evidence of atrial fibrillation. He can stop his Eliquis. I talked to him about this at his appt and told him if the monitor was ok we would call him with these instructions. Steven Dudley+

## 2023-01-03 NOTE — Telephone Encounter (Signed)
-----   Message from Burnell Blanks, MD sent at 12/30/2022 12:37 PM EDT ----- No evidence of atrial fibrillation. He can stop his Eliquis. I talked to him about this at his appt and told him if the monitor was ok we would call him with these instructions. Chris+

## 2023-01-11 ENCOUNTER — Encounter (HOSPITAL_BASED_OUTPATIENT_CLINIC_OR_DEPARTMENT_OTHER): Payer: Medicaid Other | Admitting: Pulmonary Disease

## 2023-01-20 ENCOUNTER — Ambulatory Visit (HOSPITAL_BASED_OUTPATIENT_CLINIC_OR_DEPARTMENT_OTHER): Payer: Medicaid Other | Attending: Pulmonary Disease | Admitting: Pulmonary Disease

## 2023-01-20 DIAGNOSIS — G4733 Obstructive sleep apnea (adult) (pediatric): Secondary | ICD-10-CM

## 2023-02-01 ENCOUNTER — Telehealth: Payer: Self-pay | Admitting: Pulmonary Disease

## 2023-02-01 DIAGNOSIS — G4733 Obstructive sleep apnea (adult) (pediatric): Secondary | ICD-10-CM | POA: Diagnosis not present

## 2023-02-01 NOTE — Procedures (Signed)
POLYSOMNOGRAPHY  Last, First: Azarias, Chiou MRN: [id number] Gender: Male Age (years): 47 Weight (lbs): 265 DOB: 1976/05/13 BMI: 42 Primary Care: No PCP Epworth Score: 11 Referring: Tomma Lightning MD Technician: Cherylann Parr Interpreting: Tomma Lightning MD Study Type: BiPAP Ordered Study Type: CPAP Study date: 01/20/2023 Location: [place] CLINICAL INFORMATION Steven Dudley is a 47 year old Male and was referred to the sleep center for evaluation of G47.30 Sleep Apnea, Unspecified (780.57). Indications include Obesity, Snoring, Weight Gain, Witnessed Apneas, Witnesses Apnea / Gasping During Sleep.   Most recent polysomnogram dated 11/25/2022 revealed an AHI of 52.7/h and RDI of 56.1/h. Most recent titration study was on 01/20/2023. MEDICATIONS Patient self administered medications include: N/A. Medications administered during study include No sleep medicine administered.  SLEEP STUDY TECHNIQUE The patient underwent an attended overnight polysomnography titration to assess the effects of BIPAP therapy. The following variables were monitored: EEG(C4-A1, C3-A2, O1-A2, O2-A1), EOG, submental and leg EMG, ECG, oxyhemoglobin saturation by pulse oximetry, thoracic and abdominal respiratory effort belts, nasal/oral airflow by pressure sensor, body position sensor and snoring sensor. BIPAP pressure was titrated to eliminate apneas, hypopneas and oxygen desaturation. Hypopneas were scored per AASM definition IB (4% desaturation)  TECHNICIAN COMMENTS Comments added by Technician: O2 initiated due to low sats. Patient had difficulty initiating sleep. Patient had more than two awakenings to use the bathroom. PATIENT USES 4 LITERS OF O2 AT HOME Comments added by Scorer: N/A SLEEP ARCHITECTURE The study was initiated at 10:09:52 PM and terminated at 4:54:54 AM. Total recorded time was 405 minutes. EEG confirmed total sleep time was 306.9 minutes yielding a sleep efficiency of 75.8%.  Sleep onset after lights out was 4.6 minutes with a REM latency of 275.0 minutes. The patient spent 3.9% of the night in stage N1 sleep, 80.1% in stage N2 sleep, 0.2% in stage N3 and 15.8% in REM. The Arousal Index was 1.4/hour. RESPIRATORY PARAMETERS The overall AHI was 48.3 per hour, and the RDI was 48.3 events/hour with a central apnea index of 0per hour. The most appropriate setting of BiPAP was 22/18 cm H2O. At this setting, the sleep efficiency was 100 % and the patient was supine for 100%. The AHI was 1.8 events per hour, and the RDI was 1.8 events/hour (with 0 central events) and the arousal index was 0 per hour.The oxygen nadir was 81.0% during sleep.    The cumulative time under 88% oxygen saturation was 5.5 minutes  LEG MOVEMENT DATA The total leg movements were 287 with a resulting leg movement index of 56.1. Associated arousal with leg movement index was 0.2. CARDIAC DATA The underlying cardiac rhythm was most consistent with sinus rhythm. Mean heart rate during sleep was 78.7 bpm. Additional rhythm abnormalities include None.  IMPRESSIONS - Severe Obstructive Sleep apnea(OSA) Optimal pressure attained. - EKG showed no cardiac abnormalities. - Severe Oxygen Desaturation - No snoring was audible during this study. - No significant periodic leg movements(PLMs) during sleep. However, no significant associated  arousals.  DIAGNOSIS - Severe Obstructive Sleep Apnea (G47.33)  RECOMMENDATIONS - Trial of BiPAP therapy on 22/18 cm H2Owith Fex of 1 with a Large size [employer] Full Face Evora Full mask and heated humidification.4 Liters of oxygen piped in to system - Avoid alcohol, sedatives and other CNS depressants that may worsen sleep apnea and disrupt normal sleep architecture. - Sleep hygiene should be reviewed to assess factors that may improve sleep quality. - Weight management and regular exercise should be initiated or continued. - Return to  Sleep Center for re-evaluation  after 4 weeks of therapy  [Electronically signed] 02/01/2023 06:05 AM  Virl Diamond MD NPI: [id number]

## 2023-02-01 NOTE — Telephone Encounter (Signed)
Call patient  Sleep study result  Date of study: 01/20/2023  Impression: Severe obstructive sleep apnea with severe oxygen desaturations  Recommendation: DME referral  - Trial of BiPAP therapy on 22/18 cm H2Owith Fex of 1 with a Large size [employer] Full Face Evora Full mask and heated humidification.4 Liters of oxygen piped in to system  Encourage weight loss measures  Follow-up in the office 4 to 6 weeks following initiation of treatment

## 2023-02-09 NOTE — Telephone Encounter (Signed)
Spoke with patient regarding Sleep study per Dr.Olalere  Call patient   Sleep study result   Date of study: 01/20/2023   Impression: Severe obstructive sleep apnea with severe oxygen desaturations   Recommendation: DME referral   - Trial of BiPAP therapy on 22/18 cm H2Owith Fex of 1 with a Large size [employer] Full Face Evora Full mask and heated humidification.4 Liters of oxygen piped in to system   Encourage weight loss measures   Follow-up in the office 4 to 6 weeks following initiation of treatment    Patient voice was understanding and order has been placed  Nothing else further needed.

## 2023-02-26 ENCOUNTER — Emergency Department (HOSPITAL_BASED_OUTPATIENT_CLINIC_OR_DEPARTMENT_OTHER): Payer: Medicaid Other

## 2023-02-26 ENCOUNTER — Other Ambulatory Visit: Payer: Self-pay

## 2023-02-26 ENCOUNTER — Encounter (HOSPITAL_BASED_OUTPATIENT_CLINIC_OR_DEPARTMENT_OTHER): Payer: Self-pay | Admitting: Emergency Medicine

## 2023-02-26 ENCOUNTER — Emergency Department (HOSPITAL_BASED_OUTPATIENT_CLINIC_OR_DEPARTMENT_OTHER)
Admission: EM | Admit: 2023-02-26 | Discharge: 2023-02-27 | Disposition: A | Discharge status: Home / Self Care | Payer: Medicaid Other | Attending: Emergency Medicine | Admitting: Emergency Medicine

## 2023-02-26 DIAGNOSIS — I10 Essential (primary) hypertension: Secondary | ICD-10-CM | POA: Insufficient documentation

## 2023-02-26 DIAGNOSIS — Z79899 Other long term (current) drug therapy: Secondary | ICD-10-CM | POA: Insufficient documentation

## 2023-02-26 DIAGNOSIS — R112 Nausea with vomiting, unspecified: Secondary | ICD-10-CM

## 2023-02-26 DIAGNOSIS — E119 Type 2 diabetes mellitus without complications: Secondary | ICD-10-CM | POA: Diagnosis not present

## 2023-02-26 DIAGNOSIS — R1030 Lower abdominal pain, unspecified: Secondary | ICD-10-CM | POA: Diagnosis present

## 2023-02-26 DIAGNOSIS — N200 Calculus of kidney: Secondary | ICD-10-CM | POA: Diagnosis not present

## 2023-02-26 DIAGNOSIS — Z7951 Long term (current) use of inhaled steroids: Secondary | ICD-10-CM | POA: Diagnosis not present

## 2023-02-26 DIAGNOSIS — J449 Chronic obstructive pulmonary disease, unspecified: Secondary | ICD-10-CM | POA: Diagnosis not present

## 2023-02-26 DIAGNOSIS — R109 Unspecified abdominal pain: Secondary | ICD-10-CM

## 2023-02-26 DIAGNOSIS — Z794 Long term (current) use of insulin: Secondary | ICD-10-CM | POA: Diagnosis not present

## 2023-02-26 LAB — URINALYSIS, ROUTINE W REFLEX MICROSCOPIC
Bilirubin Urine: NEGATIVE
Glucose, UA: NEGATIVE mg/dL
Ketones, ur: NEGATIVE mg/dL
Leukocytes,Ua: NEGATIVE
Nitrite: NEGATIVE
Protein, ur: 30 mg/dL — AB
Specific Gravity, Urine: 1.015 (ref 1.005–1.030)
pH: 8.5 — ABNORMAL HIGH (ref 5.0–8.0)

## 2023-02-26 LAB — CBC WITH DIFFERENTIAL/PLATELET
Abs Immature Granulocytes: 0.11 10*3/uL — ABNORMAL HIGH (ref 0.00–0.07)
Basophils Absolute: 0.1 10*3/uL (ref 0.0–0.1)
Basophils Relative: 0 %
Eosinophils Absolute: 0 10*3/uL (ref 0.0–0.5)
Eosinophils Relative: 0 %
HCT: 50.2 % (ref 39.0–52.0)
Hemoglobin: 17.1 g/dL — ABNORMAL HIGH (ref 13.0–17.0)
Immature Granulocytes: 1 %
Lymphocytes Relative: 14 %
Lymphs Abs: 2.5 10*3/uL (ref 0.7–4.0)
MCH: 30.1 pg (ref 26.0–34.0)
MCHC: 34.1 g/dL (ref 30.0–36.0)
MCV: 88.2 fL (ref 80.0–100.0)
Monocytes Absolute: 1.4 10*3/uL — ABNORMAL HIGH (ref 0.1–1.0)
Monocytes Relative: 8 %
Neutro Abs: 14.1 10*3/uL — ABNORMAL HIGH (ref 1.7–7.7)
Neutrophils Relative %: 77 %
Platelets: 299 10*3/uL (ref 150–400)
RBC: 5.69 MIL/uL (ref 4.22–5.81)
RDW: 12.6 % (ref 11.5–15.5)
WBC: 18.2 10*3/uL — ABNORMAL HIGH (ref 4.0–10.5)
nRBC: 0 % (ref 0.0–0.2)

## 2023-02-26 LAB — COMPREHENSIVE METABOLIC PANEL
ALT: 14 U/L (ref 0–44)
AST: 16 U/L (ref 15–41)
Albumin: 4.1 g/dL (ref 3.5–5.0)
Alkaline Phosphatase: 82 U/L (ref 38–126)
Anion gap: 13 (ref 5–15)
BUN: 12 mg/dL (ref 6–20)
CO2: 28 mmol/L (ref 22–32)
Calcium: 8.8 mg/dL — ABNORMAL LOW (ref 8.9–10.3)
Chloride: 95 mmol/L — ABNORMAL LOW (ref 98–111)
Creatinine, Ser: 1.1 mg/dL (ref 0.61–1.24)
GFR, Estimated: >60 mL/min (ref >60)
Glucose, Bld: 250 mg/dL — ABNORMAL HIGH (ref 70–99)
Potassium: 3.6 mmol/L (ref 3.5–5.1)
Sodium: 136 mmol/L (ref 135–145)
Total Bilirubin: 0.6 mg/dL (ref 0.3–1.2)
Total Protein: 7.6 g/dL (ref 6.5–8.1)

## 2023-02-26 LAB — URINALYSIS, MICROSCOPIC (REFLEX): RBC / HPF: >50 RBC/hpf (ref 0–5)

## 2023-02-26 LAB — LIPASE, BLOOD: Lipase: 37 U/L (ref 11–51)

## 2023-02-26 MED ORDER — TAMSULOSIN HCL 0.4 MG PO CAPS: 0.4000 mg | ORAL_CAPSULE | Freq: Every day | ORAL | 0 refills | Status: DC

## 2023-02-26 MED ORDER — ONDANSETRON HCL 4 MG/2ML IJ SOLN
4.0000 mg | Freq: Once | INTRAMUSCULAR | Status: AC
  Administered 2023-02-26: 4 mg via INTRAVENOUS
  Filled 2023-02-26: qty 2

## 2023-02-26 MED ORDER — DIPHENHYDRAMINE HCL 50 MG/ML IJ SOLN
25.0000 mg | Freq: Once | INTRAMUSCULAR | Status: AC
  Administered 2023-02-26: 25 mg via INTRAVENOUS
  Filled 2023-02-26: qty 1

## 2023-02-26 MED ORDER — ONDANSETRON 4 MG PO TBDP: 4.0000 mg | ORAL_TABLET | Freq: Three times a day (TID) | ORAL | 0 refills | Status: DC | PRN

## 2023-02-26 MED ORDER — MORPHINE SULFATE (PF) 4 MG/ML IV SOLN
4.0000 mg | Freq: Once | INTRAVENOUS | Status: AC
  Administered 2023-02-26: 4 mg via INTRAVENOUS
  Filled 2023-02-26: qty 1

## 2023-02-26 MED ORDER — IOHEXOL 300 MG/ML  SOLN
100.0000 mL | Freq: Once | INTRAMUSCULAR | Status: AC | PRN
  Administered 2023-02-26: 100 mL via INTRAVENOUS

## 2023-02-26 MED ORDER — OXYCODONE HCL 5 MG PO TABS
5.0000 mg | ORAL_TABLET | ORAL | Status: AC
  Administered 2023-02-27: 5 mg via ORAL
  Filled 2023-02-26: qty 1

## 2023-02-26 MED ORDER — ONDANSETRON HCL 4 MG/2ML IJ SOLN
4.0000 mg | Freq: Once | INTRAMUSCULAR | Status: AC
  Administered 2023-02-27: 4 mg via INTRAVENOUS
  Filled 2023-02-26: qty 2

## 2023-02-26 MED ORDER — OXYCODONE HCL 5 MG PO TABS: 5.0000 mg | ORAL_TABLET | ORAL | 0 refills | Status: DC | PRN

## 2023-02-26 MED ORDER — METOCLOPRAMIDE HCL 5 MG/ML IJ SOLN
10.0000 mg | Freq: Once | INTRAMUSCULAR | Status: AC
  Administered 2023-02-26: 10 mg via INTRAVENOUS
  Filled 2023-02-26: qty 2

## 2023-02-26 MED ORDER — KETOROLAC TROMETHAMINE 15 MG/ML IJ SOLN
15.0000 mg | Freq: Once | INTRAMUSCULAR | Status: AC
  Administered 2023-02-26: 15 mg via INTRAVENOUS
  Filled 2023-02-26: qty 1

## 2023-02-26 NOTE — ED Triage Notes (Addendum)
Pt reports LT lower back/flank, general lower abd, bil testicular pain and hematuria x 2d; also reports nausea and feels like he constantly has to have a BM, but nothing is happening; pt appears uncomfortable

## 2023-02-26 NOTE — ED Notes (Signed)
RN alerted by Nurse Secretary that pt's SpO2 was in the 80s. RN at bedside, pt denies shob. States he only has abd pain and nausea. RN placed pt on 3L O2 via nasal cannula, per pt he wears 4L O2 at night.

## 2023-02-26 NOTE — Discharge Instructions (Signed)
You were seen for your kidney stone in the emergency department.    At home, please take Tylenol and ibuprofen for your pain. You may also take the oxycodone we have prescribed you for any breakthrough pain that may have.  Do not take this before driving or operating heavy machinery.  Do not take this medication with alcohol.  Use the flowmax we have give you every day until the kidney stone passes. Please also strain your urine to collect the kidney stone. Store it in a container and take it to your urologist for analysis.   Follow-up with urology in a week to discuss your symptoms.   Return immediately to the emergency department if you experience any of the following: fever, unbearable pain, urinary retention, or any other concerning symptoms.    Thank you for visiting our Emergency Department. It was a pleasure taking care of you today.   

## 2023-02-26 NOTE — ED Provider Notes (Signed)
[place] [facility] Provider Note   CSN: [id number] Arrival date & time: 02/26/23  1943     History {Add pertinent medical, surgical, social history, OB history to HPI:1} Chief Complaint  Patient presents with   Flank Pain    Steven Dudley is a 47 y.o. male.  47 year old male with a history of COPD on 4 L oxygen at night, diabetes, and hypertension who presents to the emergency department with multiple complaints.  Says that for the past 2 days he has had lower abdominal and lower back pain and nausea with occasional nonbloody nonbilious emesis.  Denies any fevers.  Has also had bilateral testicle pain left greater than right and intermittent hematuria.  No dysuria or frequency.  Says that it hurts between his testicle and scrotum and feels a constant urge to poop or pee.  No penile discharge.  Also has felt diffuse bodyaches.  Does smoke, drink, and use marijuana but denies any opiate use recently.  No history of IV drug use ever.       Home Medications Prior to Admission medications   Medication Sig Start Date End Date Taking? Authorizing Provider  ACCU-CHEK GUIDE test strip Use to monitor blood glucose once time daily 10/05/22 10/05/23  [provider]  Accu-Chek Softclix Lancets lancets See admin instructions. 10/05/22 10/05/23  [provider]  albuterol (PROVENTIL) (2.5 MG/3ML) 0.083% nebulizer solution Take 2.5 mg by nebulization every 4 (four) hours as needed for wheezing or shortness of breath. 11/26/19   [provider]  albuterol (VENTOLIN HFA) 108 (90 Base) MCG/ACT inhaler Inhale 2 puffs into the lungs every 6 (six) hours as needed for wheezing or shortness of breath. 02/20/19   Narda Bonds, MD  amLODipine (NORVASC) 5 MG tablet Take 5 mg by mouth daily. 11/21/22   [provider]  atorvastatin (LIPITOR) 20 MG tablet Take 20 mg by mouth daily.    [provider]  diltiazem (CARDIZEM CD)  120 MG 24 hr capsule Take 1 capsule (120 mg total) by mouth daily. 12/09/22   Kathleene Hazel, MD  famotidine (PEPCID) 20 MG tablet Take 1 tablet (20 mg total) by mouth 2 (two) times daily. Patient taking differently: Take 20 mg by mouth daily before breakfast. 02/17/19   Arby Barrette, MD  fluticasone (FLONASE) 50 MCG/ACT nasal spray Place 1-2 sprays into both nostrils daily as needed for rhinitis or allergies.    [provider]  fluticasone-salmeterol (ADVAIR HFA) 230-21 MCG/ACT inhaler Inhale 2 puffs into the lungs 2 (two) times daily.    [provider]  furosemide (LASIX) 20 MG tablet Take 1 tablet (20 mg total) by mouth 2 (two) times daily. Patient taking differently: Take 40 mg by mouth in the morning. 04/25/18   Richarda Overlie, MD  losartan (COZAAR) 100 MG tablet Take 100 mg by mouth daily. 10/08/19   [provider]  nicotine (NICODERM CQ - DOSED IN MG/24 HOURS) 21 mg/24hr patch Place 1 patch (21 mg total) onto the skin daily. 10/09/22   Dorcas Carrow, MD  omeprazole (PRILOSEC) 20 MG capsule Take 20 mg by mouth daily before breakfast. 10/02/19   [provider]  OZEMPIC, 1 MG/DOSE, 4 MG/3ML SOPN Inject 1 mg into the skin every Sunday.    [provider]      Allergies    Acetaminophen and Shellfish allergy    Review of Systems   Review of Systems  Physical Exam Updated Vital Signs BP Marland Kitchen)  144/73 (BP Location: Right Arm)   Pulse 73   Temp 98.4 F (36.9 C) (Oral)   Resp 18   Ht 5\' 8"  (1.727 m)   Wt 122.5 kg   SpO2 92%   BMI 41.05 kg/m  Physical Exam Vitals and nursing note reviewed.  Constitutional:      General: He is not in acute distress.    Appearance: He is well-developed.  HENT:     Head: Normocephalic and atraumatic.     Right Ear: External ear normal.     Left Ear: External ear normal.     Nose: Nose normal.  Eyes:     Extraocular Movements: Extraocular movements intact.     Conjunctiva/sclera: Conjunctivae  normal.     Pupils: Pupils are equal, round, and reactive to light.  Cardiovascular:     Rate and Rhythm: Normal rate and regular rhythm.     Heart sounds: Normal heart sounds.  Pulmonary:     Effort: Pulmonary effort is normal. No respiratory distress.     Breath sounds: Normal breath sounds.  Abdominal:     General: There is no distension.     Palpations: Abdomen is soft. There is no mass.     Tenderness: There is abdominal tenderness (Lower abdomen). There is no guarding.  Genitourinary:    Penis: Normal.      Comments: No significant testicular swelling.  Tenderness palpation of left testicle.  Tenderness palpation of the perineum. Musculoskeletal:     Cervical back: Normal range of motion and neck supple.     Right lower leg: No edema.     Left lower leg: No edema.  Skin:    General: Skin is warm and dry.  Neurological:     Mental Status: He is alert. Mental status is at baseline.  Psychiatric:        Mood and Affect: Mood normal.        Behavior: Behavior normal.     ED Results / Procedures / Treatments   Labs (all labs ordered are listed, but only abnormal results are displayed) Labs Reviewed  CBC WITH DIFFERENTIAL/PLATELET - Abnormal; Notable for the following components:      Result Value   WBC 18.2 (*)    Hemoglobin 17.1 (*)    Neutro Abs 14.1 (*)    Monocytes Absolute 1.4 (*)    Abs Immature Granulocytes 0.11 (*)    All other components within normal limits  COMPREHENSIVE METABOLIC PANEL - Abnormal; Notable for the following components:   Chloride 95 (*)    Glucose, Bld 250 (*)    Calcium 8.8 (*)    All other components within normal limits  LIPASE, BLOOD  URINALYSIS, ROUTINE W REFLEX MICROSCOPIC    EKG EKG Interpretation  Date/Time:  Sunday Feb 26 2023 20:40:06 EDT Ventricular Rate:  72 PR Interval:  152 QRS Duration: 90 QT Interval:  401 QTC Calculation: 439 R Axis:   80 Text Interpretation: Sinus arrhythmia Confirmed by Vonita Moss  [phone]) on 02/26/2023 8:48:11 PM  Radiology No results found.  Procedures Procedures  {Document cardiac monitor, telemetry assessment procedure when appropriate:1}  Medications Ordered in ED Medications - No data to display  ED Course/ Medical Decision Making/ A&P   {   Click here for ABCD2, HEART and other calculatorsREFRESH Note before signing :1}                          Medical Decision Making Amount  and/or Complexity of Data Reviewed Labs: ordered. Radiology: ordered.  Risk Prescription drug management.   ***  {Document critical care time when appropriate:1} {Document review of labs and clinical decision tools ie heart score, Chads2Vasc2 etc:1}  {Document your independent review of radiology images, and any outside records:1} {Document your discussion with family members, caretakers, and with consultants:1} {Document social determinants of health affecting pt's care:1} {Document your decision making why or why not admission, treatments were needed:1} Final Clinical Impression(s) / ED Diagnoses Final diagnoses:  None    Rx / DC Orders ED Discharge Orders     None

## 2023-02-28 ENCOUNTER — Emergency Department (HOSPITAL_COMMUNITY): Payer: Medicaid Other

## 2023-02-28 ENCOUNTER — Inpatient Hospital Stay (HOSPITAL_COMMUNITY)
Admission: EM | Admit: 2023-02-28 | Discharge: 2023-03-02 | DRG: 694 | Disposition: A | Discharge status: Home / Self Care | Payer: Medicaid Other | Attending: Internal Medicine | Admitting: Internal Medicine

## 2023-02-28 ENCOUNTER — Other Ambulatory Visit: Payer: Self-pay

## 2023-02-28 DIAGNOSIS — I480 Paroxysmal atrial fibrillation: Secondary | ICD-10-CM

## 2023-02-28 DIAGNOSIS — N179 Acute kidney failure, unspecified: Secondary | ICD-10-CM

## 2023-02-28 DIAGNOSIS — E119 Type 2 diabetes mellitus without complications: Secondary | ICD-10-CM

## 2023-02-28 DIAGNOSIS — J9611 Chronic respiratory failure with hypoxia: Secondary | ICD-10-CM | POA: Diagnosis not present

## 2023-02-28 DIAGNOSIS — Z6841 Body Mass Index (BMI) 40.0 and over, adult: Secondary | ICD-10-CM

## 2023-02-28 DIAGNOSIS — N201 Calculus of ureter: Principal | ICD-10-CM

## 2023-02-28 DIAGNOSIS — Z7985 Long-term (current) use of injectable non-insulin antidiabetic drugs: Secondary | ICD-10-CM

## 2023-02-28 DIAGNOSIS — D72829 Elevated white blood cell count, unspecified: Secondary | ICD-10-CM | POA: Diagnosis present

## 2023-02-28 DIAGNOSIS — G4733 Obstructive sleep apnea (adult) (pediatric): Secondary | ICD-10-CM

## 2023-02-28 DIAGNOSIS — Z91013 Allergy to seafood: Secondary | ICD-10-CM

## 2023-02-28 DIAGNOSIS — Z886 Allergy status to analgesic agent status: Secondary | ICD-10-CM

## 2023-02-28 DIAGNOSIS — I5032 Chronic diastolic (congestive) heart failure: Secondary | ICD-10-CM | POA: Diagnosis present

## 2023-02-28 DIAGNOSIS — N202 Calculus of kidney with calculus of ureter: Principal | ICD-10-CM | POA: Diagnosis present

## 2023-02-28 DIAGNOSIS — K219 Gastro-esophageal reflux disease without esophagitis: Secondary | ICD-10-CM | POA: Diagnosis present

## 2023-02-28 DIAGNOSIS — J449 Chronic obstructive pulmonary disease, unspecified: Secondary | ICD-10-CM | POA: Diagnosis not present

## 2023-02-28 DIAGNOSIS — I10 Essential (primary) hypertension: Secondary | ICD-10-CM | POA: Diagnosis present

## 2023-02-28 DIAGNOSIS — I110 Hypertensive heart disease with heart failure: Secondary | ICD-10-CM | POA: Diagnosis present

## 2023-02-28 DIAGNOSIS — J4489 Other specified chronic obstructive pulmonary disease: Secondary | ICD-10-CM | POA: Diagnosis present

## 2023-02-28 DIAGNOSIS — Z7951 Long term (current) use of inhaled steroids: Secondary | ICD-10-CM

## 2023-02-28 DIAGNOSIS — E860 Dehydration: Secondary | ICD-10-CM | POA: Diagnosis present

## 2023-02-28 DIAGNOSIS — Z9049 Acquired absence of other specified parts of digestive tract: Secondary | ICD-10-CM

## 2023-02-28 DIAGNOSIS — G4730 Sleep apnea, unspecified: Secondary | ICD-10-CM | POA: Diagnosis present

## 2023-02-28 DIAGNOSIS — E1165 Type 2 diabetes mellitus with hyperglycemia: Secondary | ICD-10-CM | POA: Diagnosis present

## 2023-02-28 DIAGNOSIS — Z79899 Other long term (current) drug therapy: Secondary | ICD-10-CM

## 2023-02-28 DIAGNOSIS — F17210 Nicotine dependence, cigarettes, uncomplicated: Secondary | ICD-10-CM | POA: Diagnosis present

## 2023-02-28 DIAGNOSIS — E66813 Obesity, class 3: Secondary | ICD-10-CM

## 2023-02-28 DIAGNOSIS — N139 Obstructive and reflux uropathy, unspecified: Secondary | ICD-10-CM | POA: Diagnosis present

## 2023-02-28 DIAGNOSIS — Z87820 Personal history of traumatic brain injury: Secondary | ICD-10-CM

## 2023-02-28 LAB — CBC WITH DIFFERENTIAL/PLATELET
Abs Immature Granulocytes: 0.26 10*3/uL — ABNORMAL HIGH (ref 0.00–0.07)
Basophils Absolute: 0.1 10*3/uL (ref 0.0–0.1)
Basophils Relative: 0 %
Eosinophils Absolute: 0 10*3/uL (ref 0.0–0.5)
Eosinophils Relative: 0 %
HCT: 48.5 % (ref 39.0–52.0)
Hemoglobin: 16.3 g/dL (ref 13.0–17.0)
Immature Granulocytes: 1 %
Lymphocytes Relative: 9 %
Lymphs Abs: 1.6 10*3/uL (ref 0.7–4.0)
MCH: 30.1 pg (ref 26.0–34.0)
MCHC: 33.6 g/dL (ref 30.0–36.0)
MCV: 89.5 fL (ref 80.0–100.0)
Monocytes Absolute: 1.3 10*3/uL — ABNORMAL HIGH (ref 0.1–1.0)
Monocytes Relative: 7 %
Neutro Abs: 14.9 10*3/uL — ABNORMAL HIGH (ref 1.7–7.7)
Neutrophils Relative %: 83 %
Platelets: 271 10*3/uL (ref 150–400)
RBC: 5.42 MIL/uL (ref 4.22–5.81)
RDW: 12.4 % (ref 11.5–15.5)
WBC: 18.6 10*3/uL — ABNORMAL HIGH (ref 4.0–10.5)
nRBC: 0 % (ref 0.0–0.2)

## 2023-02-28 LAB — URINALYSIS, ROUTINE W REFLEX MICROSCOPIC
Bilirubin Urine: NEGATIVE
Glucose, UA: NEGATIVE mg/dL
Ketones, ur: NEGATIVE mg/dL
Leukocytes,Ua: NEGATIVE
Nitrite: NEGATIVE
Protein, ur: NEGATIVE mg/dL
Specific Gravity, Urine: 1.01 (ref 1.005–1.030)
pH: 6 (ref 5.0–8.0)

## 2023-02-28 LAB — COMPREHENSIVE METABOLIC PANEL
ALT: 12 U/L (ref 0–44)
AST: 11 U/L — ABNORMAL LOW (ref 15–41)
Albumin: 3.9 g/dL (ref 3.5–5.0)
Alkaline Phosphatase: 65 U/L (ref 38–126)
Anion gap: 9 (ref 5–15)
BUN: 17 mg/dL (ref 6–20)
CO2: 32 mmol/L (ref 22–32)
Calcium: 8.4 mg/dL — ABNORMAL LOW (ref 8.9–10.3)
Chloride: 91 mmol/L — ABNORMAL LOW (ref 98–111)
Creatinine, Ser: 1.52 mg/dL — ABNORMAL HIGH (ref 0.61–1.24)
GFR, Estimated: 57 mL/min — ABNORMAL LOW (ref >60)
Glucose, Bld: 220 mg/dL — ABNORMAL HIGH (ref 70–99)
Potassium: 3.9 mmol/L (ref 3.5–5.1)
Sodium: 132 mmol/L — ABNORMAL LOW (ref 135–145)
Total Bilirubin: 0.9 mg/dL (ref 0.3–1.2)
Total Protein: 7.6 g/dL (ref 6.5–8.1)

## 2023-02-28 LAB — LIPASE, BLOOD: Lipase: 31 U/L (ref 11–51)

## 2023-02-28 MED ORDER — SODIUM CHLORIDE 0.9 % IV BOLUS
500.0000 mL | Freq: Once | INTRAVENOUS | Status: AC
  Administered 2023-02-28: 500 mL via INTRAVENOUS

## 2023-02-28 MED ORDER — SODIUM CHLORIDE 0.9 % IV BOLUS: 1000.0000 mL | Freq: Once | INTRAVENOUS | Status: DC

## 2023-02-28 MED ORDER — HYDROMORPHONE HCL 1 MG/ML IJ SOLN
1.0000 mg | Freq: Once | INTRAMUSCULAR | Status: AC
  Administered 2023-02-28: 1 mg via INTRAVENOUS
  Filled 2023-02-28: qty 1

## 2023-02-28 MED ORDER — ONDANSETRON HCL 4 MG/2ML IJ SOLN
4.0000 mg | Freq: Once | INTRAMUSCULAR | Status: AC
  Administered 2023-02-28: 4 mg via INTRAVENOUS
  Filled 2023-02-28: qty 2

## 2023-02-28 MED ORDER — ONDANSETRON HCL 4 MG/2ML IJ SOLN
4.0000 mg | Freq: Four times a day (QID) | INTRAMUSCULAR | Status: DC | PRN
  Administered 2023-03-01: 4 mg via INTRAVENOUS
  Filled 2023-02-28: qty 2

## 2023-02-28 MED ORDER — HYDROMORPHONE HCL 1 MG/ML IJ SOLN
1.0000 mg | INTRAMUSCULAR | Status: DC | PRN
  Administered 2023-03-01: 1 mg via INTRAVENOUS
  Filled 2023-02-28: qty 1

## 2023-02-28 MED ORDER — MORPHINE SULFATE (PF) 2 MG/ML IV SOLN: 1.0000 mg | INTRAVENOUS | Status: DC | PRN

## 2023-02-28 MED ORDER — KETOROLAC TROMETHAMINE 15 MG/ML IJ SOLN
15.0000 mg | Freq: Once | INTRAMUSCULAR | Status: AC
  Administered 2023-02-28: 15 mg via INTRAVENOUS
  Filled 2023-02-28: qty 1

## 2023-02-28 NOTE — ED Provider Notes (Signed)
[place] [facility] AT [facility] Provider Note   CSN: [id number] Arrival date & time: 02/28/23  1817     History  Chief Complaint  Patient presents with   Vomiting   Flank Pain    Hurshel Rauschenberger is a 47 y.o. male.  The history is provided by the patient and medical records. No language interpreter was used.  Flank Pain     47 year old male significant history of COPD on 4 L of supplemental oxygen, diabetes, kidney disease, obesity, paroxysmal atrial fibrillation, CHF presenting complaining of left flank pain.  Patient report ongoing pain to his left flank for the past 5 days.  Pain is sharp stabbing radiates to his left testicle.  He also endorsed nausea and decreased p.o. intake.  He denies having fever or chills no chest pain or shortness of breath.  He has noticed blood in his urine.  He was seen in the ED 3 days ago for his complaint.  Workup remarkable for a 4mm obstructive kidney stone on the left ureter and patient subsequently discharged home with supportive care.  He mentions since being home, his symptoms has progressed and he is having significant amount of pain and discomfort and still urinating blood.  He has never had kidney stones in the past.  Home Medications Prior to Admission medications   Medication Sig Start Date End Date Taking? Authorizing Provider  ACCU-CHEK GUIDE test strip Use to monitor blood glucose once time daily 10/05/22 10/05/23  [provider]  Accu-Chek Softclix Lancets lancets See admin instructions. 10/05/22 10/05/23  [provider]  albuterol (PROVENTIL) (2.5 MG/3ML) 0.083% nebulizer solution Take 2.5 mg by nebulization every 4 (four) hours as needed for wheezing or shortness of breath. 11/26/19   [provider]  albuterol (VENTOLIN HFA) 108 (90 Base) MCG/ACT inhaler Inhale 2 puffs into the lungs every 6 (six) hours as needed for wheezing or shortness of breath. 02/20/19   Narda Bonds, MD   amLODipine (NORVASC) 5 MG tablet Take 5 mg by mouth daily. 11/21/22   [provider]  atorvastatin (LIPITOR) 20 MG tablet Take 20 mg by mouth daily.    [provider]  diltiazem (CARDIZEM CD) 120 MG 24 hr capsule Take 1 capsule (120 mg total) by mouth daily. 12/09/22   Kathleene Hazel, MD  famotidine (PEPCID) 20 MG tablet Take 1 tablet (20 mg total) by mouth 2 (two) times daily. Patient taking differently: Take 20 mg by mouth daily before breakfast. 02/17/19   Arby Barrette, MD  fluticasone (FLONASE) 50 MCG/ACT nasal spray Place 1-2 sprays into both nostrils daily as needed for rhinitis or allergies.    [provider]  fluticasone-salmeterol (ADVAIR HFA) 230-21 MCG/ACT inhaler Inhale 2 puffs into the lungs 2 (two) times daily.    [provider]  furosemide (LASIX) 20 MG tablet Take 1 tablet (20 mg total) by mouth 2 (two) times daily. Patient taking differently: Take 40 mg by mouth in the morning. 04/25/18   Richarda Overlie, MD  losartan (COZAAR) 100 MG tablet Take 100 mg by mouth daily. 10/08/19   [provider]  nicotine (NICODERM CQ - DOSED IN MG/24 HOURS) 21 mg/24hr patch Place 1 patch (21 mg total) onto the skin daily. 10/09/22   Dorcas Carrow, MD  omeprazole (PRILOSEC) 20 MG capsule Take 20 mg by mouth daily before breakfast. 10/02/19   [provider]  ondansetron (ZOFRAN-ODT) 4 MG disintegrating tablet Take 1 tablet (4 mg total) by mouth  every 8 (eight) hours as needed for nausea or vomiting. 02/26/23   Rondel Baton, MD  oxyCODONE (ROXICODONE) 5 MG immediate release tablet Take 1 tablet (5 mg total) by mouth every 4 (four) hours as needed for severe pain. 02/26/23   Rondel Baton, MD  OZEMPIC, 1 MG/DOSE, 4 MG/3ML SOPN Inject 1 mg into the skin every Sunday.    [provider]  tamsulosin (FLOMAX) 0.4 MG CAPS capsule Take 1 capsule (0.4 mg total) by mouth daily. 02/26/23 03/28/23  Rondel Baton, MD       Allergies    Acetaminophen and Shellfish allergy    Review of Systems   Review of Systems  Genitourinary:  Positive for flank pain.  All other systems reviewed and are negative.   Physical Exam Updated Vital Signs BP (!) 149/87 (BP Location: Left Arm)   Pulse 95   Temp 98.2 F (36.8 C) (Oral)   Resp 20   Wt 122 kg   SpO2 92%   BMI 40.90 kg/m  Physical Exam Vitals and nursing note reviewed.  Constitutional:      General: He is not in acute distress.    Appearance: He is well-developed. He is obese.     Comments: Wearing supplemental oxygen  HENT:     Head: Atraumatic.  Eyes:     Conjunctiva/sclera: Conjunctivae normal.  Cardiovascular:     Rate and Rhythm: Normal rate and regular rhythm.     Pulses: Normal pulses.     Heart sounds: Normal heart sounds.  Pulmonary:     Effort: Pulmonary effort is normal.     Comments: Decreased breath sounds throughout without wheezes rales or rhonchi Abdominal:     Palpations: Abdomen is soft.     Tenderness: There is no abdominal tenderness. There is left CVA tenderness. There is no right CVA tenderness.  Musculoskeletal:     Cervical back: Neck supple.  Skin:    Findings: No rash.  Neurological:     Mental Status: He is alert.     ED Results / Procedures / Treatments   Labs (all labs ordered are listed, but only abnormal results are displayed) Labs Reviewed  CBC WITH DIFFERENTIAL/PLATELET - Abnormal; Notable for the following components:      Result Value   WBC 18.6 (*)    Neutro Abs 14.9 (*)    Monocytes Absolute 1.3 (*)    Abs Immature Granulocytes 0.26 (*)    All other components within normal limits  COMPREHENSIVE METABOLIC PANEL - Abnormal; Notable for the following components:   Sodium 132 (*)    Chloride 91 (*)    Glucose, Bld 220 (*)    Creatinine, Ser 1.52 (*)    Calcium 8.4 (*)    AST 11 (*)    GFR, Estimated 57 (*)    All other components within normal limits  URINALYSIS, ROUTINE W REFLEX  MICROSCOPIC - Abnormal; Notable for the following components:   Hgb urine dipstick LARGE (*)    Bacteria, UA RARE (*)    All other components within normal limits  LIPASE, BLOOD    EKG None  Radiology CT ABDOMEN PELVIS W CONTRAST  Result Date: 02/26/2023 CLINICAL DATA:  Left lower back and flank pain, hematuria, nausea EXAM: CT ABDOMEN AND PELVIS WITH CONTRAST TECHNIQUE: Multidetector CT imaging of the abdomen and pelvis was performed using the standard protocol following bolus administration of intravenous contrast. RADIATION DOSE REDUCTION: This exam was performed according to the departmental dose-optimization program  which includes automated exposure control, adjustment of the mA and/or kV according to patient size and/or use of iterative reconstruction technique. CONTRAST:  OMNIPAQUE IOHEXOL 300 MG/ML  SOLN COMPARISON:  02/17/2019 FINDINGS: Lower chest: No acute pleural or parenchymal lung disease. Hepatobiliary: No focal liver abnormality is seen. No gallstones, gallbladder wall thickening, or biliary dilatation. Pancreas: Unremarkable. No pancreatic ductal dilatation or surrounding inflammatory changes. Spleen: Normal in size without focal abnormality. Adrenals/Urinary Tract: There is an obstructing 4 mm distal left ureteral calculus reference image 70/2, with moderate left hydronephrosis, left renal edema, and mild left perinephric fat stranding. The right kidney is unremarkable. The adrenals are grossly normal. The bladder is minimally distended, which limits its evaluation. Stomach/Bowel: No bowel obstruction or ileus. No bowel wall thickening or inflammatory change. Vascular/Lymphatic: Aortic atherosclerosis. Borderline enlarged lymph nodes are seen within the bilateral external iliac chains, nonspecific but stable since prior exam. Reproductive: Prostate is unremarkable. Other: No free fluid or free intraperitoneal gas. Small fat containing midline supraumbilical ventral hernia. No  bowel herniation. Musculoskeletal: No acute or destructive bony abnormalities. Reconstructed images demonstrate no additional findings. IMPRESSION: 1. Obstructing 4 mm distal left ureteral calculus, with moderate left-sided obstructive uropathy. 2.  Aortic Atherosclerosis (ICD10-I70.0). Electronically Signed   By: Sharlet Salina M.D.   On: 02/26/2023 22:18    Procedures .Critical Care  Performed by: Fayrene Helper, PA-C Authorized by: Fayrene Helper, PA-C   Critical care provider statement:    Critical care time (minutes):  30   Critical care was time spent personally by me on the following activities:  Development of treatment plan with patient or surrogate, discussions with consultants, evaluation of patient's response to treatment, examination of patient, ordering and review of laboratory studies, ordering and review of radiographic studies, ordering and performing treatments and interventions, pulse oximetry, re-evaluation of patient's condition and review of old charts     Medications Ordered in ED Medications - No data to display  ED Course/ Medical Decision Making/ A&P                             Medical Decision Making Amount and/or Complexity of Data Reviewed Labs: ordered. Radiology: ordered.  Risk Prescription drug management.   BP (!) 149/87 (BP Location: Left Arm)   Pulse 95   Temp 98.2 F (36.8 C) (Oral)   Resp 20   Wt 122 kg   SpO2 92%   BMI 40.90 kg/m   67:26 PM  47 year old male significant history of COPD on 4 L of supplemental oxygen, diabetes, kidney disease, obesity, paroxysmal atrial fibrillation, CHF presenting complaining of left flank pain.  Patient report ongoing pain to his left flank for the past 5 days.  Pain is sharp stabbing radiates to his left testicle.  He also endorsed nausea and decreased p.o. intake.  He denies having fever or chills no chest pain or shortness of breath.  He has noticed blood in his urine.  He was seen in the ED 3 days ago for his  complaint.  Workup remarkable for a 4mm obstructive kidney stone on the left ureter and patient subsequently discharged home with supportive care.  He mentions since being home, his symptoms has progressed and he is having significant amount of pain and discomfort and still urinating blood.  He has never had kidney stones in the past.  On exam, patient is sitting in bed in no acute respiratory discomfort however he is wearing his  supplemental oxygen at 4 L.  He does have left CVA tenderness on percussion but abdominal exam is unremarkable.  Vital signs overall reassuring.  EMR reviewed patient was seen and evaluated in the ED 3 days ago for similar presentation.  Workup remarkable for a 4 mm obstructive kidney stone in the distal left ureter.  His presentation today is suggestive of ongoing kidney stone causing pain.  He also endorsed nausea and cannot keep anything down.  He feels dehydrated.  9:52 PM -Labs ordered, independently viewed and interpreted by me.  Labs remarkable for UA showing large Hgb but no signs of UTI.  WBC elevated at 18.6, similar to prior. Cr. 1.52, it was 1.1 approximately 2 days ago. -The patient was maintained on a cardiac monitor.  I personally viewed and interpreted the cardiac monitored which showed an underlying rhythm of: NSR -Imaging independently viewed and interpreted by me and I agree with radiologist's interpretation.  Result remarkable for KUB showing presence of kidney stone at L UVJ -This patient presents to the ED for concern of flank pain, this involves an extensive number of treatment options, and is a complaint that carries with it a high risk of complications and morbidity.  The differential diagnosis includes obstructive kidney stone, infected kidney stone, dissection, MSK pain -Co morbidities that complicate the patient evaluation includes COPD, CHF, HTN, DM -Treatment includes multiple rounds of pain medication with some improvement, antiemetic, and  IVF -Reevaluation of the patient after these medicines showed that the patient improved -PCP office notes or outside notes reviewed -Discussion with specialist [facility] Dr. Cyndia Bent who agrees to admit pt.  Urologist Dr. Elon Spanner who will have urology to round on pt tomorrow morning  -Escalation to admission/observation considered: patient is agreeable with admission 10:07 PM Patient received multiple dose of pain medication without adequate relief of his symptoms.  He also received IV fluid as well.  I have consulted on-call urologist Dr. Marlou Porch who recommend obtaining a KUB to assess for stone, to give patient Toradol and if pain unrelieved patient can be admitted to medicine floor and urology to round on in the morning.        Final Clinical Impression(s) / ED Diagnoses Final diagnoses:  Calculus of ureterovesical junction (UVJ)  Dehydration    Rx / DC Orders ED Discharge Orders     None         Fayrene Helper, PA-C 02/28/23 2329    Cathren Laine, MD 03/01/23 1339

## 2023-02-28 NOTE — ED Triage Notes (Signed)
C/o left flank pain radiating into left abd and left testicle.  Pt reports diagnosed with kidney stone on 02/26/23.  Pt noted to be 77% on RA on arrival. Pt reports 4lpm [place] at night.  Hx COPD Pt reports improvement in hematuria but no improvement with n/v and pain

## 2023-02-28 NOTE — H&P (Addendum)
History and Physical    Patient: Steven Dudley:[id number] DOB: 03-Jan-1976 DOA: 02/28/2023 DOS: the patient was seen and examined on 03/01/2023 PCP: Macy Mis, MD  Patient coming from: Home  Chief Complaint:  Chief Complaint  Patient presents with   Vomiting   Flank Pain   HPI: Steven Dudley is a 47 y.o. male with medical history significant of HTN, COPD with chronic hypoxemic respiratory failure on 4L, OSA, paroxsymal atrial fibrillation , HFpEF morbid obesity who presents with left flank pain.   Pt was just evaluated at [facility] on 5/19 with left flank pain with CT Abd/pelvis revealing for obstructing left ureteral calculi with moderate left perinephric fat stranding. Negative UA. Pt was prescribed oxycodone and Flomax with instructions to follow up with urology.  However continued to have worsening pain despite pain medications. Has some nausea and vomiting. No fever. Hematuria is improving.   In the ED, he was afebrile, normotensive and on home baseline 4L O2 via [place].  Leukocytosis of 18.6. Hgb of 16.  AKI with creatinine of 1.52 from baseline around 0.9-1. CBG of 220  Abdominal X-ray showed obstructing 2-3 mm left UVJ stone. Negative UA.   Patient continues to have pain and nausea so hospitalist was consulted for continual management.  Urology Dr. Robby Sermon was consulted and recommends conservative treatment and can re-consult in the morning if pain uncontrolled.      Review of Systems: As mentioned in the history of present illness. All other systems reviewed and are negative. Past Medical History:  Diagnosis Date   Acid reflux    Acute respiratory failure (HCC)    Alcohol abuse    Anxiety    Asthma    daily and prn inhalers   CHF (congestive heart failure) (HCC)    Chondromalacia of right patella 09/2014   COPD (chronic obstructive pulmonary disease) (HCC)    Diabetes mellitus without complication (HCC)    TYPE II   Gastritis    History of  traumatic brain injury 2006   Hyperglycemia    Hypertension    states under control with med., has been on med. x 1 yr.   Meniscal cyst 09/2014   right knee   Morbid obesity (HCC)    Obesity    Renal disorder    Sleep apnea    Smokers' cough [facility])    Past Surgical History:  Procedure Laterality Date   APPENDECTOMY     FOREIGN BODY REMOVAL Left 02/27/2008   hand   INCISION AND DRAINAGE ABSCESS Left 03/01/2008   hand   IRRIGATION AND DEBRIDEMENT ABSCESS Left 02/27/2008   hand   KNEE ARTHROSCOPY WITH MEDIAL MENISECTOMY Right 09/25/2014   Procedure: RIGHT KNEE SCOPE CHONDROPLASTY/MEDIAL MENISCECTOMY;  Surgeon: Loreta Ave, MD;  Location: [facility];  Service: Orthopedics;  Laterality: Right;  ANESTHESIA: GENERAL, KNEE BLOCK   TENOSYNOVECTOMY Left 02/27/2008   flexor tenosynovectomy left hand   TONSILLECTOMY     TONSILLECTOMY AND ADENOIDECTOMY     WISDOM TOOTH EXTRACTION     Social History:  reports that he has been smoking cigarettes. He has a 52.50 pack-year smoking history. He has quit using smokeless tobacco. He reports current alcohol use. He reports current drug use. Drugs: Marijuana and IV.  Allergies  Allergen Reactions   Acetaminophen Other (See Comments)    GI BLEEDING   Shellfish Allergy Nausea And Vomiting, Nausea Only and Other (See Comments)    Headaches and hot flashes, also    Family History  Problem Relation Age of Onset   Stomach cancer Mother    Cancer Mother     Prior to Admission medications   Medication Sig Start Date End Date Taking? Authorizing Provider  ACCU-CHEK GUIDE test strip Use to monitor blood glucose once time daily 10/05/22 10/05/23  [provider]  Accu-Chek Softclix Lancets lancets See admin instructions. 10/05/22 10/05/23  [provider]  albuterol (PROVENTIL) (2.5 MG/3ML) 0.083% nebulizer solution Take 2.5 mg by nebulization every 4 (four) hours as needed for wheezing or shortness of breath. 11/26/19    [provider]  albuterol (VENTOLIN HFA) 108 (90 Base) MCG/ACT inhaler Inhale 2 puffs into the lungs every 6 (six) hours as needed for wheezing or shortness of breath. 02/20/19   Narda Bonds, MD  amLODipine (NORVASC) 5 MG tablet Take 5 mg by mouth daily. 11/21/22   [provider]  atorvastatin (LIPITOR) 20 MG tablet Take 20 mg by mouth daily.    [provider]  diltiazem (CARDIZEM CD) 120 MG 24 hr capsule Take 1 capsule (120 mg total) by mouth daily. 12/09/22   Kathleene Hazel, MD  famotidine (PEPCID) 20 MG tablet Take 1 tablet (20 mg total) by mouth 2 (two) times daily. Patient taking differently: Take 20 mg by mouth daily before breakfast. 02/17/19   Arby Barrette, MD  fluticasone (FLONASE) 50 MCG/ACT nasal spray Place 1-2 sprays into both nostrils daily as needed for rhinitis or allergies.    [provider]  fluticasone-salmeterol (ADVAIR HFA) 230-21 MCG/ACT inhaler Inhale 2 puffs into the lungs 2 (two) times daily.    [provider]  furosemide (LASIX) 20 MG tablet Take 1 tablet (20 mg total) by mouth 2 (two) times daily. Patient taking differently: Take 40 mg by mouth in the morning. 04/25/18   Richarda Overlie, MD  losartan (COZAAR) 100 MG tablet Take 100 mg by mouth daily. 10/08/19   [provider]  nicotine (NICODERM CQ - DOSED IN MG/24 HOURS) 21 mg/24hr patch Place 1 patch (21 mg total) onto the skin daily. 10/09/22   Dorcas Carrow, MD  omeprazole (PRILOSEC) 20 MG capsule Take 20 mg by mouth daily before breakfast. 10/02/19   [provider]  ondansetron (ZOFRAN-ODT) 4 MG disintegrating tablet Take 1 tablet (4 mg total) by mouth every 8 (eight) hours as needed for nausea or vomiting. 02/26/23   Rondel Baton, MD  oxyCODONE (ROXICODONE) 5 MG immediate release tablet Take 1 tablet (5 mg total) by mouth every 4 (four) hours as needed for severe pain. 02/26/23   Rondel Baton, MD  OZEMPIC, 1 MG/DOSE, 4 MG/3ML SOPN  Inject 1 mg into the skin every Sunday.    [provider]  tamsulosin (FLOMAX) 0.4 MG CAPS capsule Take 1 capsule (0.4 mg total) by mouth daily. 02/26/23 03/28/23  Rondel Baton, MD    Physical Exam: Vitals:   02/28/23 2100 02/28/23 2200 02/28/23 2258 02/28/23 2259  BP: 139/83 (!) 145/73    Pulse: 96 93    Resp:  18 18   Temp:    98 F (36.7 C)  TempSrc:    Oral  SpO2: (!) 89% (!) 89%    Weight:       Constitutional: NAD, calm, comfortable Eyes: PERRL, lids and conjunctivae normal ENMT: Mucous membranes are moist. Posterior pharynx clear of any exudate or lesions.Normal dentition.  Neck: normal, supple, no masses, no thyromegaly Respiratory: clear to auscultation bilaterally, no wheezing, no crackles. Normal respiratory effort. No accessory  muscle use.  Cardiovascular: Regular rate and rhythm, no murmurs / rubs / gallops. No extremity edema. 2+ pedal pulses. No carotid bruits.  Abdomen: no tenderness, no masses palpated. No hepatosplenomegaly. Bowel sounds positive.  Musculoskeletal: no clubbing / cyanosis. No joint deformity upper and lower extremities. Good ROM, no contractures. Normal muscle tone.  Skin: no rashes, lesions, ulcers. No induration Neurologic: CN 2-12 grossly intact. Sensation intact, DTR normal. Strength 5/5 in all 4.  Psychiatric: Normal judgment and insight. Alert and oriented x 3. Normal mood.   Data Reviewed:  See HPI   Assessment and Plan: * Calculus of ureterovesical junction (UVJ) -Abdominal X-ray showed obstructing 2-3 mm left UVJ stone. Negative UA and afebrile.  -Urology Dr. Robby Sermon was consulted by EDP and recommends pain management. If uncontrolled, can re-consult in the morning. -PRN IV opioids for pain -continue Flomax  OSA (obstructive sleep apnea) -continue Bipap  Chronic hypoxemic respiratory failure (HCC) -secondary to COPD -remains on his own 4L O2  Paroxysmal atrial fibrillation (HCC) -continue cardizem -taken off  Eliquis recently by cardiology after Zio patch monitoring  Obesity, Class III, BMI 40-49.9 (morbid obesity) (HCC) BMI of 40  AKI (acute kidney injury) (HCC) AKI with creatinine of 1.52 from baseline around 0.9-1. -post obstructive from kidney stone -keep on continuous IV fluids -avoid nephrotoxic agents  Diabetes (HCC) -with hyperglycemia -place on moderate SSI ACHS  COPD (chronic obstructive pulmonary disease) (HCC) -not in exacerbation  Hypertension -controlled -continue home antihypertensives      Advance Care Planning:   Code Status: Full Code   Consults: ED PA curbsided urology  Family Communication: none  Severity of Illness: The appropriate patient status for this patient is OBSERVATION. Observation status is judged to be reasonable and necessary in order to provide the required intensity of service to ensure the patient's safety. The patient's presenting symptoms, physical exam findings, and initial radiographic and laboratory data in the context of their medical condition is felt to place them at decreased risk for further clinical deterioration. Furthermore, it is anticipated that the patient will be medically stable for discharge from the hospital within 2 midnights of admission.   Author: Anselm Jungling, DO 03/01/2023 12:55 AM  For on call review [email or web address].

## 2023-02-28 NOTE — Assessment & Plan Note (Signed)
AKI with creatinine of 1.52 from baseline around 0.9-1. -post obstructive from kidney stone -keep on continuous IV fluids -avoid nephrotoxic agents

## 2023-02-28 NOTE — Assessment & Plan Note (Signed)
BMI of 40 

## 2023-03-01 ENCOUNTER — Encounter (HOSPITAL_COMMUNITY): Payer: Self-pay | Admitting: Family Medicine

## 2023-03-01 DIAGNOSIS — I110 Hypertensive heart disease with heart failure: Secondary | ICD-10-CM | POA: Diagnosis present

## 2023-03-01 DIAGNOSIS — E1165 Type 2 diabetes mellitus with hyperglycemia: Secondary | ICD-10-CM | POA: Diagnosis present

## 2023-03-01 DIAGNOSIS — G4730 Sleep apnea, unspecified: Secondary | ICD-10-CM | POA: Diagnosis present

## 2023-03-01 DIAGNOSIS — J9611 Chronic respiratory failure with hypoxia: Secondary | ICD-10-CM

## 2023-03-01 DIAGNOSIS — I480 Paroxysmal atrial fibrillation: Secondary | ICD-10-CM | POA: Diagnosis present

## 2023-03-01 DIAGNOSIS — N139 Obstructive and reflux uropathy, unspecified: Secondary | ICD-10-CM | POA: Diagnosis present

## 2023-03-01 DIAGNOSIS — Z886 Allergy status to analgesic agent status: Secondary | ICD-10-CM | POA: Diagnosis not present

## 2023-03-01 DIAGNOSIS — Z79899 Other long term (current) drug therapy: Secondary | ICD-10-CM | POA: Diagnosis not present

## 2023-03-01 DIAGNOSIS — Z87820 Personal history of traumatic brain injury: Secondary | ICD-10-CM | POA: Diagnosis not present

## 2023-03-01 DIAGNOSIS — Z7985 Long-term (current) use of injectable non-insulin antidiabetic drugs: Secondary | ICD-10-CM | POA: Diagnosis not present

## 2023-03-01 DIAGNOSIS — Z91013 Allergy to seafood: Secondary | ICD-10-CM | POA: Diagnosis not present

## 2023-03-01 DIAGNOSIS — N202 Calculus of kidney with calculus of ureter: Secondary | ICD-10-CM | POA: Diagnosis not present

## 2023-03-01 DIAGNOSIS — G4733 Obstructive sleep apnea (adult) (pediatric): Secondary | ICD-10-CM | POA: Diagnosis present

## 2023-03-01 DIAGNOSIS — K219 Gastro-esophageal reflux disease without esophagitis: Secondary | ICD-10-CM | POA: Diagnosis present

## 2023-03-01 DIAGNOSIS — F17210 Nicotine dependence, cigarettes, uncomplicated: Secondary | ICD-10-CM | POA: Diagnosis present

## 2023-03-01 DIAGNOSIS — N179 Acute kidney failure, unspecified: Secondary | ICD-10-CM | POA: Diagnosis present

## 2023-03-01 DIAGNOSIS — N201 Calculus of ureter: Secondary | ICD-10-CM | POA: Diagnosis not present

## 2023-03-01 DIAGNOSIS — I5032 Chronic diastolic (congestive) heart failure: Secondary | ICD-10-CM | POA: Diagnosis present

## 2023-03-01 DIAGNOSIS — D72829 Elevated white blood cell count, unspecified: Secondary | ICD-10-CM | POA: Diagnosis present

## 2023-03-01 DIAGNOSIS — Z9049 Acquired absence of other specified parts of digestive tract: Secondary | ICD-10-CM | POA: Diagnosis not present

## 2023-03-01 DIAGNOSIS — J4489 Other specified chronic obstructive pulmonary disease: Secondary | ICD-10-CM | POA: Diagnosis present

## 2023-03-01 DIAGNOSIS — Z7951 Long term (current) use of inhaled steroids: Secondary | ICD-10-CM | POA: Diagnosis not present

## 2023-03-01 DIAGNOSIS — E860 Dehydration: Secondary | ICD-10-CM | POA: Diagnosis not present

## 2023-03-01 DIAGNOSIS — Z6841 Body Mass Index (BMI) 40.0 and over, adult: Secondary | ICD-10-CM | POA: Diagnosis not present

## 2023-03-01 LAB — BASIC METABOLIC PANEL
Anion gap: 8 (ref 5–15)
BUN: 16 mg/dL (ref 6–20)
CO2: 32 mmol/L (ref 22–32)
Calcium: 7.8 mg/dL — ABNORMAL LOW (ref 8.9–10.3)
Chloride: 94 mmol/L — ABNORMAL LOW (ref 98–111)
Creatinine, Ser: 1.55 mg/dL — ABNORMAL HIGH (ref 0.61–1.24)
GFR, Estimated: 56 mL/min — ABNORMAL LOW (ref >60)
Glucose, Bld: 179 mg/dL — ABNORMAL HIGH (ref 70–99)
Potassium: 3.7 mmol/L (ref 3.5–5.1)
Sodium: 134 mmol/L — ABNORMAL LOW (ref 135–145)

## 2023-03-01 LAB — CBC
HCT: 45.3 % (ref 39.0–52.0)
Hemoglobin: 14.9 g/dL (ref 13.0–17.0)
MCH: 30.2 pg (ref 26.0–34.0)
MCHC: 32.9 g/dL (ref 30.0–36.0)
MCV: 91.9 fL (ref 80.0–100.0)
Platelets: 224 10*3/uL (ref 150–400)
RBC: 4.93 MIL/uL (ref 4.22–5.81)
RDW: 12.5 % (ref 11.5–15.5)
WBC: 15 10*3/uL — ABNORMAL HIGH (ref 4.0–10.5)
nRBC: 0 % (ref 0.0–0.2)

## 2023-03-01 LAB — GLUCOSE, CAPILLARY
Glucose-Capillary: 179 mg/dL — ABNORMAL HIGH (ref 70–99)
Glucose-Capillary: 177 mg/dL — ABNORMAL HIGH (ref 70–99)
Glucose-Capillary: 235 mg/dL — ABNORMAL HIGH (ref 70–99)
Glucose-Capillary: 113 mg/dL — ABNORMAL HIGH (ref 70–99)
Glucose-Capillary: 161 mg/dL — ABNORMAL HIGH (ref 70–99)

## 2023-03-01 LAB — CBG MONITORING, ED: Glucose-Capillary: 200 mg/dL — ABNORMAL HIGH (ref 70–99)

## 2023-03-01 MED ORDER — OXYCODONE HCL 5 MG PO TABS
10.0000 mg | ORAL_TABLET | ORAL | Status: DC | PRN
  Administered 2023-03-02 (×2): 10 mg via ORAL
  Filled 2023-03-01 (×2): qty 2

## 2023-03-01 MED ORDER — ENOXAPARIN SODIUM 60 MG/0.6ML IJ SOSY
60.0000 mg | PREFILLED_SYRINGE | INTRAMUSCULAR | Status: DC
  Administered 2023-03-01: 60 mg via SUBCUTANEOUS
  Filled 2023-03-01 (×3): qty 0.6

## 2023-03-01 MED ORDER — NICOTINE 21 MG/24HR TD PT24
21.0000 mg | MEDICATED_PATCH | Freq: Every day | TRANSDERMAL | Status: DC
  Administered 2023-03-01: 21 mg via TRANSDERMAL
  Filled 2023-03-01 (×2): qty 1

## 2023-03-01 MED ORDER — PANTOPRAZOLE SODIUM 40 MG PO TBEC
40.0000 mg | DELAYED_RELEASE_TABLET | Freq: Every day | ORAL | Status: DC
  Administered 2023-03-01 – 2023-03-02 (×2): 40 mg via ORAL
  Filled 2023-03-01 (×2): qty 1

## 2023-03-01 MED ORDER — AMLODIPINE BESYLATE 5 MG PO TABS
5.0000 mg | ORAL_TABLET | Freq: Every day | ORAL | Status: DC
  Administered 2023-03-01 – 2023-03-02 (×2): 5 mg via ORAL
  Filled 2023-03-01 (×2): qty 1

## 2023-03-01 MED ORDER — HYDRALAZINE HCL 25 MG PO TABS: 25.0000 mg | ORAL_TABLET | Freq: Four times a day (QID) | ORAL | Status: DC | PRN

## 2023-03-01 MED ORDER — PROMETHAZINE HCL 12.5 MG PO TABS
12.5000 mg | ORAL_TABLET | Freq: Four times a day (QID) | ORAL | Status: DC | PRN
  Administered 2023-03-01 (×2): 12.5 mg via ORAL
  Filled 2023-03-01 (×4): qty 1

## 2023-03-01 MED ORDER — TAMSULOSIN HCL 0.4 MG PO CAPS
0.4000 mg | ORAL_CAPSULE | Freq: Every day | ORAL | Status: DC
  Administered 2023-03-01 – 2023-03-02 (×2): 0.4 mg via ORAL
  Filled 2023-03-01 (×2): qty 1

## 2023-03-01 MED ORDER — SODIUM CHLORIDE 0.9 % IV SOLN
12.5000 mg | Freq: Once | INTRAVENOUS | Status: AC
  Administered 2023-03-01: 12.5 mg via INTRAVENOUS
  Filled 2023-03-01: qty 12.5

## 2023-03-01 MED ORDER — ATORVASTATIN CALCIUM 10 MG PO TABS
20.0000 mg | ORAL_TABLET | Freq: Every day | ORAL | Status: DC
  Administered 2023-03-01 – 2023-03-02 (×2): 20 mg via ORAL
  Filled 2023-03-01 (×2): qty 2

## 2023-03-01 MED ORDER — MOMETASONE FURO-FORMOTEROL FUM 200-5 MCG/ACT IN AERO
2.0000 | INHALATION_SPRAY | Freq: Two times a day (BID) | RESPIRATORY_TRACT | Status: DC
  Administered 2023-03-01 – 2023-03-02 (×2): 2 via RESPIRATORY_TRACT
  Filled 2023-03-01 (×2): qty 8.8

## 2023-03-01 MED ORDER — MORPHINE SULFATE (PF) 2 MG/ML IV SOLN
1.0000 mg | INTRAVENOUS | Status: DC | PRN
  Administered 2023-03-01: 1 mg via INTRAVENOUS
  Filled 2023-03-01: qty 1

## 2023-03-01 MED ORDER — IPRATROPIUM-ALBUTEROL 0.5-2.5 (3) MG/3ML IN SOLN: 3.0000 mL | RESPIRATORY_TRACT | Status: DC | PRN

## 2023-03-01 MED ORDER — FAMOTIDINE 20 MG PO TABS
20.0000 mg | ORAL_TABLET | Freq: Every day | ORAL | Status: DC
  Administered 2023-03-01 – 2023-03-02 (×2): 20 mg via ORAL
  Filled 2023-03-01 (×2): qty 1

## 2023-03-01 MED ORDER — INSULIN ASPART 100 UNIT/ML IJ SOLN
0.0000 [IU] | Freq: Three times a day (TID) | INTRAMUSCULAR | Status: DC
  Administered 2023-03-01: 3 [IU] via SUBCUTANEOUS
  Administered 2023-03-01: 5 [IU] via SUBCUTANEOUS
  Administered 2023-03-02: 3 [IU] via SUBCUTANEOUS
  Filled 2023-03-01: qty 0.15

## 2023-03-01 MED ORDER — DILTIAZEM HCL ER COATED BEADS 120 MG PO CP24
120.0000 mg | ORAL_CAPSULE | Freq: Every day | ORAL | Status: DC
  Administered 2023-03-01 – 2023-03-02 (×2): 120 mg via ORAL
  Filled 2023-03-01 (×2): qty 1

## 2023-03-01 MED ORDER — OXYCODONE HCL 5 MG PO TABS: 5.0000 mg | ORAL_TABLET | ORAL | Status: DC | PRN

## 2023-03-01 MED ORDER — KETOROLAC TROMETHAMINE 15 MG/ML IJ SOLN
15.0000 mg | Freq: Three times a day (TID) | INTRAMUSCULAR | Status: DC
  Administered 2023-03-01 – 2023-03-02 (×3): 15 mg via INTRAVENOUS
  Filled 2023-03-01 (×3): qty 1

## 2023-03-01 MED ORDER — OXYCODONE HCL 5 MG PO TABS: 10.0000 mg | ORAL_TABLET | ORAL | Status: DC | PRN

## 2023-03-01 MED ORDER — LACTATED RINGERS IV SOLN: INTRAVENOUS | Status: AC

## 2023-03-01 NOTE — Assessment & Plan Note (Signed)
-  secondary to COPD -remains on his own 4L O2

## 2023-03-01 NOTE — TOC Initial Note (Signed)
Transition of Care [facility]) - Initial/Assessment Note    Patient Details  Name: Steven Dudley MRN: [id number] Date of Birth: [birth year]  Transition of Care [facility]) CM/SW Contact:    Durenda Guthrie, RN Phone Number: 03/01/2023, 9:56 AM  Clinical Narrative:                  Transition of Care [facility]) Department has reviewed patient and no TOC needs have been identified at this time. We will continue to monitor patient advancement through Interdisciplinary progressions and if new patient needs arise, please place a consult.       Patient Goals and CMS Choice            Expected Discharge Plan and Services                                              Prior Living Arrangements/Services                       Activities of Daily Living      Permission Sought/Granted                  Emotional Assessment              Admission diagnosis:  Dehydration [E86.0] Calculus of ureterovesical junction (UVJ) [N20.1] Patient Active Problem List   Diagnosis Date Noted   Paroxysmal atrial fibrillation (HCC) 03/01/2023   Chronic hypoxemic respiratory failure (HCC) 03/01/2023   OSA (obstructive sleep apnea) 03/01/2023   AKI (acute kidney injury) (HCC) 02/28/2023   Calculus of ureterovesical junction (UVJ) 02/28/2023   Obesity, Class III, BMI 40-49.9 (morbid obesity) (HCC) 02/28/2023   Paroxysmal atrial fibrillation with RVR (HCC) 10/04/2022   RSV (respiratory syncytial virus pneumonia) 10/04/2022   Chronic diastolic CHF (congestive heart failure) (HCC) 10/04/2022   Alcohol abuse 10/04/2022   Tobacco abuse 10/04/2022   Acute respiratory failure with hypercapnia (HCC) 11/29/2019   COPD with acute exacerbation (HCC) 02/18/2019   Acute exacerbation of chronic obstructive pulmonary disease (COPD) (HCC) 04/22/2018   Diabetes mellitus type 2 in obese 04/22/2018   Atypical pneumonia    Diabetes (HCC) 01/11/2016   Prediabetes 09/17/2015   URI (upper  respiratory infection) 09/14/2015   Chest pain 09/14/2015   Cough 09/14/2015   Acute on chronic respiratory failure with hypoxia and hypercapnia (HCC) 09/14/2015   COPD exacerbation (HCC) 09/14/2015   Tobacco use disorder 09/14/2015   Tachycardia 09/14/2015   Hyperglycemia 09/14/2015   Acid reflux    Obesity    Hypertension    COPD (chronic obstructive pulmonary disease) (HCC)    PCP:  Macy Mis, MD Pharmacy:   [facility] [address] [place], [place] - [address] [place] [address] Phone: [phone] Fax: [phone]     Social Determinants of Health (SDOH) Social History: SDOH Screenings   Food Insecurity: No Food Insecurity (10/04/2022)  Housing: Low Risk  (10/04/2022)  Transportation Needs: No Transportation Needs (10/04/2022)  Utilities: Not At Risk (10/04/2022)  Tobacco Use: High Risk (02/26/2023)   SDOH Interventions:     Readmission Risk Interventions     No data to display

## 2023-03-01 NOTE — Progress Notes (Signed)
   03/01/23 2109  BiPAP/CPAP/SIPAP  BiPAP/CPAP/SIPAP Pt Type Adult  BiPAP/CPAP/SIPAP DREAMSTATIOND  Mask Type Full face mask  Mask Size Large  Flow Rate 5 lpm  Patient Home Equipment No  Auto Titrate Yes (Imax 20 Emin 5 PS 4-8)  CPAP/SIPAP surface wiped down Yes  BiPAP/CPAP /SiPAP Vitals  Pulse Rate 96  Resp 20  SpO2 92 %  Bilateral Breath Sounds Clear;Diminished  MEWS Score/Color  MEWS Score 0  MEWS Score Color Steven Dudley

## 2023-03-01 NOTE — Assessment & Plan Note (Signed)
-  continue Bipap

## 2023-03-01 NOTE — Assessment & Plan Note (Signed)
-  with hyperglycemia -place on moderate SSI ACHS

## 2023-03-01 NOTE — Progress Notes (Signed)
PROGRESS NOTE    Briden Kammer  ZOX:[id number] DOB: [birth year] DOA: 02/28/2023 PCP: Macy Mis, MD    Brief Narrative:   Steven Dudley is a 47 y.o. male with past medical history significant for COPD/chronic hypoxic respiratory failure on 4 L nasal cannula, obstructive sleep apnea, paroxysmal atrial fibrillation, chronic diastolic congestive heart failure, morbid obesity who presented to [facility] ED on 5/21 with persistent left flank pain radiating to his left abdomen/testicle.    Recently seen at [facility] on 5/19 with similar symptoms with CT abdomen/pelvis revealing obstructing left ureteral calculi with moderate left perinephric fat stranding.  Patient was prescribed oxycodone and Flomax with instructions to follow-up with urology.  But despite pain medication, continued with nausea, vomiting and worsening pain.  In the ED, WBC 18.6, hemoglobin 16.3, platelets 271.  Sodium 132, potassium 3.9, chloride 91, CO2 32, glucose 220, BUN 17, creatinine 1.52.  Lipase 31, AST 11, ALT 12, total bilirubin 0.9.  Urinalysis with large hemoglobin, otherwise unrevealing.  Abdominal x-ray with findings suggestive of obstructing 2-3 mm left UVJ stone.  EDP consulted urology, Dr. Robby Sermon who recommended conservative treatment and to reconsult in the morning if pain uncontrolled.  TRH consulted for admission.  Assessment & Plan:   Obstructive uropathy secondary to UVJ nephrolithiasis Patient presenting to ED with continued pain to left flank/testicle.  Recently diagnosed with obstructing left 4 mm UVJ stone on 5/19.  Initially discharged home on pain medication and tamsulosin; but symptoms continue to be persistent.  Abdominal x-ray showing obstructing 2-3 mm left UVJ stone.  Patient is afebrile, urinalysis unrevealing. -- Toradol 15 mg IV every 8 hours -- Oxycodone 10 mg p.o. every 4 hours as needed moderate pain -- Morphine 1 mg IV every 3 hours as needed severe pain --  Tamsulosin 0.4 mg p.o. daily -- LR at 100 mL/h  COPD Chronic hypoxic respiratory failure on 4 L nasal cannula at baseline -- Dulera 2 puffs twice daily (substituted for home Advair) -- DuoNebs q4h PRN shortness of breath/wheezing  Acute renal failure Creatinine 1.52 on admission, baseline 0.9- 1.1.  Etiology likely secondary to obstructive uropathy -- Cr 1.52>1.55 -- LR at 100 mL/h -- Holding furosemide/losartan -- Repeat BMP in a.m.  Essential hypertension Paroxysmal atrial fibrillation Chronic diastolic congestive heart failure -- Diltiazem 120 mg p.o. daily --Amlodipine 5 mg p.o. daily -- Hydralazine 25mg  PO q6h PRN SBP >165 -- Holding home Lasix/losartan for now for AKI -- Recently discontinued from Eliquis after Zio patch monitoring by cardiology  Type 2 diabetes mellitus Hemoglobin A1c 8.9 10/04/2022.  On Ozempic outpatient. -- SSI for coverage -- CBGs qAC/HS  OSA -- Continue nocturnal CPAP/BiPAP  GERD -- Protonix 40 mg p.o. daily, Pepcid 20 mg p.o. daily  Tobacco use disorder Counseled on need for complete cessation/abstinence -- Nicotine patch  Morbid obesity Body mass index is 40.9 kg/m.  Discussed with patient needs for aggressive lifestyle changes/weight loss as this complicates all facets of care.  Outpatient follow-up with PCP.     DVT prophylaxis: SCDs    Code Status: Full Code Family Communication: No family present at bedside this morning  Disposition Plan:  Level of care: Telemetry Status is: Inpatient Remains inpatient appropriate because: Pain control, IV fluid hydration   Consultants:  None  Procedures:  None  Antimicrobials:  None   Subjective: Patient seen examined bedside, resting calmly.  Remains in ED holding area.  Reports pain is slightly improved.  Continues with occasional nausea.  Reports that  his oxygen saturation typically is low, does not wear oxygen during the day and only nocturnally with his home BiPAP/CPAP unit.   No other questions or concerns at this time.  Denies headache, no dizziness, no chest pain, no shortness of breath greater than his typical baseline, no fever/chills/night sweats, no vomiting/diarrhea, no focal weakness, no fatigue, no paresthesias.  No acute events overnight per nurse staff.  Objective: Vitals:   03/01/23 0700 03/01/23 0730 03/01/23 0744 03/01/23 0923  BP: 130/70 133/75 133/75 118/82  Pulse: 77 83 76 84  Resp:  20 20 18   Temp:  (!) 97.5 F (36.4 C) (!) 97.5 F (36.4 C) 98.3 F (36.8 C)  TempSrc:  Oral Oral Oral  SpO2: (!) 86% 91% 94% 91%  Weight:        Intake/Output Summary (Last 24 hours) at 03/01/2023 1152 Last data filed at 03/01/2023 0115 Gross per 24 hour  Intake 1050 ml  Output --  Net 1050 ml   Filed Weights   02/28/23 1828  Weight: 122 kg    Examination:  Physical Exam: GEN: NAD, alert and oriented x 3, obese HEENT: NCAT, PERRL, EOMI, sclera clear, MMM PULM: Breath sounds slight diminished bilateral bases, no wheezing, normal Respaire effort without accessory muscle use, on BiPAP CV: RRR w/o M/G/R GI: abd soft, NTND, NABS, no R/G/M MSK: no peripheral edema, moves all extremity dependently NEURO: CN II-XII intact, no focal deficits, sensation to light touch intact PSYCH: normal mood/affect Integumentary: No concerning rashes/lesions/wounds noted on exposed skin surfaces.    Data Reviewed: I have personally reviewed following labs and imaging studies  CBC: Recent Labs  Lab 02/26/23 2020 02/28/23 1920 03/01/23 0320  WBC 18.2* 18.6* 15.0*  NEUTROABS 14.1* 14.9*  --   HGB 17.1* 16.3 14.9  HCT 50.2 48.5 45.3  MCV 88.2 89.5 91.9  PLT 299 271 224   Basic Metabolic Panel: Recent Labs  Lab 02/26/23 2020 02/28/23 1920 03/01/23 0320  NA 136 132* 134*  K 3.6 3.9 3.7  CL 95* 91* 94*  CO2 28 32 32  GLUCOSE 250* 220* 179*  BUN 12 17 16   CREATININE 1.10 1.52* 1.55*  CALCIUM 8.8* 8.4* 7.8*   GFR: Estimated Creatinine Clearance: 76.4  mL/min (A) (by C-G formula based on SCr of 1.55 mg/dL (H)). Liver Function Tests: Recent Labs  Lab 02/26/23 2020 02/28/23 1920  AST 16 11*  ALT 14 12  ALKPHOS 82 65  BILITOT 0.6 0.9  PROT 7.6 7.6  ALBUMIN 4.1 3.9   Recent Labs  Lab 02/26/23 2020 02/28/23 1920  LIPASE 37 31   No results for input(s): "AMMONIA" in the last 168 hours. Coagulation Profile: No results for input(s): "INR", "PROTIME" in the last 168 hours. Cardiac Enzymes: No results for input(s): "CKTOTAL", "CKMB", "CKMBINDEX", "TROPONINI" in the last 168 hours. BNP (last 3 results) No results for input(s): "PROBNP" in the last 8760 hours. HbA1C: No results for input(s): "HGBA1C" in the last 72 hours. CBG: Recent Labs  Lab 03/01/23 0831 03/01/23 1139  GLUCAP 200* 179*   Lipid Profile: No results for input(s): "CHOL", "HDL", "LDLCALC", "TRIG", "CHOLHDL", "LDLDIRECT" in the last 72 hours. Thyroid Function Tests: No results for input(s): "TSH", "T4TOTAL", "FREET4", "T3FREE", "THYROIDAB" in the last 72 hours. Anemia Panel: No results for input(s): "VITAMINB12", "FOLATE", "FERRITIN", "TIBC", "IRON", "RETICCTPCT" in the last 72 hours. Sepsis Labs: No results for input(s): "PROCALCITON", "LATICACIDVEN" in the last 168 hours.  No results found for this or any previous visit (from the  past 240 hour(s)).       Radiology Studies: DG Abdomen 1 View  Result Date: 02/28/2023 CLINICAL DATA:  Kidney stone, left flank pain. Diagnosed with ureteral stone 519 with increased pain tonight EXAM: ABDOMEN - 1 VIEW COMPARISON:  CT abdomen and pelvis 02/26/2023 FINDINGS: Column of contrast within the left ureter and left renal collecting system. There is termination of the contrast in the distal ureter near the UVJ. Possible 2-3 mm stone at the left UVJ versus pelvic phlebolith. Nonobstructive bowel-gas pattern. No acute osseous abnormality. IMPRESSION: Findings suggestive of obstructing 2-3 mm left UVJ stone. Electronically  Signed   By: Minerva Fester M.D.   On: 02/28/2023 22:33        Scheduled Meds:  amLODipine  5 mg Oral Daily   atorvastatin  20 mg Oral Daily   diltiazem  120 mg Oral Daily   enoxaparin (LOVENOX) injection  60 mg Subcutaneous Q24H   famotidine  20 mg Oral QAC breakfast   insulin aspart  0-15 Units Subcutaneous TID PC & HS   ketorolac  15 mg Intravenous Q8H   mometasone-formoterol  2 puff Inhalation BID   nicotine  21 mg Transdermal Daily   pantoprazole  40 mg Oral Daily   tamsulosin  0.4 mg Oral Daily   Continuous Infusions:  lactated ringers 100 mL/hr at 03/01/23 0316     LOS: 0 days    Time spent: 56 minutes spent on chart review, discussion with nursing staff, consultants, updating family and interview/physical exam; more than 50% of that time was spent in counseling and/or coordination of care.    Alvira Philips [place], DO [facility] Available via Epic secure chat 7am-7pm After these hours, please refer to coverage provider listed on [email or web address] 03/01/2023, 11:52 AM

## 2023-03-01 NOTE — ED Notes (Signed)
ED TO INPATIENT HANDOFF REPORT  Name/Age/Gender Steven Dudley 47 y.o. male  Code Status    Code Status Orders  (From admission, onward)           Start     Ordered   03/01/23 0048  Full code  Continuous       Question:  By:  Answer:  Consent: discussion documented in EHR   03/01/23 0047           Code Status History     Date Active Date Inactive Code Status Order ID Comments User Context   10/04/2022 1354 10/08/2022 1741 Full Code [id number]  Noralee Stain, DO Inpatient   11/29/2019 1410 11/30/2019 1509 Full Code [id number]  Narda Bonds, MD Inpatient   02/18/2019 1941 02/20/2019 1733 Full Code [id number]  Hillary Bow, DO ED   04/22/2018 2043 04/25/2018 2005 Full Code [id number]  Eduard Clos, MD ED   09/14/2015 1558 09/17/2015 1629 Full Code [id number]  Gwenyth Bender, NP Inpatient   09/25/2014 0953 09/25/2014 1510 Full Code [id number]  Arrie Eastern, PA-C Inpatient       Home/SNF/Other Home  Chief Complaint Calculus of ureterovesical junction (UVJ) [N20.1]  Level of Care/Admitting Diagnosis ED Disposition     ED Disposition  Admit   Condition  --   Comment  Hospital Area: [facility] [[id number]]  Level of Care: Telemetry [5]  Admit to tele based on following criteria: Other see comments  Comments: RATE  May place patient in observation at [facility] or [facility] if equivalent level of care is available:: No  Covid Evaluation: Asymptomatic - no recent exposure (last 10 days) testing not required  Diagnosis: Calculus of ureterovesical junction (UVJ) [5366440]  Admitting Physician: Anselm Jungling [[id number]]  Attending Physician: Anselm Jungling [[id number]]          Medical History Past Medical History:  Diagnosis Date   Acid reflux    Acute respiratory failure (HCC)    Alcohol abuse    Anxiety    Asthma    daily and prn inhalers   CHF (congestive heart failure) (HCC)    Chondromalacia of right patella 09/2014   COPD (chronic  obstructive pulmonary disease) (HCC)    Diabetes mellitus without complication (HCC)    TYPE II   Gastritis    History of traumatic brain injury 2006   Hyperglycemia    Hypertension    states under control with med., has been on med. x 1 yr.   Meniscal cyst 09/2014   right knee   Morbid obesity (HCC)    Obesity    Renal disorder    Sleep apnea    Smokers' cough (HCC)     Allergies Allergies  Allergen Reactions   Acetaminophen Other (See Comments)    GI BLEEDING   Shellfish Allergy Nausea And Vomiting, Nausea Only and Other (See Comments)    Headaches and hot flashes, also    IV Location/Drains/Wounds Patient Lines/Drains/Airways Status     Active Line/Drains/Airways     Name Placement date Placement time Site Days   Peripheral IV 02/26/23 Anterior;Distal;Left;Upper Arm 02/26/23  2134  Arm  3   Peripheral IV 02/28/23 20 G Anterior;Right Antecubital 02/28/23  1915  Antecubital  1            Labs/Imaging Results for orders placed or performed during the hospital encounter of 02/28/23 (from the past 48 hour(s))  CBC with Differential     Status: Abnormal  Collection Time: 02/28/23  7:20 PM  Result Value Ref Range   WBC 18.6 (H) 4.0 - 10.5 K/uL   RBC 5.42 4.22 - 5.81 MIL/uL   Hemoglobin 16.3 13.0 - 17.0 g/dL   HCT 56.4 33.2 - 95.1 %   MCV 89.5 80.0 - 100.0 fL   MCH 30.1 26.0 - 34.0 pg   MCHC 33.6 30.0 - 36.0 g/dL   RDW 88.4 16.6 - 06.3 %   Platelets 271 150 - 400 K/uL   nRBC 0.0 0.0 - 0.2 %   Neutrophils Relative % 83 %    Comment: REPEATED TO VERIFY Predominantly Neutrophils Seen    Neutro Abs 14.9 (H) 1.7 - 7.7 K/uL   Lymphocytes Relative 9 %   Lymphs Abs 1.6 0.7 - 4.0 K/uL   Monocytes Relative 7 %   Monocytes Absolute 1.3 (H) 0.1 - 1.0 K/uL   Eosinophils Relative 0 %   Eosinophils Absolute 0.0 0.0 - 0.5 K/uL   Basophils Relative 0 %   Basophils Absolute 0.1 0.0 - 0.1 K/uL   Immature Granulocytes 1 %   Abs Immature Granulocytes 0.26 (H) 0.00 - 0.07  K/uL    Comment: Performed at [facility], [address], [place], [place] [address]  Comprehensive metabolic panel     Status: Abnormal   Collection Time: 02/28/23  7:20 PM  Result Value Ref Range   Sodium 132 (L) 135 - 145 mmol/L   Potassium 3.9 3.5 - 5.1 mmol/L   Chloride 91 (L) 98 - 111 mmol/L   CO2 32 22 - 32 mmol/L   Glucose, Bld 220 (H) 70 - 99 mg/dL    Comment: Glucose reference range applies only to samples taken after fasting for at least 8 hours.   BUN 17 6 - 20 mg/dL   Creatinine, Ser 0.93 (H) 0.61 - 1.24 mg/dL   Calcium 8.4 (L) 8.9 - 10.3 mg/dL   Total Protein 7.6 6.5 - 8.1 g/dL   Albumin 3.9 3.5 - 5.0 g/dL   AST 11 (L) 15 - 41 U/L   ALT 12 0 - 44 U/L   Alkaline Phosphatase 65 38 - 126 U/L   Total Bilirubin 0.9 0.3 - 1.2 mg/dL   GFR, Estimated 57 (L) >60 mL/min    Comment: (NOTE) Calculated using the CKD-EPI Creatinine Equation (2021)    Anion gap 9 5 - 15    Comment: Performed at [facility], [address], [place], [place] [address]  Lipase, blood     Status: None   Collection Time: 02/28/23  7:20 PM  Result Value Ref Range   Lipase 31 11 - 51 U/L    Comment: Performed at [facility], [address], [place], [place] [address]  Urinalysis, Routine w reflex microscopic -Urine, Clean Catch     Status: Abnormal   Collection Time: 02/28/23  9:05 PM  Result Value Ref Range   Color, Urine YELLOW YELLOW   APPearance CLEAR CLEAR   Specific Gravity, Urine 1.010 1.005 - 1.030   pH 6.0 5.0 - 8.0   Glucose, UA NEGATIVE NEGATIVE mg/dL   Hgb urine dipstick LARGE (A) NEGATIVE   Bilirubin Urine NEGATIVE NEGATIVE   Ketones, ur NEGATIVE NEGATIVE mg/dL   Protein, ur NEGATIVE NEGATIVE mg/dL   Nitrite NEGATIVE NEGATIVE   Leukocytes,Ua NEGATIVE NEGATIVE   RBC / HPF 21-50 0 - 5 RBC/hpf   WBC, UA 0-5 0 - 5 WBC/hpf   Bacteria, UA RARE (A) NONE  SEEN   Squamous Epithelial / HPF 0-5 0 - 5 /HPF   Mucus PRESENT      Comment: Performed at [facility], [address], [place], [place] [address]  CBC     Status: Abnormal   Collection Time: 03/01/23  3:20 AM  Result Value Ref Range   WBC 15.0 (H) 4.0 - 10.5 K/uL   RBC 4.93 4.22 - 5.81 MIL/uL   Hemoglobin 14.9 13.0 - 17.0 g/dL   HCT 60.4 54.0 - 98.1 %   MCV 91.9 80.0 - 100.0 fL   MCH 30.2 26.0 - 34.0 pg   MCHC 32.9 30.0 - 36.0 g/dL   RDW 19.1 47.8 - 29.5 %   Platelets 224 150 - 400 K/uL   nRBC 0.0 0.0 - 0.2 %    Comment: Performed at [facility], [address], [place], [place] [address]  Basic metabolic panel     Status: Abnormal   Collection Time: 03/01/23  3:20 AM  Result Value Ref Range   Sodium 134 (L) 135 - 145 mmol/L   Potassium 3.7 3.5 - 5.1 mmol/L   Chloride 94 (L) 98 - 111 mmol/L   CO2 32 22 - 32 mmol/L   Glucose, Bld 179 (H) 70 - 99 mg/dL    Comment: Glucose reference range applies only to samples taken after fasting for at least 8 hours.   BUN 16 6 - 20 mg/dL   Creatinine, Ser 8.65 (H) 0.61 - 1.24 mg/dL   Calcium 7.8 (L) 8.9 - 10.3 mg/dL   GFR, Estimated 56 (L) >60 mL/min    Comment: (NOTE) Calculated using the CKD-EPI Creatinine Equation (2021)    Anion gap 8 5 - 15    Comment: Performed at [facility], [address], [place], [place] [address]   DG Abdomen 1 View  Result Date: 02/28/2023 CLINICAL DATA:  Kidney stone, left flank pain. Diagnosed with ureteral stone 519 with increased pain tonight EXAM: ABDOMEN - 1 VIEW COMPARISON:  CT abdomen and pelvis 02/26/2023 FINDINGS: Column of contrast within the left ureter and left renal collecting system. There is termination of the contrast in the distal ureter near the UVJ. Possible 2-3 mm stone at the left UVJ versus pelvic phlebolith. Nonobstructive bowel-gas pattern. No acute osseous abnormality. IMPRESSION: Findings suggestive of obstructing 2-3 mm left UVJ stone. Electronically Signed   By: Minerva Fester M.D.   On:  02/28/2023 22:33    Pending Labs Unresulted Labs (From admission, onward)    None       Vitals/Pain Today's Vitals   03/01/23 0559 03/01/23 0630 03/01/23 0700 03/01/23 0730  BP: 123/64 (!) 118/56 130/70 133/75  Pulse: 73 82 77 83  Resp: (!) 22     Temp:      TempSrc:      SpO2: 91% 90% (!) 86% 91%  Weight:      PainSc:        Isolation Precautions No active isolations  Medications Medications  ondansetron (ZOFRAN) injection 4 mg (has no administration in time range)  morphine (PF) 2 MG/ML injection 1 mg (has no administration in time range)  HYDROmorphone (DILAUDID) injection 1 mg (has no administration in time range)  amLODipine (NORVASC) tablet 5 mg (has no administration in time range)  atorvastatin (LIPITOR) tablet 20 mg (has no administration in time range)  diltiazem (CARDIZEM CD) 24 hr capsule 120 mg (has no administration in time range)  nicotine (NICODERM CQ - dosed in  mg/24 hours) patch 21 mg (has no administration in time range)  famotidine (PEPCID) tablet 20 mg (20 mg Oral Given 03/01/23 0734)  pantoprazole (PROTONIX) EC tablet 40 mg (has no administration in time range)  tamsulosin (FLOMAX) capsule 0.4 mg (has no administration in time range)  mometasone-formoterol (DULERA) 200-5 MCG/ACT inhaler 2 puff (2 puffs Inhalation Not Given 03/01/23 0553)  oxyCODONE (Oxy IR/ROXICODONE) immediate release tablet 5 mg (has no administration in time range)  lactated ringers infusion ( Intravenous New Bag/Given 03/01/23 0316)  enoxaparin (LOVENOX) injection 60 mg (has no administration in time range)  insulin aspart (novoLOG) injection 0-15 Units (has no administration in time range)  ondansetron (ZOFRAN) injection 4 mg (4 mg Intravenous Given 02/28/23 1920)  HYDROmorphone (DILAUDID) injection 1 mg (1 mg Intravenous Given 02/28/23 1917)  sodium chloride 0.9 % bolus 500 mL (0 mLs Intravenous Stopped 02/28/23 2000)  HYDROmorphone (DILAUDID) injection 1 mg (1 mg Intravenous Given  02/28/23 2119)  sodium chloride 0.9 % bolus 500 mL (0 mLs Intravenous Stopped 02/28/23 2252)  ketorolac (TORADOL) 15 MG/ML injection 15 mg (15 mg Intravenous Given 02/28/23 2255)  promethazine (PHENERGAN) 12.5 mg in sodium chloride 0.9 % 50 mL IVPB (0 mg Intravenous Stopped 03/01/23 0115)    Mobility walks

## 2023-03-01 NOTE — Assessment & Plan Note (Signed)
-  continue cardizem -taken off Eliquis recently by cardiology after Zio patch monitoring

## 2023-03-01 NOTE — Assessment & Plan Note (Signed)
-  controlled -continue home antihypertensives

## 2023-03-01 NOTE — Assessment & Plan Note (Addendum)
-  Abdominal X-ray showed obstructing 2-3 mm left UVJ stone. Negative UA and afebrile.  -Urology Dr. Robby Sermon was consulted by EDP and recommends pain management. If uncontrolled, can re-consult in the morning. -PRN IV opioids for pain -continue Flomax

## 2023-03-01 NOTE — ED Notes (Signed)
Oxygen level dropped to 71% talked to respiratory he advised to turn oxygen flow meter up, will continue to monitor.

## 2023-03-01 NOTE — Assessment & Plan Note (Signed)
-  not in exacerbation 

## 2023-03-02 ENCOUNTER — Inpatient Hospital Stay (HOSPITAL_COMMUNITY): Payer: Medicaid Other

## 2023-03-02 DIAGNOSIS — N201 Calculus of ureter: Secondary | ICD-10-CM | POA: Diagnosis not present

## 2023-03-02 LAB — CBC
HCT: 44.3 % (ref 39.0–52.0)
Hemoglobin: 14.1 g/dL (ref 13.0–17.0)
MCH: 29.6 pg (ref 26.0–34.0)
MCHC: 31.8 g/dL (ref 30.0–36.0)
MCV: 93.1 fL (ref 80.0–100.0)
Platelets: 233 10*3/uL (ref 150–400)
RBC: 4.76 MIL/uL (ref 4.22–5.81)
RDW: 12.4 % (ref 11.5–15.5)
WBC: 10.4 10*3/uL (ref 4.0–10.5)
nRBC: 0 % (ref 0.0–0.2)

## 2023-03-02 LAB — BASIC METABOLIC PANEL
Anion gap: 10 (ref 5–15)
BUN: 14 mg/dL (ref 6–20)
CO2: 31 mmol/L (ref 22–32)
Calcium: 8 mg/dL — ABNORMAL LOW (ref 8.9–10.3)
Chloride: 94 mmol/L — ABNORMAL LOW (ref 98–111)
Creatinine, Ser: 1.39 mg/dL — ABNORMAL HIGH (ref 0.61–1.24)
GFR, Estimated: >60 mL/min (ref >60)
Glucose, Bld: 156 mg/dL — ABNORMAL HIGH (ref 70–99)
Potassium: 3.7 mmol/L (ref 3.5–5.1)
Sodium: 135 mmol/L (ref 135–145)

## 2023-03-02 LAB — GLUCOSE, CAPILLARY: Glucose-Capillary: 151 mg/dL — ABNORMAL HIGH (ref 70–99)

## 2023-03-02 MED ORDER — OXYCODONE HCL 5 MG PO TABS: 10.0000 mg | ORAL_TABLET | Freq: Four times a day (QID) | ORAL | 0 refills | Status: AC | PRN

## 2023-03-02 MED ORDER — PROMETHAZINE HCL 12.5 MG PO TABS: 12.5000 mg | ORAL_TABLET | Freq: Four times a day (QID) | ORAL | 0 refills | Status: AC | PRN

## 2023-03-02 MED ORDER — SODIUM CHLORIDE 0.9 % IV SOLN: INTRAVENOUS | Status: DC

## 2023-03-02 MED ORDER — TAMSULOSIN HCL 0.4 MG PO CAPS: 0.4000 mg | ORAL_CAPSULE | Freq: Every day | ORAL | 0 refills | Status: AC

## 2023-03-02 MED ORDER — KETOROLAC TROMETHAMINE 10 MG PO TABS: 10.0000 mg | ORAL_TABLET | Freq: Three times a day (TID) | ORAL | 0 refills | Status: AC

## 2023-03-02 NOTE — Discharge Summary (Signed)
Physician Discharge Summary  Nacho Dice ZOX:[id number] DOB: 18-Dec-1975 DOA: 02/28/2023  PCP: Macy Mis, MD  Admit date: 02/28/2023 Discharge date: 03/02/2023  Admitted From: Home Disposition: Home  Recommendations for Outpatient Follow-up:  Follow up with PCP in 1-2 weeks Follow-up with [facility] as scheduled  Home Health: No Equipment/Devices: Continue on his home oxygen 4 L per nasal cannula  Discharge Condition: Stable CODE STATUS: Full code Diet recommendation: Heart healthy/consistent carb regular diet  History of present illness:  Elaine Basore is a 47 y.o. male with past medical history significant for COPD/chronic hypoxic respiratory failure on 4 L nasal cannula, obstructive sleep apnea, paroxysmal atrial fibrillation, chronic diastolic congestive heart failure, morbid obesity who presented to [facility] ED on 5/21 with persistent left flank pain radiating to his left abdomen/testicle.     Recently seen at [facility] on 5/19 with similar symptoms with CT abdomen/pelvis revealing obstructing left ureteral calculi with moderate left perinephric fat stranding.  Patient was prescribed oxycodone and Flomax with instructions to follow-up with urology.  But despite pain medication, continued with nausea, vomiting and worsening pain.   In the ED, WBC 18.6, hemoglobin 16.3, platelets 271.  Sodium 132, potassium 3.9, chloride 91, CO2 32, glucose 220, BUN 17, creatinine 1.52.  Lipase 31, AST 11, ALT 12, total bilirubin 0.9.  Urinalysis with large hemoglobin, otherwise unrevealing.  Abdominal x-ray with findings suggestive of obstructing 2-3 mm left UVJ stone.  EDP consulted urology, Dr. Robby Sermon who recommended conservative treatment and to reconsult in the morning if pain uncontrolled.  TRH consulted for admission.  Hospital course:  Obstructive uropathy secondary to UVJ nephrolithiasis Patient presenting to ED with continued pain to left  flank/testicle.  Recently diagnosed with obstructing left 4 mm UVJ stone on 5/19.  Initially discharged home on pain medication and tamsulosin; but symptoms continue to be persistent.  Abdominal x-ray showing obstructing 2-3 mm left UVJ stone.  Patient is afebrile, urinalysis unrevealing.  Patient was supported with Toradol, oxycodone and continued on his tamsulosin with IV fluid hydration.  Serial KUBs were follows with noted eventual passage of left UVJ stone.  Continue Toradol 10 mg p.o. every 8 hours, oxycodone as needed.  Tamsulosin daily.  Outpatient follow-up with urology.  Encouraged to continue to strain urine.   COPD Chronic hypoxic respiratory failure on 4 L nasal cannula at baseline Continue Advair and home oxygen.  Outpatient follow-up with pulmonology.   Acute renal failure Creatinine 1.52 on admission, baseline 0.9- 1.1.  Etiology likely secondary to obstructive uropathy.  Patient was supported with IV fluid hydration with creatinine peaking at 1.55, improved to 1.39 at time of discharge.  Recommend repeat BMP 1 week.   Essential hypertension Paroxysmal atrial fibrillation Chronic diastolic congestive heart failure Diltiazem 120 mg p.o. daily, Amlodipine 5 mg p.o. daily, losartan, furosemide.   Type 2 diabetes mellitus Hemoglobin A1c 8.9 10/04/2022.  On Ozempic outpatient.  Outpatient follow-up with PCP.  OSA Continue nocturnal CPAP with oxygen   GERD -- Protonix 40 mg p.o. daily, Pepcid 20 mg p.o. daily   Tobacco use disorder Counseled on need for complete cessation/abstinence   Morbid obesity Body mass index is 40.9 kg/m.  Discussed with patient needs for aggressive lifestyle changes/weight loss as this complicates all facets of care.  Outpatient follow-up with PCP.   Discharge Diagnoses:  Principal Problem:   Calculus of ureterovesical junction (UVJ) Active Problems:   Hypertension   COPD (chronic obstructive pulmonary disease) (HCC)   Diabetes (HCC)  AKI  (acute kidney injury) (HCC)   Obesity, Class III, BMI 40-49.9 (morbid obesity) (HCC)   Paroxysmal atrial fibrillation (HCC)   Chronic hypoxemic respiratory failure (HCC)   OSA (obstructive sleep apnea)    Discharge Instructions  Discharge Instructions     Call MD for:  difficulty breathing, headache or visual disturbances   Complete by: As directed    Call MD for:  extreme fatigue   Complete by: As directed    Call MD for:  persistant dizziness or light-headedness   Complete by: As directed    Call MD for:  persistant nausea and vomiting   Complete by: As directed    Call MD for:  severe uncontrolled pain   Complete by: As directed    Call MD for:  temperature >100.4   Complete by: As directed    Diet - low sodium heart healthy   Complete by: As directed    Increase activity slowly   Complete by: As directed       Allergies as of 03/02/2023       Reactions   Acetaminophen Other (See Comments)   GI BLEEDING   Shellfish Allergy Nausea And Vomiting, Nausea Only, Other (See Comments)   Headaches and hot flashes, also        Medication List     STOP taking these medications    nicotine 21 mg/24hr patch Commonly known as: NICODERM CQ - dosed in mg/24 hours   ondansetron 4 MG disintegrating tablet Commonly known as: ZOFRAN-ODT       TAKE these medications    Advair HFA 230-21 MCG/ACT inhaler Generic drug: fluticasone-salmeterol Inhale 2 puffs into the lungs 2 (two) times daily.   albuterol 108 (90 Base) MCG/ACT inhaler Commonly known as: VENTOLIN HFA Inhale 2 puffs into the lungs every 6 (six) hours as needed for wheezing or shortness of breath.   amLODipine 5 MG tablet Commonly known as: NORVASC Take 5 mg by mouth daily.   atorvastatin 20 MG tablet Commonly known as: LIPITOR Take 20 mg by mouth daily.   diltiazem 120 MG 24 hr capsule Commonly known as: CARDIZEM CD Take 1 capsule (120 mg total) by mouth daily.   famotidine 20 MG tablet Commonly  known as: PEPCID Take 1 tablet (20 mg total) by mouth 2 (two) times daily. What changed: when to take this   fluticasone 50 MCG/ACT nasal spray Commonly known as: FLONASE Place 1-2 sprays into both nostrils daily as needed for rhinitis or allergies.   furosemide 20 MG tablet Commonly known as: LASIX Take 1 tablet (20 mg total) by mouth 2 (two) times daily. What changed:  how much to take when to take this   ketorolac 10 MG tablet Commonly known as: TORADOL Take 1 tablet (10 mg total) by mouth every 8 (eight) hours for 5 days.   losartan 100 MG tablet Commonly known as: COZAAR Take 100 mg by mouth daily.   omeprazole 20 MG capsule Commonly known as: PRILOSEC Take 20 mg by mouth daily before breakfast.   oxyCODONE 5 MG immediate release tablet Commonly known as: Roxicodone Take 2 tablets (10 mg total) by mouth every 6 (six) hours as needed for up to 5 days for moderate pain. What changed:  how much to take when to take this reasons to take this   Ozempic (1 MG/DOSE) 4 MG/3ML Sopn Generic drug: Semaglutide (1 MG/DOSE) Inject 2 mg into the skin every Sunday.   promethazine 12.5 MG tablet Commonly known as:  PHENERGAN Take 1 tablet (12.5 mg total) by mouth every 6 (six) hours as needed for nausea or vomiting.   tamsulosin 0.4 MG Caps capsule Commonly known as: Flomax Take 1 capsule (0.4 mg total) by mouth daily.        Follow-up Information     Macy Mis, MD. Schedule an appointment as soon as possible for a visit in 1 week(s).   Specialty: Family Medicine Contact information: [address] [place] [address] [phone]         [facility]. Schedule an appointment as soon as possible for a visit.   Contact information: [address] [place] [address] [place] [address] [phone]               Allergies  Allergen Reactions   Acetaminophen Other (See Comments)    GI BLEEDING   Shellfish Allergy  Nausea And Vomiting, Nausea Only and Other (See Comments)    Headaches and hot flashes, also    Consultations: None   Procedures/Studies: DG Abd 1 View  Result Date: 03/02/2023 CLINICAL DATA:  Nephrolithiasis. EXAM: ABDOMEN - 1 VIEW COMPARISON:  Abdominal radiographs 02/28/2023. CT abdomen and pelvis 02/26/2023. FINDINGS: Assessment is limited body habitus. The suspected 2-3 mm left UVJ stone the prior radiographs is no longer clearly identified. Contrast in the left renal collecting system left ureter has cleared. No definite renal calculi are identified. No dilated loops of bowel are seen to suggest obstruction. No acute osseous abnormality is evident. IMPRESSION: Suspected interval passage of the previously shown left ureteral stone. Electronically Signed   By: Sebastian Ache M.D.   On: 03/02/2023 07:50   DG Abdomen 1 View  Result Date: 02/28/2023 CLINICAL DATA:  Kidney stone, left flank pain. Diagnosed with ureteral stone 519 with increased pain tonight EXAM: ABDOMEN - 1 VIEW COMPARISON:  CT abdomen and pelvis 02/26/2023 FINDINGS: Column of contrast within the left ureter and left renal collecting system. There is termination of the contrast in the distal ureter near the UVJ. Possible 2-3 mm stone at the left UVJ versus pelvic phlebolith. Nonobstructive bowel-gas pattern. No acute osseous abnormality. IMPRESSION: Findings suggestive of obstructing 2-3 mm left UVJ stone. Electronically Signed   By: Minerva Fester M.D.   On: 02/28/2023 22:33   CT ABDOMEN PELVIS W CONTRAST  Result Date: 02/26/2023 CLINICAL DATA:  Left lower back and flank pain, hematuria, nausea EXAM: CT ABDOMEN AND PELVIS WITH CONTRAST TECHNIQUE: Multidetector CT imaging of the abdomen and pelvis was performed using the standard protocol following bolus administration of intravenous contrast. RADIATION DOSE REDUCTION: This exam was performed according to the departmental dose-optimization program which includes automated exposure  control, adjustment of the mA and/or kV according to patient size and/or use of iterative reconstruction technique. CONTRAST:  OMNIPAQUE IOHEXOL 300 MG/ML  SOLN COMPARISON:  02/17/2019 FINDINGS: Lower chest: No acute pleural or parenchymal lung disease. Hepatobiliary: No focal liver abnormality is seen. No gallstones, gallbladder wall thickening, or biliary dilatation. Pancreas: Unremarkable. No pancreatic ductal dilatation or surrounding inflammatory changes. Spleen: Normal in size without focal abnormality. Adrenals/Urinary Tract: There is an obstructing 4 mm distal left ureteral calculus reference image 70/2, with moderate left hydronephrosis, left renal edema, and mild left perinephric fat stranding. The right kidney is unremarkable. The adrenals are grossly normal. The bladder is minimally distended, which limits its evaluation. Stomach/Bowel: No bowel obstruction or ileus. No bowel wall thickening or inflammatory change. Vascular/Lymphatic: Aortic atherosclerosis. Borderline enlarged lymph nodes  are seen within the bilateral external iliac chains, nonspecific but stable since prior exam. Reproductive: Prostate is unremarkable. Other: No free fluid or free intraperitoneal gas. Small fat containing midline supraumbilical ventral hernia. No bowel herniation. Musculoskeletal: No acute or destructive bony abnormalities. Reconstructed images demonstrate no additional findings. IMPRESSION: 1. Obstructing 4 mm distal left ureteral calculus, with moderate left-sided obstructive uropathy. 2.  Aortic Atherosclerosis (ICD10-I70.0). Electronically Signed   By: Sharlet Salina M.D.   On: 02/26/2023 22:18     Subjective: Patient seen examined at bedside, sitting in bedside chair.  Reports pain to left flank/groin much improved.  Repeat KUB this morning shows likely passage of left UVJ stone.  Tolerating diet.  States ready for discharge home.  No other specific complaints or concerns at this time.  Denies headache,  no dizziness, no chest pain, no palpitations, no shortness of breath, no fever/chills/night sweats, no nausea/vomiting/diarrhea, no focal weakness, no fatigue, no paresthesias.  No acute events overnight per nursing staff.  Discharge Exam: Vitals:   03/02/23 0610 03/02/23 0820  BP: 123/72   Pulse: 83   Resp: 16   Temp: 98.6 F (37 C)   SpO2: (!) 53% 97%   Vitals:   03/01/23 2120 03/01/23 2131 03/02/23 0610 03/02/23 0820  BP:  118/71 123/72   Pulse:  77 83   Resp:  16 16   Temp:  99.6 F (37.6 C) 98.6 F (37 C)   TempSrc:      SpO2: 92% 91% (!) 53% 97%  Weight:      Height:        Physical Exam: GEN: NAD, alert and oriented x 3, obese HEENT: NCAT, PERRL, EOMI, sclera clear, MMM PULM: CTAB w/o wheezes/crackles, normal respiratory effort, or liters nasal cannula which is his baseline CV: RRR w/o M/G/R GI: abd soft, NTND, NABS, no R/G/M MSK: no peripheral edema, muscle strength globally intact 5/5 bilateral upper/lower extremities NEURO: CN II-XII intact, no focal deficits, sensation to light touch intact PSYCH: normal mood/affect Integumentary: dry/intact, no rashes or wounds    The results of significant diagnostics from this hospitalization (including imaging, microbiology, ancillary and laboratory) are listed below for reference.     Microbiology: No results found for this or any previous visit (from the past 240 hour(s)).   Labs: BNP (last 3 results) Recent Labs    10/03/22 1937  BNP 43.4   Basic Metabolic Panel: Recent Labs  Lab 02/26/23 2020 02/28/23 1920 03/01/23 0320 03/02/23 0546  NA 136 132* 134* 135  K 3.6 3.9 3.7 3.7  CL 95* 91* 94* 94*  CO2 28 32 32 31  GLUCOSE 250* 220* 179* 156*  BUN 12 17 16 14   CREATININE 1.10 1.52* 1.55* 1.39*  CALCIUM 8.8* 8.4* 7.8* 8.0*   Liver Function Tests: Recent Labs  Lab 02/26/23 2020 02/28/23 1920  AST 16 11*  ALT 14 12  ALKPHOS 82 65  BILITOT 0.6 0.9  PROT 7.6 7.6  ALBUMIN 4.1 3.9   Recent Labs   Lab 02/26/23 2020 02/28/23 1920  LIPASE 37 31   No results for input(s): "AMMONIA" in the last 168 hours. CBC: Recent Labs  Lab 02/26/23 2020 02/28/23 1920 03/01/23 0320 03/02/23 0546  WBC 18.2* 18.6* 15.0* 10.4  NEUTROABS 14.1* 14.9*  --   --   HGB 17.1* 16.3 14.9 14.1  HCT 50.2 48.5 45.3 44.3  MCV 88.2 89.5 91.9 93.1  PLT 299 271 224 233   Cardiac Enzymes: No results for input(s): "CKTOTAL", "  CKMB", "CKMBINDEX", "TROPONINI" in the last 168 hours. BNP: Invalid input(s): "POCBNP" CBG: Recent Labs  Lab 03/01/23 1234 03/01/23 1708 03/01/23 1933 03/01/23 2124 03/02/23 0745  GLUCAP 177* 235* 113* 161* 151*   D-Dimer No results for input(s): "DDIMER" in the last 72 hours. Hgb A1c No results for input(s): "HGBA1C" in the last 72 hours. Lipid Profile No results for input(s): "CHOL", "HDL", "LDLCALC", "TRIG", "CHOLHDL", "LDLDIRECT" in the last 72 hours. Thyroid function studies No results for input(s): "TSH", "T4TOTAL", "T3FREE", "THYROIDAB" in the last 72 hours.  Invalid input(s): "FREET3" Anemia work up No results for input(s): "VITAMINB12", "FOLATE", "FERRITIN", "TIBC", "IRON", "RETICCTPCT" in the last 72 hours. Urinalysis    Component Value Date/Time   COLORURINE YELLOW 02/28/2023 2105   APPEARANCEUR CLEAR 02/28/2023 2105   LABSPEC 1.010 02/28/2023 2105   PHURINE 6.0 02/28/2023 2105   GLUCOSEU NEGATIVE 02/28/2023 2105   HGBUR LARGE (A) 02/28/2023 2105   BILIRUBINUR NEGATIVE 02/28/2023 2105   KETONESUR NEGATIVE 02/28/2023 2105   PROTEINUR NEGATIVE 02/28/2023 2105   UROBILINOGEN 0.2 08/11/2013 1315   NITRITE NEGATIVE 02/28/2023 2105   LEUKOCYTESUR NEGATIVE 02/28/2023 2105   Sepsis Labs Recent Labs  Lab 02/26/23 2020 02/28/23 1920 03/01/23 0320 03/02/23 0546  WBC 18.2* 18.6* 15.0* 10.4   Microbiology No results found for this or any previous visit (from the past 240 hour(s)).   Time coordinating discharge: Over 30 minutes  SIGNED:   Alvira Philips  [place], DO  [facility] 03/02/2023, 9:53 AM

## 2023-03-02 NOTE — Plan of Care (Signed)

## 2023-05-02 ENCOUNTER — Ambulatory Visit: Payer: Medicaid Other | Admitting: Primary Care

## 2023-05-03 ENCOUNTER — Encounter: Payer: Self-pay | Admitting: Primary Care

## 2023-05-03 ENCOUNTER — Ambulatory Visit: Payer: Medicaid Other | Admitting: Primary Care

## 2023-05-03 VITALS — BP 116/68 | HR 80 | Temp 98.2°F | Ht 68.0 in | Wt 257.6 lb

## 2023-05-03 DIAGNOSIS — F17200 Nicotine dependence, unspecified, uncomplicated: Secondary | ICD-10-CM

## 2023-05-03 DIAGNOSIS — G4733 Obstructive sleep apnea (adult) (pediatric): Secondary | ICD-10-CM | POA: Diagnosis not present

## 2023-05-03 MED ORDER — IPRATROPIUM-ALBUTEROL 20-100 MCG/ACT IN AERS: 1.0000 | INHALATION_SPRAY | Freq: Four times a day (QID) | RESPIRATORY_TRACT | 2 refills | Status: DC | PRN

## 2023-05-03 NOTE — Assessment & Plan Note (Signed)
-   Patient is not ready to quit smoking - Given counseling on smoking cessation

## 2023-05-03 NOTE — Patient Instructions (Addendum)
Recommendations: Try saline nasal spray Try biotene mouth wash before bed for dry mouth Keep water by bedside  Continue oxygen with CPAP at night Continue Advair twice daily  Change Albuterol rescue inhaler to Combivent (ipratropium-albuterol) - take 1 puff every 6 hours only as needed   Orders: Adjust BIPAP pressure 21/17  Follow-up: 3 months with Dr. Wynona Neat or [facility] NP    You can receive free nicotine replacement therapy ( patches, gum or mints) by calling 1-800-QUIT NOW. Please call so we can get you on the path to becoming  a non-smoker. I know it is hard, but you can do this!  Other options for assistance in smoking cessation ( As covered by your insurance benefits)  Hypnosis for smoking cessation  [employer] [phone]  Acupuncture for smoking cessation  [employer] [phone]

## 2023-05-03 NOTE — Assessment & Plan Note (Signed)
-   Stable; No acute respiratory symptoms.  - Feels albuterol rescue inhaler is not help any longer  - Continue Advair one puff twice daily  - Rx Combivent hfa 1 puffs every 6 hours

## 2023-05-03 NOTE — Progress Notes (Signed)
@Patient  ID: Steven Dudley, male    DOB: June 21, 1976, 47 y.o.   MRN: [id number]  Chief Complaint  Patient presents with   Follow-up    Referring provider: Macy Mis, MD  HPI: 47 year old male, heavy smoker.  Past medical history significant for OSA.  Patient of Dr. Wynona Neat, last seen on 10/21/2022.  05/03/2023 Patient presents today for overdue follow-up for Sleep apnea, chronic hypoxemic respiratory failure and COPD. Split night sleep showed severe sleep study. He is able to sleep a little longer since starting PAP therapy. He does not wake up up as frequently. He gets 3 hours of sleep at a time. He is dreaming more. Waking up with dry mouth. Feels more rested, not as exhausted.   Moderate obstruction on pulmonary function testing On Advair twice a day  Rescue inhaler does helps as much anymore  Continued on nocturnal oxygen Not ready to quit smoking   Airview download 04/02/23-05/01/23 Usage 30/30 days; 97% > 4 hours IPAP 22 cm H2O/EPAP 18 cm H2O Air leaks 78 L/min (95%) AHI 0.9   Allergies  Allergen Reactions   Acetaminophen Other (See Comments)    GI BLEEDING   Shellfish Allergy Nausea And Vomiting, Nausea Only and Other (See Comments)    Headaches and hot flashes, also    Immunization History  Administered Date(s) Administered   Influenza Inj Mdck Quad Pf 07/13/2022   Influenza, High Dose Seasonal PF 09/16/2015   Influenza,inj,Quad PF,6+ Mos 09/16/2015   Influenza-Unspecified 06/27/2014   Janssen (J&J) SARS-COV-2 Vaccination 02/25/2020   PNEUMOCOCCAL CONJUGATE-20 10/05/2022   Pneumococcal Conjugate-13 06/29/2021   Pneumococcal Polysaccharide-23 09/16/2015, 04/23/2018   Pneumococcal-Unspecified 04/23/2018   Tdap 10/08/2015    Past Medical History:  Diagnosis Date   Acid reflux    Acute respiratory failure (HCC)    Alcohol abuse    Anxiety    Asthma    daily and prn inhalers   CHF (congestive heart failure) (HCC)    Chondromalacia of right patella  09/2014   COPD (chronic obstructive pulmonary disease) (HCC)    Diabetes mellitus without complication (HCC)    TYPE II   Gastritis    History of traumatic brain injury 2006   Hyperglycemia    Hypertension    states under control with med., has been on med. x 1 yr.   Meniscal cyst 09/2014   right knee   Morbid obesity (HCC)    Obesity    Renal disorder    Sleep apnea    Smokers' cough (HCC)     Tobacco History: Social History   Tobacco Use  Smoking Status Heavy Smoker   Current packs/day: 1.50   Average packs/day: 1.5 packs/day for 35.0 years (52.5 ttl pk-yrs)   Types: Cigarettes  Smokeless Tobacco Former   Ready to quit: Not Answered Counseling given: Not Answered   Outpatient Medications Prior to Visit  Medication Sig Dispense Refill   amLODipine (NORVASC) 5 MG tablet Take 5 mg by mouth daily.     atorvastatin (LIPITOR) 20 MG tablet Take 20 mg by mouth daily.     diltiazem (CARDIZEM CD) 120 MG 24 hr capsule Take 1 capsule (120 mg total) by mouth daily. 90 capsule 3   famotidine (PEPCID) 20 MG tablet Take 1 tablet (20 mg total) by mouth 2 (two) times daily. (Patient taking differently: Take 20 mg by mouth daily before breakfast.) 30 tablet 2   fluticasone (FLONASE) 50 MCG/ACT nasal spray Place 1-2 sprays into both nostrils daily as needed for  rhinitis or allergies.     fluticasone-salmeterol (ADVAIR HFA) 230-21 MCG/ACT inhaler Inhale 2 puffs into the lungs 2 (two) times daily.     furosemide (LASIX) 20 MG tablet Take 1 tablet (20 mg total) by mouth 2 (two) times daily. (Patient taking differently: Take 40 mg by mouth in the morning.) 60 tablet 1   losartan (COZAAR) 100 MG tablet Take 100 mg by mouth daily.     omeprazole (PRILOSEC) 20 MG capsule Take 20 mg by mouth daily before breakfast.     OZEMPIC, 1 MG/DOSE, 4 MG/3ML SOPN Inject 2 mg into the skin every Sunday.     promethazine (PHENERGAN) 12.5 MG tablet Take 1 tablet (12.5 mg total) by mouth every 6 (six) hours as  needed for nausea or vomiting. 30 tablet 0   albuterol (VENTOLIN HFA) 108 (90 Base) MCG/ACT inhaler Inhale 2 puffs into the lungs every 6 (six) hours as needed for wheezing or shortness of breath. 1 Inhaler 1   No facility-administered medications prior to visit.      Review of Systems  Review of Systems  Constitutional:  Negative for fatigue.  HENT: Negative.    Respiratory: Negative.    Cardiovascular: Negative.      Physical Exam  BP 116/68 (BP Location: Right Arm, Patient Position: Sitting, Cuff Size: Large)   Pulse 80   Temp 98.2 F (36.8 C) (Oral)   Ht 5\' 8"  (1.727 m)   Wt 257 lb 9.6 oz (116.8 kg)   SpO2 94%   BMI 39.17 kg/m  Physical Exam Constitutional:      General: He is not in acute distress.    Appearance: Normal appearance. He is obese. He is not ill-appearing.  HENT:     Head: Normocephalic and atraumatic.  Cardiovascular:     Rate and Rhythm: Normal rate and regular rhythm.  Pulmonary:     Effort: Pulmonary effort is normal.     Breath sounds: Normal breath sounds.  Skin:    General: Skin is warm and dry.  Neurological:     General: No focal deficit present.     Mental Status: He is alert and oriented to person, place, and time. Mental status is at baseline.  Psychiatric:        Mood and Affect: Mood normal.        Behavior: Behavior normal.        Thought Content: Thought content normal.        Judgment: Judgment normal.      Lab Results:  CBC    Component Value Date/Time   WBC 10.4 03/02/2023 0546   RBC 4.76 03/02/2023 0546   HGB 14.1 03/02/2023 0546   HCT 44.3 03/02/2023 0546   PLT 233 03/02/2023 0546   MCV 93.1 03/02/2023 0546   MCH 29.6 03/02/2023 0546   MCHC 31.8 03/02/2023 0546   RDW 12.4 03/02/2023 0546   LYMPHSABS 1.6 02/28/2023 1920   MONOABS 1.3 (H) 02/28/2023 1920   EOSABS 0.0 02/28/2023 1920   BASOSABS 0.1 02/28/2023 1920    BMET    Component Value Date/Time   NA 135 03/02/2023 0546   K 3.7 03/02/2023 0546   CL  94 (L) 03/02/2023 0546   CO2 31 03/02/2023 0546   GLUCOSE 156 (H) 03/02/2023 0546   BUN 14 03/02/2023 0546   CREATININE 1.39 (H) 03/02/2023 0546   CALCIUM 8.0 (L) 03/02/2023 0546   GFRNONAA >60 03/02/2023 0546   GFRAA >60 11/30/2019 0236    BNP  Component Value Date/Time   BNP 43.4 10/03/2022 1937    ProBNP No results found for: "PROBNP"  Imaging: No results found.   Assessment & Plan:   OSA (obstructive sleep apnea) - Split night sleep study in April 2024 showed severe obstructive sleep apnea. Optimal BiPAP setting 22/18 - Patient is 97% compliant with BiPAP use greater than 4 hours over the last 30 days. He has noticed improvement in sleep quality.  He is experiencing some bloating and dry mouth symptoms. We will adjust BiPAP settings 21/17cm h20.  Recommend he try using Biotene mouthwash and saline nasal spray before bed.    COPD (chronic obstructive pulmonary disease) (HCC) - Stable; No acute respiratory symptoms.  - Feels albuterol rescue inhaler is not help any longer  - Continue Advair one puff twice daily  - Rx Combivent hfa 1 puffs every 6 hours   Tobacco use disorder - Patient is not ready to quit smoking - Given counseling on smoking cessation    Glenford Bayley, NP 05/08/2023

## 2023-05-03 NOTE — Assessment & Plan Note (Signed)
-   Split night sleep study in April 2024 showed severe obstructive sleep apnea. Optimal BiPAP setting 22/18 - Patient is 97% compliant with BiPAP use greater than 4 hours over the last 30 days. He has noticed improvement in sleep quality.  He is experiencing some bloating and dry mouth symptoms. We will adjust BiPAP settings 21/17cm h20.  Recommend he try using Biotene mouthwash and saline nasal spray before bed.

## 2023-07-26 ENCOUNTER — Telehealth: Payer: Self-pay | Admitting: Pulmonary Disease

## 2023-07-26 NOTE — Telephone Encounter (Signed)
Answering Service:  Patient wants to quit smoking and needs patches prescribed to him.

## 2023-07-28 NOTE — Telephone Encounter (Signed)
Dr. Val Eagle, please advise if you are okay with sending Rx for nicotine patches to the pharmacy for pt.

## 2023-07-31 ENCOUNTER — Telehealth: Payer: Self-pay | Admitting: Nurse Practitioner

## 2023-07-31 ENCOUNTER — Other Ambulatory Visit: Payer: Self-pay | Admitting: Pulmonary Disease

## 2023-07-31 ENCOUNTER — Encounter: Payer: Self-pay | Admitting: Pulmonary Disease

## 2023-07-31 DIAGNOSIS — J431 Panlobular emphysema: Secondary | ICD-10-CM

## 2023-07-31 DIAGNOSIS — F17200 Nicotine dependence, unspecified, uncomplicated: Secondary | ICD-10-CM

## 2023-07-31 MED ORDER — NICOTINE 21 MG/24HR TD PT24
MEDICATED_PATCH | TRANSDERMAL | 0 refills | Status: DC
Start: 2023-07-31 — End: 2023-07-31

## 2023-07-31 MED ORDER — NICOTINE 14 MG/24HR TD PT24: 14.0000 mg | MEDICATED_PATCH | Freq: Every day | TRANSDERMAL | 1 refills | Status: DC

## 2023-07-31 NOTE — Telephone Encounter (Signed)
PT states he would like to have Nicotine Patches called in for him. Both Dr. Val Eagle and Ms. Clent Ridges said they'd call them in on request.    [facility] on [address]: Nicoltine Transdermal System is the only one that is covered.He only has one box of the Step 1 patches currently.

## 2023-08-04 ENCOUNTER — Ambulatory Visit: Payer: Medicaid Other | Admitting: Primary Care

## 2023-08-07 ENCOUNTER — Ambulatory Visit: Payer: Medicaid Other | Admitting: Primary Care

## 2023-09-01 NOTE — Telephone Encounter (Signed)
Old request for meds. Please advise.

## 2023-09-02 ENCOUNTER — Encounter: Payer: Self-pay | Admitting: Pulmonary Disease

## 2023-09-02 ENCOUNTER — Other Ambulatory Visit: Payer: Self-pay | Admitting: Pulmonary Disease

## 2023-09-02 MED ORDER — NICOTINE 14 MG/24HR TD PT24: 14.0000 mg | MEDICATED_PATCH | Freq: Every day | TRANSDERMAL | 1 refills | Status: AC

## 2023-09-18 ENCOUNTER — Encounter: Payer: Self-pay | Admitting: Primary Care

## 2023-09-20 NOTE — Telephone Encounter (Signed)
NFN 

## 2023-10-09 ENCOUNTER — Ambulatory Visit: Payer: Medicaid Other | Admitting: Primary Care

## 2023-11-09 ENCOUNTER — Encounter (HOSPITAL_BASED_OUTPATIENT_CLINIC_OR_DEPARTMENT_OTHER): Payer: Self-pay | Admitting: Emergency Medicine

## 2023-11-09 ENCOUNTER — Emergency Department (HOSPITAL_BASED_OUTPATIENT_CLINIC_OR_DEPARTMENT_OTHER)
Admission: EM | Admit: 2023-11-09 | Discharge: 2023-11-09 | Disposition: A | Discharge status: Home / Self Care | Payer: Medicaid Other | Attending: Emergency Medicine | Admitting: Emergency Medicine

## 2023-11-09 ENCOUNTER — Other Ambulatory Visit: Payer: Self-pay

## 2023-11-09 ENCOUNTER — Emergency Department (HOSPITAL_BASED_OUTPATIENT_CLINIC_OR_DEPARTMENT_OTHER): Payer: Medicaid Other

## 2023-11-09 DIAGNOSIS — X58XXXA Exposure to other specified factors, initial encounter: Secondary | ICD-10-CM | POA: Diagnosis not present

## 2023-11-09 DIAGNOSIS — M25562 Pain in left knee: Secondary | ICD-10-CM | POA: Insufficient documentation

## 2023-11-09 NOTE — Discharge Instructions (Signed)
You can continue taking ibuprofen morning and night and you can add on Tylenol during the day.  I have given you referral for orthopedics.  Use knee brace for comfort.  You may return to the emergency department today for any worsening symptoms.

## 2023-11-09 NOTE — ED Triage Notes (Signed)
Injured left knee 1 month ago , persistent pain since then .

## 2023-11-09 NOTE — ED Provider Notes (Signed)
[facility] Provider Note   CSN: [id number] Arrival date & time: 11/09/23  0802     History Chief Complaint  Patient presents with   Knee Injury    Steven Dudley is a 48 y.o. male patient who presents to the emergency department today for further evaluation of left knee pain.  This has been persistent of the last month.  He states that over [place] he got intoxicated and believes that he fell out of bed or torqued his knee.  Does not recall the actual event.  Woke up the next day with pain and has had pain ever since.  He describes a catching sensation when he walks but the knee joint does not feel unstable.  He denies any fever, chills.  He has been taking ibuprofen which seems to help with the pain.  HPI     Home Medications Prior to Admission medications   Medication Sig Start Date End Date Taking? Authorizing Provider  amLODipine (NORVASC) 5 MG tablet Take 5 mg by mouth daily. 11/21/22   [provider]  atorvastatin (LIPITOR) 20 MG tablet Take 20 mg by mouth daily.    [provider]  diltiazem (CARDIZEM CD) 120 MG 24 hr capsule Take 1 capsule (120 mg total) by mouth daily. 12/09/22   Kathleene Hazel, MD  famotidine (PEPCID) 20 MG tablet Take 1 tablet (20 mg total) by mouth 2 (two) times daily. Patient taking differently: Take 20 mg by mouth daily before breakfast. 02/17/19   Arby Barrette, MD  fluticasone (FLONASE) 50 MCG/ACT nasal spray Place 1-2 sprays into both nostrils daily as needed for rhinitis or allergies.    [provider]  fluticasone-salmeterol (ADVAIR HFA) 230-21 MCG/ACT inhaler Inhale 2 puffs into the lungs 2 (two) times daily.    [provider]  furosemide (LASIX) 20 MG tablet Take 1 tablet (20 mg total) by mouth 2 (two) times daily. Patient taking differently: Take 40 mg by mouth in the morning. 04/25/18   Richarda Overlie, MD  Ipratropium-Albuterol (COMBIVENT) 20-100  MCG/ACT AERS respimat Inhale 1 puff into the lungs every 6 (six) hours as needed for wheezing. 05/03/23   Glenford Bayley, NP  losartan (COZAAR) 100 MG tablet Take 100 mg by mouth daily. 10/08/19   [provider]  nicotine (NICODERM CQ) 14 mg/24hr patch Place 1 patch (14 mg total) onto the skin daily. 09/02/23   Tomma Lightning, MD  omeprazole (PRILOSEC) 20 MG capsule Take 20 mg by mouth daily before breakfast. 10/02/19   [provider]  OZEMPIC, 1 MG/DOSE, 4 MG/3ML SOPN Inject 2 mg into the skin every Sunday.    [provider]  promethazine (PHENERGAN) 12.5 MG tablet Take 1 tablet (12.5 mg total) by mouth every 6 (six) hours as needed for nausea or vomiting. 03/02/23   [place], Alvira Philips, DO      Allergies    Acetaminophen and Shellfish allergy    Review of Systems   Review of Systems  All other systems reviewed and are negative.   Physical Exam Updated Vital Signs BP 119/79 (BP Location: Right Arm)   Pulse 75   Temp 98.4 F (36.9 C)   Resp 17   Wt 111.1 kg   SpO2 96%   BMI 37.25 kg/m  Physical Exam Vitals and nursing note reviewed.  Constitutional:      Appearance: Normal appearance.  HENT:     Head: Normocephalic and atraumatic.  Eyes:  General:        Right eye: No discharge.        Left eye: No discharge.     Conjunctiva/sclera: Conjunctivae normal.  Pulmonary:     Effort: Pulmonary effort is normal.  Musculoskeletal:     Comments: Negative of valgus and varus stress testing.  Negative anterior drawer testing.  Patient has good sensation and good range of motion in the left knee.  Posterior tibialis pulse felt on the left ankle.  Sensation is intact.  Skin:    General: Skin is warm and dry.     Findings: No rash.  Neurological:     General: No focal deficit present.     Mental Status: He is alert.  Psychiatric:        Mood and Affect: Mood normal.        Behavior: Behavior normal.     ED Results / Procedures / Treatments    Labs (all labs ordered are listed, but only abnormal results are displayed) Labs Reviewed - No data to display  EKG None  Radiology DG Knee Complete 4 Views Left Result Date: 11/09/2023 CLINICAL DATA:  Left knee pain.  No known injury. EXAM: LEFT KNEE - COMPLETE 4+ VIEW COMPARISON:  12/16/2016. FINDINGS: No acute fracture or dislocation. No aggressive osseous lesion. There are degenerative changes of the knee joint in the form of reduced tibio-femoral compartment joint space and osteophytosis. No knee effusion or focal soft tissue swelling. No radiopaque foreign bodies. IMPRESSION: *No acute osseous abnormality of the left knee joint. Mild degenerative osteoarthritis. Electronically Signed   By: Jules Schick M.D.   On: 11/09/2023 08:59    Procedures Procedures    Medications Ordered in ED Medications - No data to display  ED Course/ Medical Decision Making/ A&P   {   Click here for ABCD2, HEART and other calculators  Medical Decision Making Steven Dudley is a 49 y.o. male patient who presents to the emergency department today for further evaluation of left-sided knee pain.  I am suspicious for meniscal injury.  I below suspicion for serious ligamentous injury involving the ACL, PCL, MCL, or LCL.  I have a low suspicion for septic joint at this time.  Will treat conservatively with a knee sleeve for comfort as he does state the pain is actually getting better.  Will give him a referral for orthopedics for further evaluation of possible meniscal injury.  Patient agreeable to plan.  Strict return precaution were discussed.  He is safe for discharge.   Amount and/or Complexity of Data Reviewed Radiology: ordered.    Final Clinical Impression(s) / ED Diagnoses Final diagnoses:  Acute pain of left knee    Rx / DC Orders ED Discharge Orders     None         Teressa Lower, [place] 11/09/23 1610    Tegeler, Canary Brim, MD 11/09/23 1022

## 2023-11-09 NOTE — ED Notes (Signed)
Discharge paperwork reviewed entirely with patient, including follow up care. Pain was under control. No prescriptions were called in, but all questions were addressed.  Pt verbalized understanding as well as all parties involved. No questions or concerns voiced at the time of discharge. No acute distress noted.   Pt ambulated out to PVA without incident or assistance.  Pt advised they will seek followup care with a specialist and followup with their PCP.

## 2023-11-28 ENCOUNTER — Ambulatory Visit (INDEPENDENT_AMBULATORY_CARE_PROVIDER_SITE_OTHER): Payer: Medicaid Other | Admitting: Primary Care

## 2023-11-28 ENCOUNTER — Encounter: Payer: Self-pay | Admitting: Primary Care

## 2023-11-28 ENCOUNTER — Ambulatory Visit: Payer: Medicaid Other

## 2023-11-28 VITALS — BP 118/80 | HR 75 | Temp 97.5°F | Ht 67.0 in | Wt 244.8 lb

## 2023-11-28 DIAGNOSIS — J431 Panlobular emphysema: Secondary | ICD-10-CM | POA: Diagnosis not present

## 2023-11-28 DIAGNOSIS — J449 Chronic obstructive pulmonary disease, unspecified: Secondary | ICD-10-CM

## 2023-11-28 DIAGNOSIS — F17200 Nicotine dependence, unspecified, uncomplicated: Secondary | ICD-10-CM

## 2023-11-28 MED ORDER — TRELEGY ELLIPTA 100-62.5-25 MCG/ACT IN AEPB: 1.0000 | INHALATION_SPRAY | Freq: Every day | RESPIRATORY_TRACT | Status: DC

## 2023-11-28 NOTE — Patient Instructions (Addendum)
  -  CHRONIC OBSTRUCTIVE PULMONARY DISEASE (COPD): COPD is a chronic lung disease that causes breathing difficulties. We will try a new inhaler, Trelegy, once daily for two weeks to see if it helps. Continue using Advair and Combivent as needed, but if Trelegy works well, we might switch Combivent to plain albuterol. We also ordered an annual chest x-ray due to your smoking history and chronic bronchitis.  -OBSTRUCTIVE SLEEP APNEA (OSA): OSA is a condition where your breathing stops and starts during sleep. Continue using your BiPAP machine as it helps you sleep better. We will monitor your symptoms, especially the dry mouth.  -TOBACCO USE: You continue to smoke despite previous attempts to quit. We encourage you to consider quitting when you feel ready. No changes to your current management for now.  -GENERAL HEALTH MAINTENANCE: We discussed your eligibility for a lung cancer screening program at age 48. Avoid mixing alcohol with your medications, and be aware that alcohol can worsen sleep apnea.  Follow-up 2 week virtual visit with Beth medication check in (OK to double book - please write in apt note that this was approved)

## 2023-11-28 NOTE — Progress Notes (Unsigned)
 @Patient  ID: Steven Dudley, male    DOB: 12-08-1975, 48 y.o.   MRN: [id number]  Chief Complaint  Patient presents with   Follow-up    Referring provider: Macy Mis, MD  HPI: 48 year old male, heavy smoker.  Past medical history significant for OSA.  Patient of Dr. Wynona Neat.  Previous [facility] pulmonary encounter: 05/03/2023 Patient presents today for overdue follow-up for Sleep apnea, chronic hypoxemic respiratory failure and COPD. Split night sleep showed severe sleep study. He is able to sleep a little longer since starting PAP therapy. He does not wake up up as frequently. He gets 3 hours of sleep at a time. He is dreaming more. Waking up with dry mouth. Feels more rested, not as exhausted.   Moderate obstruction on pulmonary function testing On Advair twice a day  Rescue inhaler does helps as much anymore  Continued on nocturnal oxygen Not ready to quit smoking   Airview download 04/02/23-05/01/23 Usage 30/30 days; 97% > 4 hours IPAP 22 cm H2O/EPAP 18 cm H2O Air leaks 78 L/min (95%) AHI 0.9   OSA (obstructive sleep apnea) - Split night sleep study in April 2024 showed severe obstructive sleep apnea. Optimal BiPAP setting 22/18 - Patient is 97% compliant with BiPAP use greater than 4 hours over the last 30 days. He has noticed improvement in sleep quality.  He is experiencing some bloating and dry mouth symptoms. We will adjust BiPAP settings 21/17cm h20.  Recommend he try using Biotene mouthwash and saline nasal spray before bed.    COPD (chronic obstructive pulmonary disease) (HCC) - Stable; No acute respiratory symptoms.  - Feels albuterol rescue inhaler is not help any longer  - Continue Advair one puff twice daily     Tobacco use disorder - Patient is not ready to quit smoking - Given counseling on smoking cessation   11/28/2023 Discussed the use of AI scribe software for clinical note transcription with the patient, who gave verbal consent to proceed.  History  of Present Illness   Steven Dudley is a 48 year old male with COPD and sleep apnea who presents for a follow-up visit.  He has moderate COPD, confirmed by a pulmonary function test. He uses Advair twice daily, and Combivent as a rescue inhaler as needed. The rescue inhaler is less effective than before. He experiences mild congestion, especially when exposed to dust at work, and occasional wheezing and chest tightness. He uses oxygen at night with his BiPAP machine.  Regarding sleep apnea, a split night sleep study showed severe sleep apnea. Since starting BiPAP therapy, he sleeps longer, wakes up less frequently, and feels more rested, though he still experiences dry mouth. He uses the BiPAP almost every night, except occasionally when he drinks alcohol.  He has a significant smoking history and has attempted smoking cessation with patches and Chantix without success. He reduced smoking from two packs a day to about ten cigarettes a day. He tried vaping but found it counterproductive. 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  He has a history of alcohol use, drinking socially with his wife about three times a month, reduced from previous daily use.      Airview download 10/28/23-11/26/23 Days used 28/30 days (93%) Average usage 4 hours 45 mins  Pressure 22/18cm h20 Airleaks 98.5L/min AHI 1.0   Allergies  Allergen Reactions   Acetaminophen Other (See Comments)    GI BLEEDING   Shellfish Allergy Nausea And Vomiting, Nausea  Only and Other (See Comments)    Headaches and hot flashes, also    Immunization History  Administered Date(s) Administered   Influenza Inj Mdck Quad Pf 07/13/2022   Influenza, High Dose Seasonal PF 09/16/2015   Influenza,inj,Quad PF,6+ Mos 09/16/2015   Influenza-Unspecified 06/27/2014   Janssen (J&J) SARS-COV-2 Vaccination 02/25/2020   PNEUMOCOCCAL CONJUGATE-20 10/05/2022   Pneumococcal Conjugate-13  06/29/2021   Pneumococcal Polysaccharide-23 09/16/2015, 04/23/2018   Pneumococcal-Unspecified 04/23/2018   Tdap 10/08/2015    Past Medical History:  Diagnosis Date   Acid reflux    Acute respiratory failure (HCC)    Alcohol abuse    Anxiety    Asthma    daily and prn inhalers   CHF (congestive heart failure) (HCC)    Chondromalacia of right patella 09/2014   COPD (chronic obstructive pulmonary disease) (HCC)    Diabetes mellitus without complication (HCC)    TYPE II   Gastritis    History of traumatic brain injury 2006   Hyperglycemia    Hypertension    states under control with med., has been on med. x 1 yr.   Meniscal cyst 09/2014   right knee   Morbid obesity (HCC)    Obesity    Renal disorder    Sleep apnea    Smokers' cough (HCC)     Tobacco History: Social History   Tobacco Use  Smoking Status Heavy Smoker   Current packs/day: 1.50   Average packs/day: 1.5 packs/day for 35.0 years (52.5 ttl pk-yrs)   Types: Cigarettes  Smokeless Tobacco Former  Tobacco Comments   1.5 ppd- ab, cma 11-28-23   Ready to quit: Not Answered Counseling given: Not Answered Tobacco comments: 1.5 ppd- ab, cma 11-28-23   Outpatient Medications Prior to Visit  Medication Sig Dispense Refill   amLODipine (NORVASC) 5 MG tablet Take 5 mg by mouth daily.     atorvastatin (LIPITOR) 20 MG tablet Take 20 mg by mouth daily.     diltiazem (CARDIZEM CD) 120 MG 24 hr capsule Take 1 capsule (120 mg total) by mouth daily. 90 capsule 3   famotidine (PEPCID) 20 MG tablet Take 1 tablet (20 mg total) by mouth 2 (two) times daily. (Patient taking differently: Take 20 mg by mouth daily before breakfast.) 30 tablet 2   fluticasone (FLONASE) 50 MCG/ACT nasal spray Place 1-2 sprays into both nostrils daily as needed for rhinitis or allergies.     fluticasone-salmeterol (ADVAIR HFA) 230-21 MCG/ACT inhaler Inhale 2 puffs into the lungs 2 (two) times daily.     furosemide (LASIX) 20 MG tablet Take 1 tablet  (20 mg total) by mouth 2 (two) times daily. (Patient taking differently: Take 40 mg by mouth in the morning.) 60 tablet 1   Ipratropium-Albuterol (COMBIVENT) 20-100 MCG/ACT AERS respimat Inhale 1 puff into the lungs every 6 (six) hours as needed for wheezing. 1 each 2   losartan (COZAAR) 100 MG tablet Take 100 mg by mouth daily.     nicotine (NICODERM CQ) 14 mg/24hr patch Place 1 patch (14 mg total) onto the skin daily. 28 patch 1   omeprazole (PRILOSEC) 20 MG capsule Take 20 mg by mouth daily before breakfast.     OZEMPIC, 1 MG/DOSE, 4 MG/3ML SOPN Inject 2 mg into the skin every Sunday.     promethazine (PHENERGAN) 12.5 MG tablet Take 1 tablet (12.5 mg total) by mouth every 6 (six) hours as needed for nausea or vomiting. 30 tablet 0   No facility-administered medications prior to visit.  Review of Systems  Review of Systems  Constitutional: Negative.   Respiratory:  Positive for chest tightness and wheezing.   Cardiovascular: Negative.     Physical Exam  BP 118/80 (BP Location: Left Arm, Patient Position: Sitting)   Pulse 75   Temp (!) 97.5 F (36.4 C) (Temporal)   Ht 5\' 7"  (1.702 m)   Wt 244 lb 12.8 oz (111 kg)   SpO2 95%   BMI 38.34 kg/m  Physical Exam Constitutional:      General: He is not in acute distress.    Appearance: Normal appearance. He is not ill-appearing.  HENT:     Head: Normocephalic and atraumatic.  Cardiovascular:     Rate and Rhythm: Normal rate and regular rhythm.  Pulmonary:     Effort: Pulmonary effort is normal.     Breath sounds: Normal breath sounds. No wheezing or rhonchi.  Skin:    General: Skin is dry.  Neurological:     General: No focal deficit present.     Mental Status: He is alert and oriented to person, place, and time. Mental status is at baseline.  Psychiatric:        Mood and Affect: Mood normal.        Behavior: Behavior normal.        Thought Content: Thought content normal.        Judgment: Judgment normal.      Lab  Results:  CBC    Component Value Date/Time   WBC 10.4 03/02/2023 0546   RBC 4.76 03/02/2023 0546   HGB 14.1 03/02/2023 0546   HCT 44.3 03/02/2023 0546   PLT 233 03/02/2023 0546   MCV 93.1 03/02/2023 0546   MCH 29.6 03/02/2023 0546   MCHC 31.8 03/02/2023 0546   RDW 12.4 03/02/2023 0546   LYMPHSABS 1.6 02/28/2023 1920   MONOABS 1.3 (H) 02/28/2023 1920   EOSABS 0.0 02/28/2023 1920   BASOSABS 0.1 02/28/2023 1920    BMET    Component Value Date/Time   NA 135 03/02/2023 0546   K 3.7 03/02/2023 0546   CL 94 (L) 03/02/2023 0546   CO2 31 03/02/2023 0546   GLUCOSE 156 (H) 03/02/2023 0546   BUN 14 03/02/2023 0546   CREATININE 1.39 (H) 03/02/2023 0546   CALCIUM 8.0 (L) 03/02/2023 0546   GFRNONAA >60 03/02/2023 0546   GFRAA >60 11/30/2019 0236    BNP    Component Value Date/Time   BNP 43.4 10/03/2022 1937    ProBNP No results found for: "PROBNP"  Imaging: DG Knee Complete 4 Views Left Result Date: 11/09/2023 CLINICAL DATA:  Left knee pain.  No known injury. EXAM: LEFT KNEE - COMPLETE 4+ VIEW COMPARISON:  12/16/2016. FINDINGS: No acute fracture or dislocation. No aggressive osseous lesion. There are degenerative changes of the knee joint in the form of reduced tibio-femoral compartment joint space and osteophytosis. No knee effusion or focal soft tissue swelling. No radiopaque foreign bodies. IMPRESSION: *No acute osseous abnormality of the left knee joint. Mild degenerative osteoarthritis. Electronically Signed   By: Jules Schick M.D.   On: 11/09/2023 08:59     Assessment & Plan:   1. Tobacco use disorder (Primary) - DG Chest 2 View; Future  2. Panlobular emphysema (HCC) - DG Chest 2 View; Future  3. Chronic obstructive pulmonary disease, unspecified COPD type (HCC) - DG Chest 2 View; Future  Chronic Obstructive Pulmonary Disease (COPD) Stable symptoms with occasional wheezing and chest tightness. Current treatment with Advair and Combivent, but  patient reports  Combivent induces coughing fits. Exposure to dust and particles at work exacerbates symptoms.  -Trial of Trelegy (triple therapy inhaler) once daily for two weeks, sample provided. -Continue Combivent as needed, but consider replacing Combivent with plain albuterol if Trelegy is effective. -Order annual chest x-ray due to smoking history and chronic bronchitis.  Obstructive Sleep Apnea (OSA) Patient has severe sleep apnea. He is 93% compliant with BIPAP use, current pressure 22/18cm h20 with residual AHI 1.0/hour. Continue BiPAP therapy night 4-6 hour   Tobacco Use Patient continues to smoke, previously unsuccessful attempts at cessation with patches and Chantix. -Encouraged to consider cessation when ready, no changes to current management.  General Health Maintenance -Discussed lung cancer screening program eligibility at age 59, advised patient to discuss with future healthcare provider. -Advised against mixing alcohol with medications, and potential worsening of sleep apnea with alcohol consumption.      Glenford Bayley, NP 11/28/2023

## 2023-12-11 ENCOUNTER — Telehealth: Payer: Self-pay

## 2023-12-11 NOTE — Telephone Encounter (Signed)
 ATC X1. LMTCB. Also sending mychart message.

## 2023-12-15 ENCOUNTER — Telehealth (INDEPENDENT_AMBULATORY_CARE_PROVIDER_SITE_OTHER): Payer: Medicaid Other | Admitting: Primary Care

## 2023-12-15 DIAGNOSIS — F17210 Nicotine dependence, cigarettes, uncomplicated: Secondary | ICD-10-CM | POA: Diagnosis not present

## 2023-12-15 DIAGNOSIS — J449 Chronic obstructive pulmonary disease, unspecified: Secondary | ICD-10-CM | POA: Diagnosis not present

## 2023-12-15 DIAGNOSIS — G4733 Obstructive sleep apnea (adult) (pediatric): Secondary | ICD-10-CM | POA: Diagnosis not present

## 2023-12-15 NOTE — Progress Notes (Signed)
 Virtual Visit via Video Note  I connected with Steven Dudley on 12/15/23 at  4:00 PM EST by a video enabled telemedicine application and verified that I am speaking with the correct person using two identifiers.  Location: Patient: Home Provider: Office    I discussed the limitations of evaluation and management by telemedicine and the availability of in person appointments. The patient expressed understanding and agreed to proceed.  History of Present Illness: 48 year old male, heavy smoker.  Past medical history significant for OSA.  Patient of Dr. Wynona Neat.  Previous [facility] pulmonary encounter: 05/03/2023 Patient presents today for overdue follow-up for Sleep apnea, chronic hypoxemic respiratory failure and COPD. Split night sleep showed severe sleep study. He is able to sleep a little longer since starting PAP therapy. He does not wake up up as frequently. He gets 3 hours of sleep at a time. He is dreaming more. Waking up with dry mouth. Feels more rested, not as exhausted.   Moderate obstruction on pulmonary function testing On Advair twice a day  Rescue inhaler does helps as much anymore  Continued on nocturnal oxygen Not ready to quit smoking   Airview download 04/02/23-05/01/23 Usage 30/30 days; 97% > 4 hours IPAP 22 cm H2O/EPAP 18 cm H2O Air leaks 78 L/min (95%) AHI 0.9   OSA (obstructive sleep apnea) - Split night sleep study in April 2024 showed severe obstructive sleep apnea. Optimal BiPAP setting 22/18 - Patient is 97% compliant with BiPAP use greater than 4 hours over the last 30 days. He has noticed improvement in sleep quality.  He is experiencing some bloating and dry mouth symptoms. We will adjust BiPAP settings 21/17cm h20.  Recommend he try using Biotene mouthwash and saline nasal spray before bed.    11/28/2023 Discussed the use of AI scribe software for clinical note transcription with the patient, who gave verbal consent to proceed.  History of Present Illness    Steven Dudley is a 48 year old male with COPD and sleep apnea who presents for a follow-up visit.  He has moderate COPD, confirmed by a pulmonary function test. He uses Advair twice daily, and Combivent as a rescue inhaler as needed. The rescue inhaler is less effective than before. He experiences mild congestion, especially when exposed to dust at work, and occasional wheezing and chest tightness. He uses oxygen at night with his BiPAP machine.  Regarding sleep apnea, a split night sleep study showed severe sleep apnea. Since starting BiPAP therapy, he sleeps longer, wakes up less frequently, and feels more rested, though he still experiences dry mouth. He uses the BiPAP almost every night, except occasionally when he drinks alcohol.  He has a significant smoking history and has attempted smoking cessation with patches and Chantix without success. He reduced smoking from two packs a day to about ten cigarettes a day. He tried vaping but found it counterproductive. 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  He has a history of alcohol use, drinking socially with his wife about three times a month, reduced from previous daily use.      Airview download 10/28/23-11/26/23 Days used 28/30 days (93%) Average usage 4 hours 45 mins  Pressure 22/18cm h20 Airleaks 98.5L/min AHI 1.0   1. Tobacco use disorder (Primary) - DG Chest 2 View; Future   2. Panlobular emphysema (HCC) - DG Chest 2 View; Future   3. Chronic obstructive pulmonary disease, unspecified COPD type (HCC) - DG Chest 2 View;  Future   Chronic Obstructive Pulmonary Disease (COPD) Stable symptoms with occasional wheezing and chest tightness. Current treatment with Advair and Combivent, but patient reports Combivent induces coughing fits. Exposure to dust and particles at work exacerbates symptoms.  -Trial of Trelegy (triple therapy inhaler) once daily for two weeks,  sample provided. -Continue Combivent as needed, but consider replacing Combivent with plain albuterol if Trelegy is effective. -Order annual chest x-ray due to smoking history and chronic bronchitis.   Obstructive Sleep Apnea (OSA) Patient has severe sleep apnea. He is 93% compliant with BIPAP use, current pressure 22/18cm h20 with residual AHI 1.0/hour. Continue BiPAP therapy night 4-6 hour    Tobacco Use Patient continues to smoke, previously unsuccessful attempts at cessation with patches and Chantix. -Encouraged to consider cessation when ready, no changes to current management.   General Health Maintenance -Discussed lung cancer screening program eligibility at age 46, advised patient to discuss with future healthcare provider. -Advised against mixing alcohol with medications, and potential worsening of sleep apnea with alcohol consumption.   12/15/2023- interim  Discussed the use of AI scribe software for clinical note transcription with the patient, who gave verbal consent to proceed.  The patient is a 48 year old with COPD and chronic bronchitis who presents for a follow-up visit.  He has COPD and chronic bronchitis, exacerbated by exposure to dust particles at work. He was previously on Advair and Combivent but experienced coughing fits with Combivent. A trial of Trelegy was initiated, and he finds it more effective than previous treatments. A recent chest x-ray showed no active cardiopulmonary disease.  He continues to smoke despite previous unsuccessful attempts with cessation methods such as patches and Chantix. He has previously quit for a short period using a tapering method.  Regarding his sleep apnea, he is compliant with his BiPAP therapy, using it 28 out of the last 30 days with an average of 5 hours and 40 minutes per night. His inspiratory pressure is 22/18cm h20 with residual apnea score 1/hour, consistent with previous results.       Observations/Objective:  Appears  well without overt respiratory symptoms  Assessment and Plan:  Assessment and Plan    Chronic Obstructive Pulmonary Disease (COPD) Improved symptoms with Trelegy trial. Concurrent use of Advair and Trelegy noted, which led to increased steroid and bronchodilator exposure. -Discontinue Advair and Combivent. -Prescribe Trelegy daily. -Prescribe Albuterol as rescue inhaler.  Obstructive Sleep Apnea (OSA) Compliant with BiPAP use, with good control of apnea events. -Continue current BiPAP settings 22/18cm h20.  Tobacco Use Disorder Continued smoking with previous unsuccessful attempts at cessation using patches and Chantix. Discussed the importance of smoking cessation for COPD management and lung cancer prevention. -Encourage gradual reduction in smoking. -Consider future smoking cessation interventions when patient is ready.     Follow Up Instructions:  Follow-up in 6 months (September 2025).    I discussed the assessment and treatment plan with the patient. The patient was provided an opportunity to ask questions and all were answered. The patient agreed with the plan and demonstrated an understanding of the instructions.   The patient was advised to call back or seek an in-person evaluation if the symptoms worsen or if the condition fails to improve as anticipated.  I provided 22 minutes of non-face-to-face time during this encounter.   Glenford Bayley, NP

## 2023-12-19 ENCOUNTER — Telehealth: Payer: Self-pay | Admitting: Primary Care

## 2023-12-19 MED ORDER — TRELEGY ELLIPTA 100-62.5-25 MCG/ACT IN AEPB: 1.0000 | INHALATION_SPRAY | Freq: Every day | RESPIRATORY_TRACT | 11 refills | Status: AC

## 2023-12-19 MED ORDER — IPRATROPIUM-ALBUTEROL 20-100 MCG/ACT IN AERS: 1.0000 | INHALATION_SPRAY | Freq: Four times a day (QID) | RESPIRATORY_TRACT | 11 refills | Status: AC | PRN

## 2023-12-19 NOTE — Telephone Encounter (Signed)
 I called and spoke to pt. Pt stated his Trelegy and Albuterol inhalers were never called in to his pharmacy. I inform,ed pt I would send these in for him now. I also informed pt of cxr results that were sent by Mychart so he was aware since the message was never read by pt. NFN

## 2023-12-19 NOTE — Telephone Encounter (Signed)
 Patient had a video visit Friday and albuterol inhaler as well as Trelegy inhaler were supposed to be called in.   Pharmacy:[facility] on [address]

## 2023-12-20 ENCOUNTER — Telehealth: Payer: Self-pay

## 2023-12-20 ENCOUNTER — Other Ambulatory Visit (HOSPITAL_COMMUNITY): Payer: Self-pay

## 2023-12-20 ENCOUNTER — Other Ambulatory Visit: Payer: Self-pay | Admitting: Cardiovascular Disease

## 2023-12-20 NOTE — Telephone Encounter (Signed)
 Pharmacy Patient Advocate Encounter  Received notification from [facility] that Prior Authorization for Trelegy Ellipta 100-62.5-25MCG/ACT aerosol powder  has been APPROVED from 12/06/2023 to 12/19/2024. Ran test claim, Copay is $4.00. This test claim was processed through [facility]- copay amounts may vary at other pharmacies due to pharmacy/plan contracts, or as the patient moves through the different stages of their insurance plan.

## 2023-12-20 NOTE — Telephone Encounter (Signed)
*  Pulm  Pharmacy Patient Advocate Encounter   Received notification from Fax that prior authorization for Trelegy 100 is required/requested.   Insurance verification completed.   The patient is insured through [facility] .   Per test claim: PA required; PA submitted to above mentioned insurance via CoverMyMeds Key/confirmation #/EOC [id number] Status is pending

## 2023-12-20 NOTE — Telephone Encounter (Signed)
 ATC x1 LVM to let patient know Trelegy has been approved with a copay of 4.00.  Nothing else further needed

## 2024-05-01 LAB — COLOGUARD

## 2024-05-13 ENCOUNTER — Encounter (HOSPITAL_BASED_OUTPATIENT_CLINIC_OR_DEPARTMENT_OTHER): Payer: Self-pay | Admitting: Emergency Medicine

## 2024-05-13 ENCOUNTER — Emergency Department (HOSPITAL_BASED_OUTPATIENT_CLINIC_OR_DEPARTMENT_OTHER)
Admission: EM | Admit: 2024-05-13 | Discharge: 2024-05-13 | Disposition: A | Discharge status: Home / Self Care | Attending: Emergency Medicine | Admitting: Emergency Medicine

## 2024-05-13 ENCOUNTER — Emergency Department (HOSPITAL_BASED_OUTPATIENT_CLINIC_OR_DEPARTMENT_OTHER)

## 2024-05-13 ENCOUNTER — Other Ambulatory Visit: Payer: Self-pay

## 2024-05-13 DIAGNOSIS — Z7985 Long-term (current) use of injectable non-insulin antidiabetic drugs: Secondary | ICD-10-CM | POA: Insufficient documentation

## 2024-05-13 DIAGNOSIS — J4489 Other specified chronic obstructive pulmonary disease: Secondary | ICD-10-CM | POA: Insufficient documentation

## 2024-05-13 DIAGNOSIS — I509 Heart failure, unspecified: Secondary | ICD-10-CM | POA: Insufficient documentation

## 2024-05-13 DIAGNOSIS — M25531 Pain in right wrist: Secondary | ICD-10-CM | POA: Diagnosis present

## 2024-05-13 DIAGNOSIS — M7989 Other specified soft tissue disorders: Secondary | ICD-10-CM | POA: Insufficient documentation

## 2024-05-13 DIAGNOSIS — X500XXA Overexertion from strenuous movement or load, initial encounter: Secondary | ICD-10-CM | POA: Diagnosis not present

## 2024-05-13 DIAGNOSIS — I110 Hypertensive heart disease with heart failure: Secondary | ICD-10-CM | POA: Diagnosis not present

## 2024-05-13 DIAGNOSIS — E119 Type 2 diabetes mellitus without complications: Secondary | ICD-10-CM | POA: Diagnosis not present

## 2024-05-13 MED ORDER — MELOXICAM 15 MG PO TABS: 15.0000 mg | ORAL_TABLET | Freq: Every day | ORAL | 0 refills | Status: AC

## 2024-05-13 MED ORDER — DICLOFENAC SODIUM 1 % EX GEL: 2.0000 g | Freq: Four times a day (QID) | CUTANEOUS | 0 refills | Status: AC | PRN

## 2024-05-13 MED ORDER — DEXAMETHASONE SODIUM PHOSPHATE 10 MG/ML IJ SOLN
10.0000 mg | Freq: Once | INTRAMUSCULAR | Status: AC
  Administered 2024-05-13: 10 mg via INTRAMUSCULAR
  Filled 2024-05-13: qty 1

## 2024-05-13 MED ORDER — KETOROLAC TROMETHAMINE 30 MG/ML IJ SOLN
30.0000 mg | Freq: Once | INTRAMUSCULAR | Status: AC
  Administered 2024-05-13: 30 mg via INTRAMUSCULAR
  Filled 2024-05-13: qty 1

## 2024-05-13 NOTE — ED Triage Notes (Signed)
 Pt reports R wrist pain that started yesterday. No known injury. He reports that this morning when he woke up his wrist was swollen and had limited ROM. Pt is wearing a brace from home.

## 2024-05-13 NOTE — ED Provider Notes (Signed)
 Emergency Department Provider Note   I have reviewed the triage vital signs and the nursing notes.   HISTORY  Chief Complaint Wrist Pain   HPI Steven Dudley is a 48 y.o. male past history reviewed below presents emergency department with right wrist pain and hand swelling.  He began having discomfort in the right wrist yesterday which progressed.  This morning he has pain with flexion/extension of the wrist.  No numbness or weakness.  No rash.  No fevers.  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  He works often 7 days/week.  He does not remember any specific injury.  Has had some arthritis issues in the past but nothing debilitating.  He woke up this morning with moderate to severe pain and felt unable to present to work given his duties there.  Denies any IV drug use.  He is on a GLP-1 for diabetes management and well-controlled hypertension with his over-the-counter medications.   Past Medical History:  Diagnosis Date   Acid reflux    Acute respiratory failure (HCC)    Alcohol abuse    Anxiety    Asthma    daily and prn inhalers   CHF (congestive heart failure) (HCC)    Chondromalacia of right patella 09/2014   COPD (chronic obstructive pulmonary disease) (HCC)    Diabetes mellitus without complication (HCC)    TYPE II   Gastritis    History of traumatic brain injury 2006   Hyperglycemia    Hypertension    states under control with med., has been on med. x 1 yr.   Meniscal cyst 09/2014   right knee   Morbid obesity (HCC)    Obesity    Renal disorder    Sleep apnea    Smokers' cough (HCC)     Review of Systems  Constitutional: No fever/chills Cardiovascular: Denies chest pain. Respiratory: Denies shortness of breath. Musculoskeletal: Positive right wrist pain.  Skin: Negative for rash. Neurological: Negative for numbness or weakness. Some tingling in the fingers on the right.    ____________________________________________   PHYSICAL EXAM:  VITAL SIGNS: ED Triage Vitals [05/13/24 0709]  Encounter Vitals Group     BP 134/88     Pulse Rate 75     Resp 20     Temp 97.9 F (36.6 C)     Temp Source Oral     SpO2 96 %     Weight 238 lb (108 kg)     Height 5' 7 (1.702 m)   Constitutional: Alert and oriented. Well appearing and in no acute distress. Eyes: Conjunctivae are normal.  Head: Atraumatic. Nose: No congestion/rhinnorhea. Mouth/Throat: Mucous membranes are moist.  Neck: No stridor.   Cardiovascular: Normal rate, regular rhythm. Good peripheral circulation. Grossly normal heart sounds.   Respiratory: Normal respiratory effort.  No retractions. Lungs CTAB. Gastrointestinal: Soft and nontender. No distention.  Musculoskeletal: No large right wrist effusion.  No warmth or erythema.  No point tenderness. No gross deformities of extremities. Neurologic:  Normal speech and language.  Skin:  Skin is warm, dry and intact. No rash noted.  ____________________________________________  RADIOLOGY  DG Wrist Complete Right Result Date: 05/13/2024 EXAM: 3 or more VIEW(S) XRAY OF THE RIGHT WRIST 05/13/2024 07:40:00 AM COMPARISON: None available. CLINICAL HISTORY: Rt wrist pain that started yesterday. Swelling today w/limited rom. FINDINGS: BONES AND JOINTS: Mild cystic degenerative changes in the distal ulna and head of the first metacarpal. Early chondrocalcinosis in the TFCC.  No acute fracture. No joint dislocation. SOFT TISSUES: The soft tissues are unremarkable. IMPRESSION: 1. No acute fracture or dislocation. 2. Mild cystic degenerative changes in the distal ulna and head of the first metacarpal. 3. Early chondrocalcinosis in the TFCC. Electronically signed by: Katheleen Faes MD 05/13/2024 07:43 AM EDT RP Workstation: [id number]    ____________________________________________   PROCEDURES  Procedure(s) performed:   Procedures  None   ____________________________________________   INITIAL IMPRESSION / ASSESSMENT AND PLAN / ED COURSE  Pertinent labs & imaging results that were available during my care of the patient were reviewed by me and considered in my medical decision making (see chart for details).   This patient is Presenting for Evaluation of wrist pain, which does require a range of treatment options, and is a complaint that involves a moderate risk of morbidity and mortality.  The Differential Diagnoses include OA, RA, septic joint, fracture, dislocation, etc.  Critical Interventions-    Medications  dexamethasone  (DECADRON ) injection 10 mg (has no administration in time range)  ketorolac  (TORADOL ) 30 MG/ML injection 30 mg (30 mg Intramuscular Given 05/13/24 0738)    Reassessment after intervention:  pain improved.   Radiologic Tests Ordered, included wrist XR. I independently interpreted the images and agree with radiology interpretation.   Medical Decision Making: Summary:  Patient presents the emergency department with right wrist pain.  He has a job that requires frequent, heavy lifting and movement.  Movement is often repetitive.  Low suspicion for fracture but will obtain x-ray to assess degree of arthritis and to rule out acute bony lesion.  Reevaluation with update and discussion with patient.  X-ray reassuring.  Discussed RICE mgmt at home and meloxicam . Will give contact info for hand surgery on call today.   Patient's presentation is most consistent with acute illness / injury with system symptoms.   Disposition: discharge  ____________________________________________  FINAL CLINICAL IMPRESSION(S) / ED DIAGNOSES  Final diagnoses:  Right wrist pain     NEW OUTPATIENT MEDICATIONS STARTED DURING THIS VISIT:  New Prescriptions   DICLOFENAC  SODIUM (VOLTAREN ) 1 % GEL    Apply 2 g topically 4 (four) times daily as needed.   MELOXICAM  (MOBIC ) 15 MG TABLET    Take 1 tablet (15 mg total) by  mouth daily for 7 days.    Note:  This document was prepared using Dragon voice recognition software and may include unintentional dictation errors.  Fonda Law, MD, [facility]    Suan Pyeatt, Fonda MATSU, MD 05/13/24 [phone]

## 2024-05-13 NOTE — Discharge Instructions (Signed)
 You were seen emerged from today with wrist pain.  Your x-ray shows some arthritis and I would like for you to follow-up with the orthopedic surgeon listed if your symptoms continue.  I am giving you a steroid shot today and I am sending you home on 7 days of meloxicam .  Do not take additional medicine such as ibuprofen , Aleve/Advil , aspirin .  You may take Tylenol  unless you have been told not to by another physician.  You may also use topical arthritis medications on your wrist.  I would like you to be out of work for the next several days to allow for some rest and return of mobility.  Please call the orthopedic doctor today to schedule a follow-up appointment in the next couple of weeks.  Return to the emergency department with any fevers or joint redness.

## 2024-08-02 ENCOUNTER — Ambulatory Visit: Admitting: Cardiovascular Disease

## 2024-08-06 NOTE — Progress Notes (Deleted)
  Cardiology Office Note:  .   Date:  08/06/2024  ID:  Steven Dudley, DOB 1976/05/13, MRN [id number] PCP: Rena Luke POUR, MD  [facility] Providers Cardiologist:  None { Click to update primary MD,subspecialty MD or APP then REFRESH:1}   History of Present Illness: Steven   Andron Dudley is a 48 y.o. male   with history of GERD, tobacco and alcohol abuse, anxiety, asthma/COPD, DM, traumatic brain injury, HTN, obesity  and sleep apne December 2023 with a COPD exacerbation, positive for RSV and had atrial fibrillation with RVR during the admission. He converted to sinus on IV Cardizem .  He was discharged on Eliquis  and Cardizem . Echo December 2023 with LVEF=55-60%. No valve disease.   Patient had no Afib on f/u Zio so eliquis  stopped.    ROS: ***  Studies Reviewed: Steven         Prior CV Studies: {Select studies to display:26339}   Echo 10/06/22:  1. Nondiagnostic study due to very poor echo windows despite use of  definity  contrast.   2. Left ventricular ejection fraction, by estimation, is 55 to 60%. The  left ventricle has normal function. Left ventricular endocardial border  not optimally defined to evaluate regional wall motion. Left ventricular  diastolic parameters were normal.   3. Right ventricular systolic function is normal. The right ventricular  size is not well visualized.   4. The mitral valve was not well visualized. Trivial mitral valve  regurgitation.   5. The aortic valve was not well visualized. Aortic valve regurgitation  is not visualized. No aortic stenosis is present.   6. The inferior vena cava is dilated in size with <50% respiratory  variability, suggesting right atrial pressure of 15 mmHg.     Risk Assessment/Calculations:   {Does this patient have ATRIAL FIBRILLATION?:[id number]} No BP recorded.  {Refresh Note OR Click here to enter BP  :1}***       Physical Exam:   VS:  There were no vitals taken for this visit.   Orhtostatics: No data  found. Wt Readings from Last 3 Encounters:  05/13/24 238 lb (108 kg)  11/28/23 244 lb 12.8 oz (111 kg)  11/09/23 245 lb (111.1 kg)    GEN: Well nourished, well developed in no acute distress NECK: No JVD; No carotid bruits CARDIAC: ***RRR, no murmurs, rubs, gallops RESPIRATORY:  Clear to auscultation without rales, wheezing or rhonchi  ABDOMEN: Soft, non-tender, non-distended EXTREMITIES:  No edema; No deformity   ASSESSMENT AND PLAN: .    Afib in setting of COPD exacerbation, no Afib on f/u Zio so eliquis  stopped  HTN  OSA  COPD  Tobacco  ETOH  Obesity     {Are you ordering a CV Procedure (e.g. stress test, cath, DCCV, TEE, etc)?   Press F2        :[id number]}  Dispo: ***  Signed, Olivia Pavy, PA-C

## 2024-08-08 ENCOUNTER — Other Ambulatory Visit: Payer: Self-pay

## 2024-08-08 ENCOUNTER — Emergency Department (HOSPITAL_BASED_OUTPATIENT_CLINIC_OR_DEPARTMENT_OTHER)
Admission: EM | Admit: 2024-08-08 | Discharge: 2024-08-08 | Disposition: A | Discharge status: Home / Self Care | Attending: Emergency Medicine | Admitting: Emergency Medicine

## 2024-08-08 ENCOUNTER — Encounter (HOSPITAL_BASED_OUTPATIENT_CLINIC_OR_DEPARTMENT_OTHER): Payer: Self-pay

## 2024-08-08 DIAGNOSIS — E119 Type 2 diabetes mellitus without complications: Secondary | ICD-10-CM | POA: Diagnosis not present

## 2024-08-08 DIAGNOSIS — I110 Hypertensive heart disease with heart failure: Secondary | ICD-10-CM | POA: Diagnosis not present

## 2024-08-08 DIAGNOSIS — D72829 Elevated white blood cell count, unspecified: Secondary | ICD-10-CM | POA: Diagnosis not present

## 2024-08-08 DIAGNOSIS — R110 Nausea: Secondary | ICD-10-CM | POA: Diagnosis not present

## 2024-08-08 DIAGNOSIS — J449 Chronic obstructive pulmonary disease, unspecified: Secondary | ICD-10-CM | POA: Diagnosis not present

## 2024-08-08 DIAGNOSIS — I509 Heart failure, unspecified: Secondary | ICD-10-CM | POA: Diagnosis not present

## 2024-08-08 DIAGNOSIS — R112 Nausea with vomiting, unspecified: Secondary | ICD-10-CM | POA: Diagnosis present

## 2024-08-08 LAB — COMPREHENSIVE METABOLIC PANEL WITH GFR
ALT: 25 U/L (ref 0–44)
AST: 18 U/L (ref 15–41)
Albumin: 4.7 g/dL (ref 3.5–5.0)
Alkaline Phosphatase: 86 U/L (ref 38–126)
Anion gap: 14 (ref 5–15)
BUN: 10 mg/dL (ref 6–20)
CO2: 23 mmol/L (ref 22–32)
Calcium: 9.6 mg/dL (ref 8.9–10.3)
Chloride: 102 mmol/L (ref 98–111)
Creatinine, Ser: 0.71 mg/dL (ref 0.61–1.24)
GFR, Estimated: >60 mL/min (ref >60)
Glucose, Bld: 178 mg/dL — ABNORMAL HIGH (ref 70–99)
Potassium: 3.9 mmol/L (ref 3.5–5.1)
Sodium: 139 mmol/L (ref 135–145)
Total Bilirubin: 0.6 mg/dL (ref 0.0–1.2)
Total Protein: 8.2 g/dL — ABNORMAL HIGH (ref 6.5–8.1)

## 2024-08-08 LAB — URINALYSIS, ROUTINE W REFLEX MICROSCOPIC
Bilirubin Urine: NEGATIVE
Glucose, UA: >=500 mg/dL — AB
Ketones, ur: NEGATIVE mg/dL
Leukocytes,Ua: NEGATIVE
Nitrite: NEGATIVE
Protein, ur: 30 mg/dL — AB
Specific Gravity, Urine: 1.015 (ref 1.005–1.030)
pH: 7.5 (ref 5.0–8.0)

## 2024-08-08 LAB — CBC
HCT: 50.1 % (ref 39.0–52.0)
Hemoglobin: 17.3 g/dL — ABNORMAL HIGH (ref 13.0–17.0)
MCH: 29.6 pg (ref 26.0–34.0)
MCHC: 34.5 g/dL (ref 30.0–36.0)
MCV: 85.8 fL (ref 80.0–100.0)
Platelets: 306 K/uL (ref 150–400)
RBC: 5.84 MIL/uL — ABNORMAL HIGH (ref 4.22–5.81)
RDW: 13 % (ref 11.5–15.5)
WBC: 12.8 K/uL — ABNORMAL HIGH (ref 4.0–10.5)
nRBC: 0 % (ref 0.0–0.2)

## 2024-08-08 LAB — URINALYSIS, MICROSCOPIC (REFLEX): Squamous Epithelial / HPF: NONE SEEN /HPF (ref 0–5)

## 2024-08-08 LAB — LIPASE, BLOOD: Lipase: 32 U/L (ref 11–51)

## 2024-08-08 MED ORDER — ONDANSETRON 4 MG PO TBDP
4.0000 mg | ORAL_TABLET | Freq: Once | ORAL | Status: AC | PRN
  Administered 2024-08-08: 4 mg via ORAL
  Filled 2024-08-08: qty 1

## 2024-08-08 MED ORDER — PANTOPRAZOLE SODIUM 40 MG IV SOLR
40.0000 mg | Freq: Once | INTRAVENOUS | Status: AC
  Administered 2024-08-08: 40 mg via INTRAVENOUS
  Filled 2024-08-08: qty 10

## 2024-08-08 MED ORDER — DROPERIDOL 2.5 MG/ML IJ SOLN
1.2500 mg | Freq: Once | INTRAMUSCULAR | Status: AC
  Administered 2024-08-08: 1.25 mg via INTRAVENOUS
  Filled 2024-08-08: qty 2

## 2024-08-08 MED ORDER — ONDANSETRON 4 MG PO TBDP: 4.0000 mg | ORAL_TABLET | Freq: Three times a day (TID) | ORAL | 0 refills | Status: AC | PRN

## 2024-08-08 MED ORDER — SODIUM CHLORIDE 0.9 % IV BOLUS
1000.0000 mL | Freq: Once | INTRAVENOUS | Status: AC
  Administered 2024-08-08: 1000 mL via INTRAVENOUS

## 2024-08-08 MED ORDER — METOCLOPRAMIDE HCL 5 MG/ML IJ SOLN
10.0000 mg | Freq: Once | INTRAMUSCULAR | Status: AC
  Administered 2024-08-08: 10 mg via INTRAVENOUS
  Filled 2024-08-08: qty 2

## 2024-08-08 NOTE — ED Notes (Signed)
Successful PO challenge

## 2024-08-08 NOTE — ED Notes (Signed)
 Patient had a decrease in SAT after pain medication. Placed on 2L

## 2024-08-08 NOTE — ED Triage Notes (Signed)
 Woke up 4 am with nausea, dry heaves and chills. Only bringing up mucous Chest and upper abdomen is burning Denies diarrhea

## 2024-08-08 NOTE — ED Provider Notes (Signed)
 48 year old male with past medical history of gastritis in the past presenting to the emergency department with nausea and vomiting and symptoms consistent with previous flares.  The patient's workup here has been reassuring.  He was receiving a p.o. challenge after antiemetics at the time of signout.  Plan is for discharge if the patient is feeling better and tolerating p.o.  Physical Exam  BP (!) 178/87 (BP Location: Left Arm)   Pulse 64   Temp 98.4 F (36.9 C)   Resp 18   Wt 103 kg   SpO2 94%   BMI 35.55 kg/m   Physical Exam General: No acute distress  Procedures  Procedures  ED Course / MDM    Medical Decision Making Amount and/or Complexity of Data Reviewed Labs: ordered.  Risk Prescription drug management.   The patient is tolerating p.o. now and will be discharged with return precautions.       Ula Prentice SAUNDERS, MD 08/08/24 [phone]

## 2024-08-08 NOTE — Discharge Instructions (Addendum)
 Your workup today was reassuring.  Please take the Zofran  as needed for nausea and follow-up with your doctor.  Return to the ER for worsening symptoms.

## 2024-08-08 NOTE — ED Provider Notes (Signed)
 Emergency Department Provider Note   I have reviewed the triage vital signs and the nursing notes.   HISTORY  Chief Complaint Nausea   HPI Siddh Vandeventer is a 48 y.o. male presents the emergency department with nausea and vomiting.  He has some epigastric abdominal discomfort.  He notes a history of gastritis and that symptoms are similar to prior episodes.  He drinks rarely and uses marijuana occasionally.  No fevers or chills.  No radiation into the chest.   Past Medical History:  Diagnosis Date   Acid reflux    Acute respiratory failure (HCC)    Alcohol abuse    Anxiety    Asthma    daily and prn inhalers   CHF (congestive heart failure) (HCC)    Chondromalacia of right patella 09/2014   COPD (chronic obstructive pulmonary disease) (HCC)    Diabetes mellitus without complication (HCC)    TYPE II   Gastritis    History of traumatic brain injury 2006   Hyperglycemia    Hypertension    states under control with med., has been on med. x 1 yr.   Meniscal cyst 09/2014   right knee   Morbid obesity (HCC)    Obesity    Renal disorder    Sleep apnea    Smokers' cough (HCC)     Review of Systems  Constitutional: No fever/chills Cardiovascular: Denies chest pain. Respiratory: Denies shortness of breath. Gastrointestinal: Positive abdominal pain. Positive nausea and vomiting.  No diarrhea.  No constipation. Neurological: Negative for headaches.  ____________________________________________   PHYSICAL EXAM:  VITAL SIGNS: ED Triage Vitals  Encounter Vitals Group     BP 08/08/24 1102 (!) 157/90     Pulse Rate 08/08/24 1102 66     Resp 08/08/24 1102 18     Temp 08/08/24 1102 98.3 F (36.8 C)     Temp Source 08/08/24 1102 Oral     SpO2 08/08/24 1102 98 %     Weight 08/08/24 1102 227 lb (103 kg)   Constitutional: Alert and oriented. Well appearing and in no acute distress. Eyes: Conjunctivae are normal.  Head: Atraumatic. Nose: No  congestion/rhinnorhea. Mouth/Throat: Mucous membranes are moist.   Neck: No stridor.   Cardiovascular: Normal rate, regular rhythm. Good peripheral circulation. Grossly normal heart sounds.   Respiratory: Normal respiratory effort.  No retractions. Lungs CTAB. Gastrointestinal: Soft and nontender. No distention.  Musculoskeletal: No gross deformities of extremities. Neurologic:  Normal speech and language.  Skin:  Skin is warm, dry and intact. No rash noted.  ____________________________________________   LABS (all labs ordered are listed, but only abnormal results are displayed)  Labs Reviewed  COMPREHENSIVE METABOLIC PANEL WITH GFR - Abnormal; Notable for the following components:      Result Value   Glucose, Bld 178 (*)    Total Protein 8.2 (*)    All other components within normal limits  CBC - Abnormal; Notable for the following components:   WBC 12.8 (*)    RBC 5.84 (*)    Hemoglobin 17.3 (*)    All other components within normal limits  URINALYSIS, ROUTINE W REFLEX MICROSCOPIC - Abnormal; Notable for the following components:   Glucose, UA >=500 (*)    Hgb urine dipstick TRACE (*)    Protein, ur 30 (*)    All other components within normal limits  URINALYSIS, MICROSCOPIC (REFLEX) - Abnormal; Notable for the following components:   Bacteria, UA RARE (*)    All other components within  normal limits  LIPASE, BLOOD   ____________________________________________   PROCEDURES  Procedure(s) performed:   Procedures  None  ____________________________________________   INITIAL IMPRESSION / ASSESSMENT AND PLAN / ED COURSE  Pertinent labs & imaging results that were available during my care of the patient were reviewed by me and considered in my medical decision making (see chart for details).   This patient is Presenting for Evaluation of abdominal pain, which does require a range of treatment options, and is a complaint that involves a high risk of morbidity and  mortality.  The Differential Diagnoses includes but is not exclusive to acute cholecystitis, intrathoracic causes for epigastric abdominal pain, gastritis, duodenitis, pancreatitis, small bowel or large bowel obstruction, abdominal aortic aneurysm, hernia, gastritis, etc.   Critical Interventions-    Medications  ondansetron  (ZOFRAN -ODT) disintegrating tablet 4 mg (4 mg Oral Given 08/08/24 1106)  sodium chloride  0.9 % bolus 1,000 mL (0 mLs Intravenous Stopped 08/08/24 1452)  droperidol (INAPSINE) 2.5 MG/ML injection 1.25 mg (1.25 mg Intravenous Given 08/08/24 1158)  pantoprazole  (PROTONIX ) injection 40 mg (40 mg Intravenous Given 08/08/24 1157)  metoCLOPramide  (REGLAN ) injection 10 mg (10 mg Intravenous Given 08/08/24 1449)    Reassessment after intervention: pain improved.    Clinical Laboratory Tests Ordered, UA without infection.  CBC with mild leukocytosis to 12.8 without anemia.  No acute kidney injury.  LFTs and bilirubin along with lipase normal.  Radiologic Tests: Considered CT imaging of the abdomen pelvis but this is a recurring problem for the patient and abdominal exam is largely reassuring.  Defer imaging for now.  Cardiac Monitor Tracing which shows NSR.    Social Determinants of Health Risk patient has a smoking history with occasional EtOH and THC.   Medical Decision Making: Summary:  Patient presents the emergency department with epigastric pain and vomiting.  It is consistent with prior gastritis episodes in the past.  Possibly THC induced hyperemesis.  Plan for treatment in the ED.  Reevaluation with update and discussion with patient and family at bedside.  Symptoms are improving although some residual nausea remains.  Care transferred to the oncoming physician pending reassessment after additional nausea medication.  Considered admission but ED workup and treatment in process.  Patient's presentation is most consistent with acute presentation with potential  threat to life or bodily function.   Disposition: pending   ____________________________________________  FINAL CLINICAL IMPRESSION(S) / ED DIAGNOSES  Final diagnoses:  Nausea     NEW OUTPATIENT MEDICATIONS STARTED DURING THIS VISIT:  Discharge Medication List as of 08/08/2024  4:29 PM     START taking these medications   Details  ondansetron  (ZOFRAN -ODT) 4 MG disintegrating tablet Take 1 tablet (4 mg total) by mouth every 8 (eight) hours as needed for nausea or vomiting., Starting Thu 08/08/2024, Normal        Note:  This document was prepared using Dragon voice recognition software and may include unintentional dictation errors.  Fonda Law, MD, [facility]    Shanen Norris, Fonda MATSU, MD 08/13/24 [phone]

## 2024-08-20 ENCOUNTER — Ambulatory Visit: Admitting: Physician Assistant

## 2024-09-03 NOTE — Progress Notes (Deleted)
  Cardiology Office Note:  .   Date:  09/03/2024  ID:  Steven Dudley, DOB [birth year], MRN [id number] PCP: Steven Luke POUR, MD  [facility] Providers Cardiologist:  None { Click to update primary MD,subspecialty MD or APP then REFRESH:1}   History of Present Illness: Steven Dudley   Steven Dudley is a 47 y.o. male   with history of GERD, tobacco and alcohol abuse, anxiety, asthma/COPD, DM, traumatic brain injury, HTN, obesity  and sleep apne December 2023 with a COPD exacerbation, positive for RSV and had atrial fibrillation with RVR during the admission. He converted to sinus on IV Cardizem .  He was discharged on Eliquis  and Cardizem . Echo December 2023 with LVEF=55-60%. No valve disease.   Patient had no Afib on f/u Zio so eliquis  stopped.    ROS: ***  Studies Reviewed: Steven Dudley         Prior CV Studies: {Select studies to display:26339}   Echo 2022/11/03:  1. Nondiagnostic study due to very poor echo windows despite use of  definity  contrast.   2. Left ventricular ejection fraction, by estimation, is 55 to 60%. The  left ventricle has normal function. Left ventricular endocardial border  not optimally defined to evaluate regional wall motion. Left ventricular  diastolic parameters were normal.   3. Right ventricular systolic function is normal. The right ventricular  size is not well visualized.   4. The mitral valve was not well visualized. Trivial mitral valve  regurgitation.   5. The aortic valve was not well visualized. Aortic valve regurgitation  is not visualized. No aortic stenosis is present.   6. The inferior vena cava is dilated in size with <50% respiratory  variability, suggesting right atrial pressure of 15 mmHg.     Risk Assessment/Calculations:   {Does this patient have ATRIAL FIBRILLATION?:[phone]} No BP recorded.  {Refresh Note OR Click here to enter BP  :1}***       Physical Exam:   VS:  There were no vitals taken for this visit.   Orhtostatics: No data  found. Wt Readings from Last 3 Encounters:  08/08/24 227 lb (103 kg)  05/13/24 238 lb (108 kg)  11/28/23 244 lb 12.8 oz (111 kg)    GEN: Well nourished, well developed in no acute distress NECK: No JVD; No carotid bruits CARDIAC: ***RRR, no murmurs, rubs, gallops RESPIRATORY:  Clear to auscultation without rales, wheezing or rhonchi  ABDOMEN: Soft, non-tender, non-distended EXTREMITIES:  No edema; No deformity   ASSESSMENT AND PLAN: .    Afib in setting of COPD exacerbation, no Afib on f/u Zio so eliquis  stopped  HTN  OSA  COPD  Tobacco  ETOH  Obesity     {Are you ordering a CV Procedure (e.g. stress test, cath, DCCV, TEE, etc)?   Press F2        :[id number]}  Dispo: ***  Signed, Olivia Pavy, PA-C

## 2024-09-17 ENCOUNTER — Ambulatory Visit: Attending: Physician Assistant | Admitting: Physician Assistant

## 2024-11-14 ENCOUNTER — Encounter (HOSPITAL_BASED_OUTPATIENT_CLINIC_OR_DEPARTMENT_OTHER): Payer: Self-pay
